# Patient Record
Sex: Female | Born: 1985 | Race: Black or African American | Hispanic: No | Marital: Single | Smoking: Never smoker
Health system: Southern US, Community
[De-identification: ages and names within clinical notes are randomized; demographics above are authoritative.]

## PROBLEM LIST (undated history)

## (undated) DIAGNOSIS — E611 Iron deficiency: Secondary | ICD-10-CM

## (undated) DIAGNOSIS — R7989 Other specified abnormal findings of blood chemistry: Secondary | ICD-10-CM

## (undated) DIAGNOSIS — R945 Abnormal results of liver function studies: Secondary | ICD-10-CM

## (undated) DIAGNOSIS — R768 Other specified abnormal immunological findings in serum: Secondary | ICD-10-CM

## (undated) DIAGNOSIS — D561 Beta thalassemia: Secondary | ICD-10-CM

## (undated) DIAGNOSIS — M3500 Sicca syndrome, unspecified: Secondary | ICD-10-CM

## (undated) HISTORY — DX: Sjogren syndrome, unspecified: M35.00

## (undated) HISTORY — DX: Other specified abnormal immunological findings in serum: R76.8

## (undated) HISTORY — PX: WISDOM TOOTH EXTRACTION: SHX21

## (undated) HISTORY — DX: Other specified abnormal findings of blood chemistry: R79.89

## (undated) HISTORY — DX: Abnormal results of liver function studies: R94.5

## (undated) HISTORY — DX: Beta thalassemia: D56.1

## (undated) HISTORY — DX: Iron deficiency: E61.1

---
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
## 2005-07-22 ENCOUNTER — Emergency Department (HOSPITAL_COMMUNITY): Admission: EM | Admit: 2005-07-22 | Discharge: 2005-07-22 | Payer: Self-pay | Admitting: Emergency Medicine

## 2005-12-14 ENCOUNTER — Emergency Department (HOSPITAL_COMMUNITY): Admission: EM | Admit: 2005-12-14 | Discharge: 2005-12-14 | Payer: Self-pay | Admitting: Emergency Medicine

## 2006-07-09 ENCOUNTER — Ambulatory Visit: Payer: Self-pay | Admitting: *Deleted

## 2006-07-09 ENCOUNTER — Inpatient Hospital Stay (HOSPITAL_COMMUNITY): Admission: AD | Admit: 2006-07-09 | Discharge: 2006-07-09 | Payer: Self-pay | Admitting: Obstetrics and Gynecology

## 2006-07-12 ENCOUNTER — Ambulatory Visit: Payer: Self-pay | Admitting: *Deleted

## 2006-07-12 ENCOUNTER — Inpatient Hospital Stay (HOSPITAL_COMMUNITY): Admission: AD | Admit: 2006-07-12 | Discharge: 2006-07-12 | Payer: Self-pay | Admitting: Gynecology

## 2006-10-29 ENCOUNTER — Inpatient Hospital Stay (HOSPITAL_COMMUNITY): Admission: AD | Admit: 2006-10-29 | Discharge: 2006-10-29 | Payer: Self-pay | Admitting: Family Medicine

## 2006-11-06 ENCOUNTER — Inpatient Hospital Stay (HOSPITAL_COMMUNITY): Admission: AD | Admit: 2006-11-06 | Discharge: 2006-11-08 | Payer: Self-pay | Admitting: Obstetrics and Gynecology

## 2007-11-22 ENCOUNTER — Emergency Department (HOSPITAL_COMMUNITY): Admission: EM | Admit: 2007-11-22 | Discharge: 2007-11-22 | Payer: Self-pay | Admitting: Emergency Medicine

## 2008-02-28 ENCOUNTER — Emergency Department (HOSPITAL_COMMUNITY): Admission: EM | Admit: 2008-02-28 | Discharge: 2008-02-28 | Payer: Self-pay | Admitting: Emergency Medicine

## 2008-05-06 ENCOUNTER — Emergency Department (HOSPITAL_COMMUNITY): Admission: EM | Admit: 2008-05-06 | Discharge: 2008-05-06 | Payer: Self-pay | Admitting: Emergency Medicine

## 2008-08-28 ENCOUNTER — Emergency Department (HOSPITAL_COMMUNITY): Admission: EM | Admit: 2008-08-28 | Discharge: 2008-08-28 | Payer: Self-pay | Admitting: Emergency Medicine

## 2008-12-13 ENCOUNTER — Emergency Department (HOSPITAL_BASED_OUTPATIENT_CLINIC_OR_DEPARTMENT_OTHER): Admission: EM | Admit: 2008-12-13 | Discharge: 2008-12-13 | Payer: Self-pay | Admitting: Emergency Medicine

## 2009-01-30 ENCOUNTER — Emergency Department (HOSPITAL_BASED_OUTPATIENT_CLINIC_OR_DEPARTMENT_OTHER): Admission: EM | Admit: 2009-01-30 | Discharge: 2009-01-30 | Payer: Self-pay | Admitting: Emergency Medicine

## 2009-05-31 ENCOUNTER — Emergency Department (HOSPITAL_COMMUNITY): Admission: EM | Admit: 2009-05-31 | Discharge: 2009-05-31 | Payer: Self-pay | Admitting: Emergency Medicine

## 2009-08-08 ENCOUNTER — Emergency Department (HOSPITAL_BASED_OUTPATIENT_CLINIC_OR_DEPARTMENT_OTHER): Admission: EM | Admit: 2009-08-08 | Discharge: 2009-08-08 | Payer: Self-pay | Admitting: Emergency Medicine

## 2009-10-30 ENCOUNTER — Inpatient Hospital Stay (HOSPITAL_COMMUNITY): Admission: AD | Admit: 2009-10-30 | Discharge: 2009-10-30 | Payer: Self-pay | Admitting: Obstetrics and Gynecology

## 2009-12-12 LAB — HM PAP SMEAR: HM Pap smear: NEGATIVE

## 2010-06-24 ENCOUNTER — Inpatient Hospital Stay (HOSPITAL_COMMUNITY): Admission: AD | Admit: 2010-06-24 | Discharge: 2010-06-26 | Payer: Self-pay | Admitting: Obstetrics and Gynecology

## 2010-09-13 ENCOUNTER — Emergency Department (HOSPITAL_COMMUNITY): Admission: EM | Admit: 2010-09-13 | Discharge: 2010-09-13 | Payer: Self-pay | Admitting: Emergency Medicine

## 2011-01-26 LAB — CBC
Hemoglobin: 11.6 g/dL — ABNORMAL LOW (ref 12.0–15.0)
MCH: 23.4 pg — ABNORMAL LOW (ref 26.0–34.0)
MCH: 23.5 pg — ABNORMAL LOW (ref 26.0–34.0)
MCHC: 32.1 g/dL (ref 30.0–36.0)
MCHC: 32.5 g/dL (ref 30.0–36.0)
MCV: 72 fL — ABNORMAL LOW (ref 78.0–100.0)
Platelets: 252 10*3/uL (ref 150–400)
Platelets: 256 10*3/uL (ref 150–400)
RBC: 4.74 MIL/uL (ref 3.87–5.11)
RDW: 15.7 % — ABNORMAL HIGH (ref 11.5–15.5)
RDW: 15.7 % — ABNORMAL HIGH (ref 11.5–15.5)

## 2011-01-26 LAB — RPR: RPR Ser Ql: NONREACTIVE

## 2011-02-12 LAB — GC/CHLAMYDIA PROBE AMP, GENITAL
Chlamydia, DNA Probe: NEGATIVE
GC Probe Amp, Genital: NEGATIVE

## 2011-02-12 LAB — CBC
Hemoglobin: 11.3 g/dL — ABNORMAL LOW (ref 12.0–15.0)
MCHC: 32.4 g/dL (ref 30.0–36.0)
Platelets: 291 10*3/uL (ref 150–400)
RDW: 15 % (ref 11.5–15.5)

## 2011-02-12 LAB — WET PREP, GENITAL: Clue Cells Wet Prep HPF POC: NONE SEEN

## 2011-02-12 LAB — ABO/RH: ABO/RH(D): O POS

## 2011-02-12 LAB — POCT PREGNANCY, URINE: Preg Test, Ur: POSITIVE

## 2011-02-16 LAB — CBC
HCT: 36.2 % (ref 36.0–46.0)
MCV: 68.7 fL — ABNORMAL LOW (ref 78.0–100.0)
Platelets: 309 10*3/uL (ref 150–400)
RDW: 14.4 % (ref 11.5–15.5)

## 2011-02-16 LAB — URINALYSIS, ROUTINE W REFLEX MICROSCOPIC
Glucose, UA: NEGATIVE mg/dL
Protein, ur: NEGATIVE mg/dL
Specific Gravity, Urine: 1.028 (ref 1.005–1.030)
Urobilinogen, UA: 1 mg/dL (ref 0.0–1.0)

## 2011-02-16 LAB — DIFFERENTIAL
Basophils Absolute: 0.1 10*3/uL (ref 0.0–0.1)
Basophils Relative: 2 % — ABNORMAL HIGH (ref 0–1)
Eosinophils Relative: 1 % (ref 0–5)
Lymphocytes Relative: 28 % (ref 12–46)
Monocytes Relative: 6 % (ref 3–12)
Neutro Abs: 3.8 10*3/uL (ref 1.7–7.7)

## 2011-02-16 LAB — COMPREHENSIVE METABOLIC PANEL
Albumin: 4.3 g/dL (ref 3.5–5.2)
BUN: 9 mg/dL (ref 6–23)
Creatinine, Ser: 0.7 mg/dL (ref 0.4–1.2)
GFR calc Af Amer: >60 mL/min (ref >60)
Total Protein: 8 g/dL (ref 6.0–8.3)

## 2011-02-16 LAB — URINE MICROSCOPIC-ADD ON

## 2011-02-16 LAB — WET PREP, GENITAL

## 2011-02-16 LAB — GC/CHLAMYDIA PROBE AMP, GENITAL
Chlamydia, DNA Probe: NEGATIVE
GC Probe Amp, Genital: NEGATIVE

## 2011-02-22 LAB — COMPREHENSIVE METABOLIC PANEL
ALT: 47 U/L — ABNORMAL HIGH (ref 0–35)
BUN: 8 mg/dL (ref 6–23)
Calcium: 8.9 mg/dL (ref 8.4–10.5)
Glucose, Bld: 80 mg/dL (ref 70–99)
Sodium: 140 mEq/L (ref 135–145)
Total Protein: 8 g/dL (ref 6.0–8.3)

## 2011-02-22 LAB — GC/CHLAMYDIA PROBE AMP, GENITAL
Chlamydia, DNA Probe: NEGATIVE
GC Probe Amp, Genital: NEGATIVE

## 2011-02-22 LAB — URINALYSIS, ROUTINE W REFLEX MICROSCOPIC
Bilirubin Urine: NEGATIVE
Ketones, ur: NEGATIVE mg/dL
Nitrite: NEGATIVE
pH: 6.5 (ref 5.0–8.0)

## 2011-02-22 LAB — CBC
Hemoglobin: 12.1 g/dL (ref 12.0–15.0)
MCHC: 32.2 g/dL (ref 30.0–36.0)
RDW: 14.3 % (ref 11.5–15.5)

## 2011-02-22 LAB — DIFFERENTIAL
Lymphocytes Relative: 18 % (ref 12–46)
Lymphs Abs: 1.2 10*3/uL (ref 0.7–4.0)
Monocytes Relative: 5 % (ref 3–12)
Neutro Abs: 5.1 10*3/uL (ref 1.7–7.7)
Neutrophils Relative %: 76 % (ref 43–77)

## 2011-02-22 LAB — LIPASE, BLOOD: Lipase: 25 U/L (ref 23–300)

## 2011-02-22 LAB — WET PREP, GENITAL

## 2011-02-22 LAB — PREGNANCY, URINE: Preg Test, Ur: NEGATIVE

## 2011-08-02 LAB — URINALYSIS, ROUTINE W REFLEX MICROSCOPIC
Glucose, UA: NEGATIVE
Nitrite: NEGATIVE
Protein, ur: NEGATIVE
pH: 6

## 2011-08-02 LAB — URINE MICROSCOPIC-ADD ON

## 2011-08-14 LAB — URINE MICROSCOPIC-ADD ON

## 2011-08-14 LAB — URINALYSIS, ROUTINE W REFLEX MICROSCOPIC
Bilirubin Urine: NEGATIVE
Glucose, UA: NEGATIVE
Hgb urine dipstick: NEGATIVE
Specific Gravity, Urine: 1.04 — ABNORMAL HIGH
Urobilinogen, UA: 0.2

## 2011-08-14 LAB — POCT I-STAT, CHEM 8
BUN: 10
Calcium, Ion: 1.18
Chloride: 106
Creatinine, Ser: 0.8
Glucose, Bld: 105 — ABNORMAL HIGH
Potassium: 3.6

## 2011-08-14 LAB — HEPATIC FUNCTION PANEL
ALT: 43 — ABNORMAL HIGH
AST: 26
Alkaline Phosphatase: 48
Bilirubin, Direct: <0.1

## 2011-08-14 LAB — URINE CULTURE

## 2011-08-14 LAB — PREGNANCY, URINE: Preg Test, Ur: NEGATIVE

## 2012-01-04 DIAGNOSIS — D561 Beta thalassemia: Secondary | ICD-10-CM | POA: Insufficient documentation

## 2012-01-14 ENCOUNTER — Telehealth: Payer: Self-pay | Admitting: Oncology

## 2012-01-14 NOTE — Telephone Encounter (Signed)
called pts scheduled appt pt for 03/21.  will fax a letter to Dr. Daphine Deutscher with appt d/t

## 2012-01-15 ENCOUNTER — Telehealth: Payer: Self-pay | Admitting: Oncology

## 2012-01-15 NOTE — Telephone Encounter (Signed)
Del. Ref.Cameron Sprang NP Dx.Poss. Thalassemia

## 2012-01-24 ENCOUNTER — Encounter: Payer: Self-pay | Admitting: Oncology

## 2012-01-24 DIAGNOSIS — M3500 Sicca syndrome, unspecified: Secondary | ICD-10-CM | POA: Insufficient documentation

## 2012-01-24 DIAGNOSIS — D561 Beta thalassemia: Secondary | ICD-10-CM | POA: Insufficient documentation

## 2012-01-24 DIAGNOSIS — E559 Vitamin D deficiency, unspecified: Secondary | ICD-10-CM | POA: Insufficient documentation

## 2012-01-24 DIAGNOSIS — R7989 Other specified abnormal findings of blood chemistry: Secondary | ICD-10-CM | POA: Insufficient documentation

## 2012-01-24 DIAGNOSIS — G43909 Migraine, unspecified, not intractable, without status migrainosus: Secondary | ICD-10-CM | POA: Insufficient documentation

## 2012-01-24 DIAGNOSIS — R768 Other specified abnormal immunological findings in serum: Secondary | ICD-10-CM | POA: Insufficient documentation

## 2012-01-31 ENCOUNTER — Ambulatory Visit (HOSPITAL_BASED_OUTPATIENT_CLINIC_OR_DEPARTMENT_OTHER): Payer: Medicaid Other | Admitting: Lab

## 2012-01-31 ENCOUNTER — Encounter: Payer: Self-pay | Admitting: Oncology

## 2012-01-31 ENCOUNTER — Ambulatory Visit: Payer: Self-pay

## 2012-01-31 ENCOUNTER — Ambulatory Visit (HOSPITAL_BASED_OUTPATIENT_CLINIC_OR_DEPARTMENT_OTHER): Payer: Medicaid Other | Admitting: Oncology

## 2012-01-31 DIAGNOSIS — E611 Iron deficiency: Secondary | ICD-10-CM

## 2012-01-31 DIAGNOSIS — R7989 Other specified abnormal findings of blood chemistry: Secondary | ICD-10-CM

## 2012-01-31 DIAGNOSIS — R768 Other specified abnormal immunological findings in serum: Secondary | ICD-10-CM

## 2012-01-31 DIAGNOSIS — Z862 Personal history of diseases of the blood and blood-forming organs and certain disorders involving the immune mechanism: Secondary | ICD-10-CM

## 2012-01-31 DIAGNOSIS — G43909 Migraine, unspecified, not intractable, without status migrainosus: Secondary | ICD-10-CM

## 2012-01-31 DIAGNOSIS — D561 Beta thalassemia: Secondary | ICD-10-CM

## 2012-01-31 LAB — CBC WITH DIFFERENTIAL/PLATELET
Basophils Absolute: 0 10*3/uL (ref 0.0–0.1)
EOS%: 1.1 % (ref 0.0–7.0)
Eosinophils Absolute: 0 10*3/uL (ref 0.0–0.5)
LYMPH%: 33 % (ref 14.0–49.7)
MCH: 21.6 pg — ABNORMAL LOW (ref 25.1–34.0)
MCV: 66.7 fL — ABNORMAL LOW (ref 79.5–101.0)
MONO%: 6.5 % (ref 0.0–14.0)
Platelets: 288 10*3/uL (ref 145–400)
RBC: 5.46 10*6/uL — ABNORMAL HIGH (ref 3.70–5.45)
RDW: 14.6 % — ABNORMAL HIGH (ref 11.2–14.5)
nRBC: 0 % (ref 0–0)

## 2012-01-31 LAB — MORPHOLOGY: PLT EST: ADEQUATE

## 2012-01-31 LAB — CHCC SMEAR

## 2012-01-31 NOTE — Progress Notes (Signed)
[facility] INITIAL HEMATOLOGY CONSULTATION  Referral MD:  Caprice Kluver, F.N.P.   Reason for Referral: beta thal.     HPI: [name] is a 26 year-old [place] American woman with family and personal history of beta thal.  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  She has never required pRBC transfusion.  She was in [facility] until 08/2011 when she developed fatigue, fever, nausea/vomiting.  She was seen by her PCP and  LFT was obtained which showed transaminitis.  She had extensive work up which was reportedly negative for hepatitis, normal abdominal US.  Her symptoms eventually resolved.  She just recently told her PCP about her diagnosis of beta thal.  Given this history and recent abnormal LFT, she was kindly referred for evaluation.  [name] presented to clinic by herself today.  She reported mild fatigue which has improved compared to 08/2011.  She is still able to take care of two young children and go to school.  She has intermittent dizziness.   Patient denies headache, visual changes, confusion, drenching night sweats, palpable lymph node swelling, mucositis, odynophagia, dysphagia, nausea vomiting, jaundice, chest pain, palpitation, shortness of breath, dyspnea on exertion, productive cough, gum bleeding, epistaxis, hematemesis, hemoptysis, abdominal pain, abdominal swelling, early satiety, melena, hematochezia, hematuria, skin rash, jaundice, spontaneous bleeding, joint swelling, joint pain, heat or cold intolerance, bowel bladder incontinence, back pain, focal motor weakness, paresthesia, depression, suicidal or homocidal ideation, feeling hopelessness.  Past Medical History  Diagnosis Date  . Migraine   . Vitamin d deficiency   . Beta thalassemia   . LFT elevation     unclear etiology; negative work up in 08/2011.   Marland Kitchen Positive ANA (antinuclear antibody)   Her positive ANA resolved when she was evaluated by a rheumatologist.      Past  Surgical History  Procedure Date  . Wisdom tooth extraction   :   CURRENT MEDS: Current Outpatient Prescriptions  Medication Sig Dispense Refill  . aspirin-acetaminophen-caffeine (EXCEDRIN MIGRAINE) 250-250-65 MG per tablet Take 1 tablet by mouth every 6 (six) hours as needed.      Marland Kitchen ibuprofen (ADVIL,MOTRIN) 200 MG tablet Take 200 mg by mouth every 6 (six) hours as needed.      Marland Kitchen buPROPion (WELLBUTRIN SR) 100 MG 12 hr tablet Take 100 mg by mouth 2 (two) times daily.          Allergies  Allergen Reactions  . Percocet (Oxycodone-Acetaminophen) Rash  :  Family History  Problem Relation Age of Onset  . Thalassemia Mother   . Thalassemia Brother   . Cancer Maternal Grandmother     cervical cancer  :  History   Social History  . Marital Status: Single    Spouse Name: N/A    Number of Children: 2  . Years of Education: N/A   Occupational History  .      in school for [employer]   Social History Main Topics  . Smoking status: Never Smoker   . Smokeless tobacco: Never Used  . Alcohol Use: No  . Drug Use: No  . Sexually Active: No   Other Topics Concern  . Not on file   Social History Narrative  . No narrative on file    REVIEW OF SYSTEM:  The rest of the 14-point review of sytem was negative.   Exam:   General:  well-nourished in no acute distress.  Eyes:  no scleral icterus.  ENT:  There  were no oropharyngeal lesions.  Neck was without thyromegaly.  Lymphatics:  Negative cervical, supraclavicular or axillary adenopathy.  Respiratory: lungs were clear bilaterally without wheezing or crackles.  Cardiovascular:  Regular rate and rhythm, S1/S2, without murmur, rub or gallop.  There was no pedal edema.  GI:  abdomen was soft, flat, nontender, nondistended, without organomegaly.  Muscoloskeletal:  no spinal tenderness of palpation of vertebral spine.  Skin exam was without echymosis, petichae.  Neuro exam was nonfocal.  Patient was able to get on and off exam table  without assistance.  Gait was normal.  Patient was alerted and oriented.  Attention was good.   Language was appropriate.  Mood was normal without depression.  Speech was not pressured.  Thought content was not tangential.    LABS:  Lab Results  Component Value Date   WBC 3.7* 01/31/2012   HGB 11.8 01/31/2012   HCT 36.4 01/31/2012   PLT 288 01/31/2012   GLUCOSE 83 01/31/2012   ALT 10 01/31/2012   AST 18 01/31/2012   NA 138 01/31/2012   K 4.1 01/31/2012   CL 104 01/31/2012   CREATININE 0.65 01/31/2012   BUN 10 01/31/2012   CO2 26 01/31/2012   Blood smear review:   I personally reviewed the patient's peripheral blood smear today.  There was microcytosis and increased central pallor.  There was no peripheral blast.  There was no schistocytosis, spherocytosis, target cell, rouleaux formation, tear drop cell.  There was no giant platelets or platelet clumps.     ASSESSMENT AND PLAN:   1.  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    Diagnosis: She does not remember the doctor who gave her this diagnosis, nor does she have record of past testing.  Therefore, I sent for hemoglobin electrophoresis today to ensure that she has this disease.    Treatment:  No specific treatment is indicated in patient with beta thal minor who has no pRBC transfusion requirement.  2.  Recent abnormal LFT:  Patients with beta thal intermedia (symptomatic betal thal  who does not requires pRBC transfusion) can still develop iron overload due to increased absorption.  Her LFT abnormality has resolved.  However, in patients with beta thal trait/carrier, iron overload  is often not the case.   Work up:  I sent for iron panel today to ensure that she does not have iron overload.   Recommend:  To PCP to follow yearly LFT and iron panel.  If she develops iron overload, then she needs to be referred back to Hematology for iron chelation therapy.  If LFT becomes  abnormal again, then she needs to be refer to Liver clinic.  Without LFT abnormality, there is no indication for surveillance liver US.  If she develops cirrhosis, then she will need to be monitored with yearly liver US to screen for hepatocellular carcinoma.    3.  History of iron deficiency but no current anemia:  from most likely menstrual blood loss.  She denies dysmenorrhea.  I have iron panel pending today.  4.  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  She was discharged back to PCP.  Our service is available in the future if the need arises.   Thank you for this opportunity for me to participate in this patient's care.

## 2012-01-31 NOTE — Progress Notes (Signed)
Patient came in today as a new patient,I met with her,and told her about our financial assistance program,but she said she was [place] because she has Medicaid.

## 2012-02-01 ENCOUNTER — Other Ambulatory Visit: Payer: Self-pay | Admitting: Oncology

## 2012-02-01 DIAGNOSIS — R768 Other specified abnormal immunological findings in serum: Secondary | ICD-10-CM

## 2012-02-01 DIAGNOSIS — D561 Beta thalassemia: Secondary | ICD-10-CM

## 2012-02-01 DIAGNOSIS — E611 Iron deficiency: Secondary | ICD-10-CM

## 2012-02-01 DIAGNOSIS — R7989 Other specified abnormal findings of blood chemistry: Secondary | ICD-10-CM

## 2012-02-02 LAB — IRON AND TIBC
%SAT: 17 % — ABNORMAL LOW (ref 20–55)
Iron: 51 ug/dL (ref 42–145)
TIBC: 292 ug/dL (ref 250–470)
UIBC: 241 ug/dL (ref 125–400)

## 2012-02-04 LAB — COMPREHENSIVE METABOLIC PANEL
ALT: 10 U/L (ref 0–35)
AST: 18 U/L (ref 0–37)
Albumin: 4.4 g/dL (ref 3.5–5.2)
CO2: 26 mEq/L (ref 19–32)
Calcium: 9.3 mg/dL (ref 8.4–10.5)
Chloride: 104 mEq/L (ref 96–112)
Creatinine, Ser: 0.65 mg/dL (ref 0.50–1.10)
Potassium: 4.1 mEq/L (ref 3.5–5.3)

## 2012-02-04 LAB — FERRITIN: Ferritin: 184 ng/mL (ref 10–291)

## 2014-01-03 ENCOUNTER — Emergency Department (HOSPITAL_COMMUNITY): Payer: Medicaid Other

## 2014-01-03 ENCOUNTER — Emergency Department (HOSPITAL_COMMUNITY)
Admission: EM | Admit: 2014-01-03 | Discharge: 2014-01-03 | Disposition: A | Discharge status: Home / Self Care | Payer: Medicaid Other | Attending: Emergency Medicine | Admitting: Emergency Medicine

## 2014-01-03 ENCOUNTER — Encounter (HOSPITAL_COMMUNITY): Payer: Self-pay | Admitting: Emergency Medicine

## 2014-01-03 DIAGNOSIS — G43909 Migraine, unspecified, not intractable, without status migrainosus: Secondary | ICD-10-CM | POA: Insufficient documentation

## 2014-01-03 DIAGNOSIS — Z8639 Personal history of other endocrine, nutritional and metabolic disease: Secondary | ICD-10-CM | POA: Insufficient documentation

## 2014-01-03 DIAGNOSIS — M47817 Spondylosis without myelopathy or radiculopathy, lumbosacral region: Secondary | ICD-10-CM | POA: Insufficient documentation

## 2014-01-03 DIAGNOSIS — M4306 Spondylolysis, lumbar region: Secondary | ICD-10-CM

## 2014-01-03 DIAGNOSIS — Z862 Personal history of diseases of the blood and blood-forming organs and certain disorders involving the immune mechanism: Secondary | ICD-10-CM | POA: Insufficient documentation

## 2014-01-03 DIAGNOSIS — Z79899 Other long term (current) drug therapy: Secondary | ICD-10-CM | POA: Insufficient documentation

## 2014-01-03 DIAGNOSIS — R209 Unspecified disturbances of skin sensation: Secondary | ICD-10-CM | POA: Insufficient documentation

## 2014-01-03 DIAGNOSIS — IMO0002 Reserved for concepts with insufficient information to code with codable children: Secondary | ICD-10-CM | POA: Insufficient documentation

## 2014-01-03 DIAGNOSIS — Z791 Long term (current) use of non-steroidal anti-inflammatories (NSAID): Secondary | ICD-10-CM | POA: Insufficient documentation

## 2014-01-03 DIAGNOSIS — M79609 Pain in unspecified limb: Secondary | ICD-10-CM | POA: Insufficient documentation

## 2014-01-03 MED ORDER — PREDNISONE 10 MG PO TABS: 20.0000 mg | ORAL_TABLET | Freq: Every day | ORAL | Status: DC

## 2014-01-03 MED ORDER — CYCLOBENZAPRINE HCL 10 MG PO TABS: 10.0000 mg | ORAL_TABLET | Freq: Two times a day (BID) | ORAL | Status: DC | PRN

## 2014-01-03 MED ORDER — KETOROLAC TROMETHAMINE 60 MG/2ML IM SOLN
60.0000 mg | Freq: Once | INTRAMUSCULAR | Status: AC
  Administered 2014-01-03: 60 mg via INTRAMUSCULAR
  Filled 2014-01-03: qty 2

## 2014-01-03 NOTE — ED Notes (Signed)
Pt c/o constant pain to right leg and intermittent low back pain ongoing for 1 week. Pt denies injury.

## 2014-01-03 NOTE — Discharge Instructions (Signed)
Spondylolysis with Rehab Spondylolysis is a condition of the spine that is characterized by stress fracture of one or more vertebrae. These stress fractures occur in an area of the vertebrae between the facet joints (pons inter-articularis). Facet joints are what enable the spine to move and pivot.  SYMPTOMS   Dull, achy pain in the lower back. Pain that worsens with extension of the spine.  Tightness of the muscles on the back of the thigh.  Lower back stiffness. CAUSES  Stress fractures occur when repetitive stress damages a bone faster than the bone can heal. Common mechanisms of injury for spondylolysis include:  Repetitive extension beyond normal limits of the spine (hyperextension).  Repetitive excessive rotation of the spine.  Direct trauma (uncommon). RISK INCREASES WITH:  Activities that have a risk of hyper-extending the back.  Activities that have a risk of excessively rotating the spine.  Poor strength and flexibility  Failure to warm-up properly before activity.  Family history of spondylolysis or slippage or movement of one vertebra on another (spondylolisthesis).  Improper sports technique. PREVENTION  Use proper technique.  Wear proper protective equipment and ensure correct fit.  Appropriately warm up and stretch before practice or competition.  Maintain appropriate conditioning:  Back and hamstring flexibility.  Back muscle strength and endurance.  Cardiovascular fitness. PROGNOSIS  If treated properly, then spondylolysis usually heals within 6 weeks of non-surgical (conservative) treatment. RELATED COMPLICATIONS   Recurrent symptoms that result in a chronic problem.  Inability to compete in athletics.  Prolonged healing time, if improperly treated or re-injured.  Failure of the fracture to heal (nonunion)  Healing of the fracture in a poor position (malunion).  Progression to spondylolisthesis. TREATMENT  Treatment initially involves  resting from any activities that aggravate the symptoms, and the use of ice and medications to help reduce pain and inflammation. The use of strengthening and stretching exercises may help reduce pain with activity. These exercises may be performed at home or with referral to a therapist. It is important to learn how to use proper body mechanics so undue stress is not placed on your spine. If the injury is severe, then your caregiver may recommend a back brace to allow for healing or even surgery. Surgery often involves fusing two adjacent vertebrae, so no movement is allowed between them. Surgery is rarely performed for this condition; however, if symptoms persist for greater than 12 months despite non-surgical (conservative) treatment, then surgery may be recommended. MEDICATION   If pain medication is necessary, then nonsteroidal anti-inflammatory medications, such as aspirin and ibuprofen, or other minor pain relievers, such as acetaminophen, are often recommended.  Do not take pain medication for 7 days before surgery.  Prescription pain relievers may be given if deemed necessary by your caregiver. Use only as directed and only as much as you need. HEAT AND COLD:  Cold treatment (icing) relieves pain and reduces inflammation. Cold treatment should be applied for 10 to 15 minutes every 2 to 3 hours for inflammation and pain and immediately after any activity that aggravates your symptoms. Use ice packs or massage the area with a piece of ice (ice massage).  Heat treatment may be used prior to performing the stretching and strengthening activities prescribed by your caregiver, physical therapist, or athletic trainer. Use a heat pack or soak your injury in warm water. SEEK MEDICAL CARE IF:  Treatment seems to offer no benefit, or the condition worsens.  Any medications produce adverse side effects. EXERCISES RANGE OF MOTION (ROM) AND  STRETCHING EXERCISES - Spondylolysis Most people with low  back pain will find that their symptoms worsen with either excessive bending forward (flexion) or arching at the low back (extension). The exercises which will help resolve your symptoms will focus on the opposite motion. Your physician, physical therapist or athletic trainer will help you determine which exercises will be most helpful to resolve your low back pain. Do not complete any exercises without first consulting with your clinician. Discontinue any exercises which worsen your symptoms until you speak to your clinician. You may have pain, numbness or tingling which travels down into your buttocks, leg or foot.  The goal of the therapy is to get these symptoms to recede to your back and eventually resolve. Occasionally, these leg symptoms will get better, but your low back pain may worsen; this is typically an indication of progress in your rehabilitation. Be certain to be very alert to any changes in your symptoms and the activities in which you participated in the 24 hours prior to the change. Sharing this information with your clinician will allow him/her to most efficiently treat your condition. These exercises may help you when beginning to rehabilitate your injury. Your symptoms may resolve with or without further involvement from your physician, physical therapist or athletic trainer. While completing these exercises, remember:   Restoring tissue flexibility helps normal motion to return to the joints. This allows healthier, less painful movement and activity.  An effective stretch should be held for at least 30 seconds.  A stretch should never be painful. You should only feel a gentle lengthening or release in the stretched tissue. FLEXION RANGE OF MOTION AND STRETCHING EXERCISES: STRETCH  Flexion, Single Knee to Chest  Lie on a firm bed or the floor with both legs extended in front of you.  Keeping one leg in contact with the floor, bring your opposite knee to your chest. Hold your leg  in place by either grabbing behind your thigh or at your knee.  Pull until you feel a gentle stretch in your low back. Hold __________ seconds.  Slowly release your grasp and repeat the exercise with the opposite side. Repeat __________ times. Complete this exercise __________ times per day.  STRETCH  Flexion, Double Knee to Chest  Lie on a firm bed or the floor with both legs extended in front of you.  Keeping one leg in contact with the floor, bring your opposite knee to your chest.  Tense your stomach muscles to support your back and then lift your other knee to your chest. Hold your legs in place by either grabbing behind your thighs or at your knees.  Pull both knees toward your chest until you feel a gentle stretch in your low back. Hold __________ seconds.  Tense your stomach muscles and slowly return one leg at a time to the floor. Repeat __________ times. Complete this exercise __________ times per day.  STRENGTHENING EXERCISES - Spondylolysis These exercises may help you when beginning to rehabilitate your injury. These exercises should be done near your "sweet spot." This is the neutral, low-back arch, somewhere between fully rounded and fully arched, that is your least painful position. When performed in this safe range of motion, these exercises can be used for people who have either a flexion or extension based injury. These exercises may resolve your symptoms with or without further involvement from your physician, physical therapist or athletic trainer. While completing these exercises, remember:   Muscles can gain both the endurance and  the strength needed for everyday activities through controlled exercises.  Complete these exercises as instructed by your physician, physical therapist or athletic trainer. Progress with the resistance and repetition exercises only as your caregiver advises.  You may experience muscle soreness or fatigue, but the pain or discomfort you are  trying to eliminate should never worsen during these exercises. If this pain does worsen, stop and make certain you are following the directions exactly. If the pain is still present after adjustments, discontinue the exercise until you can discuss the trouble with your clinician. STRENGTHENING Deep Abdominals, Pelvic Tilt   Lie on a firm bed or the floor. Keeping your legs in front of you, bend your knees so they are both pointed toward the ceiling and your feet are flat on the floor.  Tense your lower abdominal muscles to press your low back into the floor. This motion will rotate your pelvis so that your tail bone is scooping upwards rather than pointing at your feet or into the floor.  With a gentle tension and even breathing, hold this position for __________ seconds. Repeat __________ times. Complete this exercise __________ times per day.  STRENGTHENING  Abdominals, Crunches  Lie on a firm bed or the floor. Keeping your legs in front of you, bend your knees so they are both pointed toward the ceiling and your feet are flat on the floor. Cross your arms over your chest.  Slightly tip your chin down without bending your neck.  Tense your abdominals and slowly lift your trunk high enough to just clear your shoulder blades. Lifting higher can put excessive stress on the low back and does not further strengthen your abdominal muscles.  Control your return to the starting position. Repeat __________ times. Complete this exercise __________ times per day.  STRENGTHENING  Quadruped, Opposite UE/LE Lift  Assume a hands and knees position on a firm surface. Keep your hands under your shoulders and your knees under your hips. You may place padding under your knees for comfort.  Find your neutral spine and gently tense your abdominal muscles so that you can maintain this position. Your shoulders and hips should form a rectangle that is parallel with the floor and is not twisted.  Keeping your  trunk steady, lift your right hand no higher than your shoulder and then your left leg no higher than your hip. Make sure you are not holding your breath. Hold this position __________ seconds.  Continuing to keep your abdominal muscles tense and your back steady, slowly return to your starting position. Repeat with the opposite arm and leg. Repeat __________ times. Complete this exercise __________ times per day.  STRENGTHENING  Lower Abdominals, Double Knee Lift  Lie on a firm bed or the floor. Keeping your legs in front of you, bend your knees so they are both pointed toward the ceiling and your feet are flat on the floor.  Tense your abdominal muscles to brace your low back and slowly lift both of your knees until they come over your hips. Be certain not to hold your breath.  Hold __________ seconds. Using your abdominal muscles, return to the starting position in a slow and controlled manner. Repeat __________ times. Complete this exercise __________ times per day.  POSTURE AND BODY MECHANICS CONSIDERATIONS - Spondylolysis Keeping correct posture when sitting, standing or completing your activities will reduce the stress put on different body tissues, allowing injured tissues a chance to heal and limiting painful experiences. The following are general guidelines  for improved posture. Your physician or physical therapist will provide you with any instructions specific to your needs. While reading these guidelines, remember:  The exercises prescribed by your provider will help you have the flexibility and strength to maintain correct postures.  The correct posture provides the optimal environment for your joints to work. All of your joints have less wear and tear when properly supported by a spine with good posture. This means you will experience a healthier, less painful body.  Correct posture must be practiced with all of your activities, especially prolonged sitting and standing. Correct  posture is as important when doing repetitive low-stress activities (typing) as it is when doing a single heavy-load activity (lifting). PROPER SITTING POSTURE In order to minimize stress and discomfort on your spine, you must sit with correct posture. Sitting with good posture should be effortless for a healthy body. Returning to good posture is a gradual process. Many people can work toward this most comfortably by using various supports until they have the flexibility and strength to maintain this posture on their own. When sitting with proper posture, your ears will fall over your shoulders and your shoulders will fall over your hips. You should use the back of the chair to support your upper back. Your low back will be in a neutral position, just slightly arched. You may place a small pillow or folded towel at the base of your low back for support.  When working at a desk, create an environment that supports good, upright posture. Without extra support, muscles fatigue and lead to excessive strain on joints and other tissues. Keep these recommendations in mind: CHAIR:  A chair should be able to slide under your desk when your back makes contact with the back of the chair. This allows you to work closely.  The chair's height should allow your eyes to be level with the upper part of your monitor and your hands to be slightly lower than your elbows. BODY POSITION  Your feet should make contact with the floor. If this is not possible, use a foot rest.  Keep your ears over your shoulders. This will reduce stress on your neck and low back. INCORRECTSITTING POSTURES If you are feeling tired and unable to assume a healthy sitting posture, do not slouch or slump. This puts excessive strain on your back tissues, causing more damage and pain. Healthier options include:  Using more support, like a lumbar pillow.  Switching tasks to something that requires you to be upright or walking.  Talking a brief  walk.  Lying down to rest in a neutral-spine position. CORRECT LIFTING TECHNIQUES DO :   Assume a wide stance. This will provide you more stability and the opportunity to get as close as possible to the object which you are lifting.  Tense your abdominals to brace your spine; then bend at the knees and hips. Keeping your back locked in a neutral-spine position, lift using your leg muscles. Lift with your legs, keeping your back straight.  Test the weight of unknown objects before attempting to lift them.  Try to keep your elbows locked down at your sides in order get the best strength from your shoulders when carrying an object.  Always ask for help when lifting heavy or awkward objects. INCORRECT LIFTING TECHNIQUES DO NOT:   Lock your knees when lifting, even if it is a small object.  Bend and twist. Pivot at your feet or move your feet when needing to change directions.  Assume that you cannot safely pick up a paperclip without proper posture. Document Released: 10/29/2005 Document Revised: 02/23/2013 Document Reviewed: 02/10/2009 [facility] Patient Information 2014 [employer], [employer].

## 2014-01-03 NOTE — ED Notes (Signed)
Patient transported to X-ray 

## 2014-01-03 NOTE — ED Provider Notes (Signed)
Medical screening examination/treatment/procedure(s) were performed by non-physician practitioner and as supervising physician I was immediately available for consultation/collaboration.  EKG Interpretation   None       Devoria AlbeIva Donavyn Fecher, MD, Armando GangFACEP   Ward GivensIva L Tiffney Haughton, MD 01/03/14 2002

## 2014-01-03 NOTE — ED Provider Notes (Signed)
CSN: [id number]     Arrival date & time 01/03/14  1721 History  This chart was scribed for non-physician practitioner Marlon Peliffany Ngan Qualls working with Ward GivensIva L Knapp, MD by Carl Bestelina Holson, ED Scribe. This patient was seen in room TR09C/TR09C and the patient's care was started at 6:10 PM.     Chief Complaint  Patient presents with  . Leg Pain  . Back Pain     The history is provided by the patient. No language interpreter was used.   HPI Comments: Jill Hobbs is a 28 y.o. female who presents to the Emergency Department complaining of constant, throbbing, right leg and intermittent, sharp lower back pain that started 8 days ago while the patient was sitting at her desk at work. 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 The patient states that her symptoms started with throbbing leg pain located in her right thigh.  The patient states that she has experienced numbness in her legs bilaterally that has inhibited her from walking but it has not happened in the past few days and she is not currently experiencing those symptoms.  She denies loss of bladder or urine function, dysuria, vomiting, diarrhea, and rash as associated symptoms.  The patient states that she has rotated Tylenol and Ibuprofen with no relief to her symptoms.  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  She denies any recent traumas that would cause her symptoms.    Past Medical History  Diagnosis Date  . Migraine   . Vitamin D deficiency   . 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 LFT elevation     unclear etiology; negative work up in 08/2011.   Marland Kitchen. Positive ANA (antinuclear antibody)     resolved when she was evaluated by Rheumatology   Past Surgical History  Procedure Laterality Date  . Wisdom tooth extraction     Family History  Problem Relation Age of Onset  . Thalassemia Mother   . Thalassemia Brother   . Cancer Maternal Grandmother     cervical  cancer   History  Substance Use Topics  . Smoking status: Never Smoker   . Smokeless tobacco: Never Used  . Alcohol Use: No   OB History   Grav Para Term Preterm Abortions TAB SAB Ect Mult Living                 Review of Systems  Gastrointestinal: Negative for vomiting and diarrhea.  Genitourinary: Negative for dysuria.  Musculoskeletal: Positive for arthralgias (right leg) and back pain (lower).  Skin: Negative for rash.  Neurological: Positive for numbness (bilateral legs).  All other systems reviewed and are negative.      Allergies  Percocet  Home Medications   Current Outpatient Rx  Name  Route  Sig  Dispense  Refill  . acetaminophen (TYLENOL) 500 MG tablet   Oral   Take 1,000 mg by mouth daily as needed for mild pain.         Marland Kitchen. aspirin-acetaminophen-caffeine (EXCEDRIN MIGRAINE) 250-250-65 MG per tablet   Oral   Take 1 tablet by mouth every 6 (six) hours as needed for headache.          . ibuprofen (ADVIL,MOTRIN) 200 MG tablet   Oral   Take 400 mg by mouth daily as needed for mild pain.          . cyclobenzaprine (FLEXERIL) 10 MG tablet  Oral   Take 1 tablet (10 mg total) by mouth 2 (two) times daily as needed for muscle spasms.   20 tablet   0   . predniSONE (DELTASONE) 10 MG tablet   Oral   Take 2 tablets (20 mg total) by mouth daily.   21 tablet   0     Prednisone dose pack directions:   6 tabs on day ...    Triage Vitals: BP 131/76  Pulse 98  Temp(Src) 98.8 F (37.1 C) (Oral)  Resp 20  Ht 5\' 1"  (1.549 m)  Wt 170 lb (77.111 kg)  BMI 32.14 kg/m2  SpO2 99%  LMP 12/13/2013  Physical Exam  Nursing note and vitals reviewed. Constitutional: She is oriented to person, place, and time. She appears well-developed and well-nourished. No distress.  HENT:  Head: Normocephalic and atraumatic.  Eyes: EOM are normal. Pupils are equal, round, and reactive to light.  Neck: Normal range of motion. Neck supple.  Cardiovascular: Normal rate  and regular rhythm.   Pulmonary/Chest: Effort normal.  Abdominal: Soft.  Musculoskeletal: Normal range of motion.       Lumbar back: She exhibits pain.       Back:   Equal strength to bilateral lower extremities. Neurosensory function adequate to both legs. Skin color is normal. Skin is warm and moist. I see no step off deformity, no bony tenderness. Pt is able to ambulate without limp. Pain is relieved when sitting in certain positions. ROM is decreased due to pain. No crepitus, laceration, effusion, swelling.  Pulses are normal   Neurological: She is alert and oriented to person, place, and time.  Skin: Skin is warm and dry.  Psychiatric: She has a normal mood and affect. Her behavior is normal.    ED Course  Procedures (including critical care time)  DIAGNOSTIC STUDIES: Oxygen Saturation is 99% on room air, normal by my interpretation.    COORDINATION OF CARE: 6:12 PM- Discussed obtaining an x-ray of the patient's lower back.  Advised the patient that x-ray is running an hour and a half behind but the patient agreed to wait.  Discussed administering a shot of pain medication in the ED.  The patient agreed to the treatment plan.    Labs Review Labs Reviewed - No data to display Imaging Review Dg Lumbar Spine Complete  01/03/2014   CLINICAL DATA:  Low back pain for 1 week, no known injury  EXAM: LUMBAR SPINE - COMPLETE 4+ VIEW  COMPARISON:  None  FINDINGS: Vertebral body and disc space heights maintained.  No acute fracture or subluxation.  Left spondylolysis L5.  SI joints symmetric.  Osseous mineralization normal.  IUD in pelvis.  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   Electronically Signed   By: Ulyses Southward M.D.   On: 01/03/2014 19:25    EKG Interpretation   None       MDM   Final diagnoses:  Spondylolysis of lumbar region     Pt has stress fracture to spine without any neurological deficits, mainly pain. Will treat with antiinflammatories and  muscle relaxer. Long discussion with patient about prognosis and treatment plan .She needs to f/u with ortho so it can be monitored, do not feel she would benefit from a neurosurgeon at this time.  Rx: flex and prednisone dose pack.  28 y.o.Jill Hobbs's evaluation in the Emergency Department is complete. It has been determined that no acute conditions requiring further emergency intervention are present at this time. The patient/guardian  have been advised of the diagnosis and plan. We have discussed signs and symptoms that warrant return to the ED, such as changes or worsening in symptoms.  Vital signs are stable at discharge. Filed Vitals:   01/03/14 1729  BP: 131/76  Pulse: 98  Temp: 98.8 F (37.1 C)  Resp: 20    Patient/guardian has voiced understanding and agreed to follow-up with the PCP or specialist.  I personally performed the services described in this documentation, which was scribed in my presence. The recorded information has been reviewed and is accurate.   Dorthula Matas, PA-C 01/03/14 1947  Dorthula Matas, PA-C 01/03/14 1948

## 2014-05-25 ENCOUNTER — Ambulatory Visit (INDEPENDENT_AMBULATORY_CARE_PROVIDER_SITE_OTHER): Payer: Medicaid Other | Admitting: Nurse Practitioner

## 2014-05-25 ENCOUNTER — Encounter: Payer: Self-pay | Admitting: Nurse Practitioner

## 2014-05-25 VITALS — BP 100/75 | HR 84 | Ht 61.0 in | Wt 183.0 lb

## 2014-05-25 DIAGNOSIS — G43009 Migraine without aura, not intractable, without status migrainosus: Secondary | ICD-10-CM | POA: Insufficient documentation

## 2014-05-25 MED ORDER — TOPIRAMATE ER 50 MG PO CAP24: 50.0000 mg | ORAL_CAPSULE | Freq: Every day | ORAL | Status: DC

## 2014-05-25 MED ORDER — RIZATRIPTAN BENZOATE 10 MG PO TABS: 10.0000 mg | ORAL_TABLET | ORAL | Status: DC | PRN

## 2014-05-25 NOTE — Patient Instructions (Signed)

## 2014-05-25 NOTE — Progress Notes (Signed)
Diagnosis: Migraine without Aura  History: Jill Hobbs 28 y.o. [id number] [facility]. She has had migraines since she was about 28 years old. Her mother has them as well. She has noticed in the last 3-4 years they have worsened and now has about 15 headache days per month. She takes OTC meds only for headache. She has had situational depression in past but denies any now. She does have some anxiety. 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 She denies any insomnia.  Location:Left temple and occipital  Number of Headache days/month:15 out of 30 Severe: 4 Moderate: 10 Mild:2  Current Outpatient Prescriptions on File Prior to Visit  Medication Sig Dispense Refill  . acetaminophen (TYLENOL) 500 MG tablet Take 1,000 mg by mouth daily as needed for mild pain.      Marland Kitchen. aspirin-acetaminophen-caffeine (EXCEDRIN MIGRAINE) 250-250-65 MG per tablet Take 1 tablet by mouth every 6 (six) hours as needed for headache.       . ibuprofen (ADVIL,MOTRIN) 200 MG tablet Take 400 mg by mouth daily as needed for mild pain.        No current facility-administered medications on file prior to visit.    Acute/ prevention: OTC medications  Past Medical History  Diagnosis Date  . Migraine   . Vitamin D deficiency   . 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 LFT elevation     unclear etiology; negative work up in 08/2011.   Marland Kitchen. Positive ANA (antinuclear antibody)     resolved when she was evaluated by Rheumatology   Past Surgical History  Procedure Laterality Date  . Wisdom tooth extraction     Family History  Problem Relation Age of Onset  . Thalassemia Mother   . Thalassemia Brother   . Cancer Maternal Grandmother     cervical cancer   Social History:  reports that she has never smoked. She has never used smokeless tobacco. She reports that she does not drink alcohol or use illicit drugs. Allergies:   Allergies  Allergen Reactions  . Percocet [Oxycodone-Acetaminophen] Rash    Triggers: stress, smells, heat  Birth control: Mirenia IUD  ROS: Positive for migraines, situational depression, anxiety, anemia, vitamin D defiency. Negative for HTN, cardiac issues, blood clotting disorder, allergy, asthma  Exam: well developed, well nourished AA Female . Obese  General: NAD HEENT:negative Cardiac:RRR Lungs:Clear Neuro:Negative Skin:Warm and dry  Impression:migraine - common  Plan: Discussed the pathophysiology of migraine and medication management. We discussed reassurance MRI as she has never had one. She will start Trokendi 25 mg for one week then up to 50 mg daily. She will be given Maxalt for migraine. She will join gym and avoid outdoors till weather changes.    Time Spent: 45 minutes

## 2014-06-15 ENCOUNTER — Ambulatory Visit (HOSPITAL_COMMUNITY)
Admission: RE | Admit: 2014-06-15 | Discharge: 2014-06-15 | Disposition: A | Discharge status: Home / Self Care | Payer: Medicaid Other | Source: Ambulatory Visit | Attending: Nurse Practitioner | Admitting: Nurse Practitioner

## 2014-06-15 DIAGNOSIS — G43009 Migraine without aura, not intractable, without status migrainosus: Secondary | ICD-10-CM

## 2014-06-15 MED ORDER — GADOBENATE DIMEGLUMINE 529 MG/ML IV SOLN
20.0000 mL | Freq: Once | INTRAVENOUS | Status: AC | PRN
  Administered 2014-06-15: 17 mL via INTRAVENOUS

## 2014-06-16 ENCOUNTER — Telehealth: Payer: Self-pay | Admitting: *Deleted

## 2014-06-16 NOTE — Telephone Encounter (Signed)
Message copied by Barbara CowerNOGUES, Peony Barner L on Wed Jun 16, 2014  4:03 PM ------      Message from: BAREFOOT, [place]LIN[place] M      Created: Tue Jun 15, 2014  1:41 PM       Hi Inetta Fermoina, can you call [name] and let her know her MRI is negative. thank you, Bonita QuinLinda ------

## 2014-06-16 NOTE — Telephone Encounter (Signed)
Advised patient that her MRI results are normal.

## 2014-06-22 ENCOUNTER — Ambulatory Visit (INDEPENDENT_AMBULATORY_CARE_PROVIDER_SITE_OTHER): Payer: Medicaid Other | Admitting: Nurse Practitioner

## 2014-06-22 ENCOUNTER — Encounter: Payer: Self-pay | Admitting: Nurse Practitioner

## 2014-06-22 VITALS — BP 107/66 | HR 82 | Ht 62.0 in | Wt 179.0 lb

## 2014-06-22 DIAGNOSIS — G43009 Migraine without aura, not intractable, without status migrainosus: Secondary | ICD-10-CM

## 2014-06-22 MED ORDER — IBUPROFEN 800 MG PO TABS: 800.0000 mg | ORAL_TABLET | Freq: Three times a day (TID) | ORAL | Status: DC | PRN

## 2014-06-22 MED ORDER — ONDANSETRON HCL 4 MG PO TABS: 4.0000 mg | ORAL_TABLET | Freq: Four times a day (QID) | ORAL | Status: DC

## 2014-06-22 MED ORDER — TOPIRAMATE ER 100 MG PO CAP24: 100.0000 mg | ORAL_CAPSULE | Freq: Every day | ORAL | Status: DC

## 2014-06-22 NOTE — Patient Instructions (Signed)

## 2014-06-22 NOTE — Progress Notes (Signed)
History:  Jill Hobbs is a 28 y.o. G2P2002 who presents [facility] today for follow up with migraines. She was last seen July 14 and is doing much better. She is having about half the number of migraines she was having. She is mostly able to control migraines with maxalt. Twice she was not and had to repeat the dose. She is also experiencing nausea with migraines. She had her MRI done and it was negative. She is having some tingling with Trokendi and is willing to move the dose up. She has lost 4 lbs.  The following portions of the patient's history were reviewed and updated as appropriate: allergies, current medications, past family history, past medical history, past social history, past surgical history and problem list.  Review of Systems:    Objective:  Physical Exam BP 107/66  Pulse 82  Ht 5\' 2"  (1.575 m)  Wt 179 lb (81.194 kg)  BMI 32.73 kg/m2  LMP 06/14/2014 GENERAL: Well-developed, well-nourished female in no acute distress.  HEENT: Normocephalic, atraumatic.  NECK: Supple. Normal thyroid.  LUNGS: Normal rate. Clear to auscultation bilaterally.  HEART: Regular rate and rhythm with no adventitious sounds.   EXTREMITIES: No cyanosis, clubbing, or edema, 2+ distal pulses.   Labs and Imaging Mr Lodema PilotBrain W Wo Contrast  06/15/2014   CLINICAL DATA:  28 year old female with chronic headaches. Initial encounter. Left temporal and occipital in location, about 15 headaches per month. History of migraines since 10 years .  EXAM: MRI HEAD WITHOUT AND WITH CONTRAST  TECHNIQUE: Multiplanar, multiecho pulse sequences of the brain and surrounding structures were obtained without and with intravenous contrast.  CONTRAST:  17mL MULTIHANCE GADOBENATE DIMEGLUMINE 529 MG/ML IV SOLN  COMPARISON:  None.  FINDINGS: Cerebral volume is normal. No restricted diffusion to suggest acute infarction. No midline shift, mass effect, evidence of mass lesion, ventriculomegaly, extra-axial collection  or acute intracranial hemorrhage. Cervicomedullary junction and pituitary are within normal limits. Negative visualized cervical spine. Major intracranial vascular flow voids are preserved. Wallace CullensGray and white matter signal is within normal limits throughout the brain. No abnormal enhancement identified.  Visualized scalp soft tissues are within normal limits. Normal bone marrow signal. Grossly normal visualized internal auditory structures. Visualized paranasal sinuses and mastoids are clear.  IMPRESSION: Normal MRI appearance of the brain.   Electronically Signed   By: Augusto GambleLee  Hall M.D.   On: 06/15/2014 13:19    Assessment & Plan:  Assessment:  Migraine without aura  Plans:  Will increase Trokendi to 100 mg She can increase fluids and add Vitamin C as needed Add Motrin 800 mg prn Add Zofran 4 mg po prn  Follow up 3 months  Delbert PhenixLinda M Gabreal Worton, NP 06/22/2014 3:13 PM

## 2014-07-06 ENCOUNTER — Telehealth: Payer: Self-pay | Admitting: Nurse Practitioner

## 2014-07-06 NOTE — Telephone Encounter (Signed)
Medicaid will not approve Trokendi but will approve Topamax. Will switch to Topamax 100 mg po qhs # 30/ 6 refills. Called to [facility] today Delbert Phenix, NP

## 2014-07-09 ENCOUNTER — Telehealth: Payer: Self-pay | Admitting: Nurse Practitioner

## 2014-07-09 NOTE — Telephone Encounter (Signed)
Called office to let us know she had an allergic reaction to the Topamax.  She had a rash and diarrhea.  She does wish to be prescribed an alternative medication for her headaches.

## 2014-07-21 ENCOUNTER — Telehealth: Payer: Self-pay | Admitting: *Deleted

## 2014-07-21 DIAGNOSIS — G43911 Migraine, unspecified, intractable, with status migrainosus: Secondary | ICD-10-CM

## 2014-07-21 MED ORDER — VENLAFAXINE HCL ER 75 MG PO CP24: 75.0000 mg | ORAL_CAPSULE | Freq: Every day | ORAL | Status: DC

## 2014-07-21 NOTE — Telephone Encounter (Signed)
Per Jill Hobbs called in Effexor XR  daily.  Patient was instructed to take 1/2 a capsule daily for the the first week, then increase to one capsule daily.  She will call to let us know how she is doing.

## 2014-09-06 ENCOUNTER — Encounter (HOSPITAL_COMMUNITY): Payer: Self-pay | Admitting: Emergency Medicine

## 2014-09-06 ENCOUNTER — Emergency Department (HOSPITAL_COMMUNITY): Payer: Medicaid Other

## 2014-09-06 DIAGNOSIS — R1110 Vomiting, unspecified: Secondary | ICD-10-CM | POA: Diagnosis not present

## 2014-09-06 DIAGNOSIS — Z79899 Other long term (current) drug therapy: Secondary | ICD-10-CM | POA: Diagnosis not present

## 2014-09-06 DIAGNOSIS — Z862 Personal history of diseases of the blood and blood-forming organs and certain disorders involving the immune mechanism: Secondary | ICD-10-CM | POA: Insufficient documentation

## 2014-09-06 DIAGNOSIS — G43909 Migraine, unspecified, not intractable, without status migrainosus: Secondary | ICD-10-CM | POA: Diagnosis present

## 2014-09-06 DIAGNOSIS — G4489 Other headache syndrome: Secondary | ICD-10-CM | POA: Insufficient documentation

## 2014-09-06 DIAGNOSIS — R0602 Shortness of breath: Secondary | ICD-10-CM | POA: Diagnosis not present

## 2014-09-06 DIAGNOSIS — R079 Chest pain, unspecified: Secondary | ICD-10-CM | POA: Insufficient documentation

## 2014-09-06 DIAGNOSIS — R109 Unspecified abdominal pain: Secondary | ICD-10-CM | POA: Insufficient documentation

## 2014-09-06 DIAGNOSIS — Z8639 Personal history of other endocrine, nutritional and metabolic disease: Secondary | ICD-10-CM | POA: Insufficient documentation

## 2014-09-06 DIAGNOSIS — Z3202 Encounter for pregnancy test, result negative: Secondary | ICD-10-CM | POA: Diagnosis not present

## 2014-09-06 LAB — BASIC METABOLIC PANEL
Anion gap: 12 (ref 5–15)
BUN: 13 mg/dL (ref 6–23)
CO2: 24 mEq/L (ref 19–32)
CREATININE: 0.68 mg/dL (ref 0.50–1.10)
Calcium: 9.4 mg/dL (ref 8.4–10.5)
Chloride: 103 mEq/L (ref 96–112)
GFR calc non Af Amer: >90 mL/min (ref >90)
GLUCOSE: 83 mg/dL (ref 70–99)
POTASSIUM: 3.9 meq/L (ref 3.7–5.3)
Sodium: 139 mEq/L (ref 137–147)

## 2014-09-06 LAB — CBC
HEMATOCRIT: 36.2 % (ref 36.0–46.0)
HEMOGLOBIN: 11.9 g/dL — AB (ref 12.0–15.0)
MCH: 21.8 pg — ABNORMAL LOW (ref 26.0–34.0)
MCHC: 32.9 g/dL (ref 30.0–36.0)
MCV: 66.3 fL — AB (ref 78.0–100.0)
Platelets: 332 10*3/uL (ref 150–400)
RBC: 5.46 MIL/uL — ABNORMAL HIGH (ref 3.87–5.11)
RDW: 14.6 % (ref 11.5–15.5)
WBC: 5.4 10*3/uL (ref 4.0–10.5)

## 2014-09-06 LAB — I-STAT TROPONIN, ED: Troponin i, poc: 0 ng/mL (ref 0.00–0.08)

## 2014-09-06 NOTE — ED Notes (Signed)
Pt with hx of migraines to ED complaining since Fri of  migraine, dizziness and nausea that are not helped by her prn migraine meds.  States yesterday began experiencing central chest pressure that she describes as pressure.

## 2014-09-07 ENCOUNTER — Emergency Department (HOSPITAL_COMMUNITY)
Admission: EM | Admit: 2014-09-07 | Discharge: 2014-09-07 | Disposition: A | Discharge status: Home / Self Care | Payer: Medicaid Other | Attending: Emergency Medicine | Admitting: Emergency Medicine

## 2014-09-07 DIAGNOSIS — R079 Chest pain, unspecified: Secondary | ICD-10-CM

## 2014-09-07 DIAGNOSIS — R109 Unspecified abdominal pain: Secondary | ICD-10-CM

## 2014-09-07 DIAGNOSIS — G4489 Other headache syndrome: Secondary | ICD-10-CM

## 2014-09-07 LAB — URINALYSIS, ROUTINE W REFLEX MICROSCOPIC
Bilirubin Urine: NEGATIVE
GLUCOSE, UA: NEGATIVE mg/dL
HGB URINE DIPSTICK: NEGATIVE
Ketones, ur: NEGATIVE mg/dL
Nitrite: NEGATIVE
PH: 6 (ref 5.0–8.0)
Protein, ur: NEGATIVE mg/dL
Specific Gravity, Urine: 1.012 (ref 1.005–1.030)
Urobilinogen, UA: 0.2 mg/dL (ref 0.0–1.0)

## 2014-09-07 LAB — URINE MICROSCOPIC-ADD ON

## 2014-09-07 LAB — D-DIMER, QUANTITATIVE (NOT AT ARMC): D DIMER QUANT: 0.35 ug{FEU}/mL (ref 0.00–0.48)

## 2014-09-07 LAB — POC URINE PREG, ED: Preg Test, Ur: NEGATIVE

## 2014-09-07 MED ORDER — METOCLOPRAMIDE HCL 5 MG/ML IJ SOLN
10.0000 mg | Freq: Once | INTRAMUSCULAR | Status: AC
Start: 2014-09-07 — End: 2014-09-07
  Administered 2014-09-07: 10 mg via INTRAVENOUS
  Filled 2014-09-07: qty 2

## 2014-09-07 MED ORDER — DIPHENHYDRAMINE HCL 50 MG/ML IJ SOLN
25.0000 mg | Freq: Once | INTRAMUSCULAR | Status: AC
  Administered 2014-09-07: 25 mg via INTRAVENOUS
  Filled 2014-09-07: qty 1

## 2014-09-07 NOTE — ED Provider Notes (Signed)
CSN: [id number]     Arrival date & time 09/06/14  1837 History   First MD Initiated Contact with Patient 09/07/14 0040     Chief Complaint  Patient presents with  . Chest Pain  . Migraine      Patient is a 28 y.o. female presenting with chest pain. The history is provided by the patient.  Chest Pain Pain location:  L chest Pain quality: sharp and tightness   Pain radiates to:  Does not radiate Pain severity:  Moderate Onset quality:  Sudden Duration:  1 day Timing:  Constant Progression:  Unchanged Chronicity:  New Relieved by:  Nothing Worsened by:  Nothing tried Associated symptoms: abdominal pain, headache, shortness of breath and vomiting   Associated symptoms: no fever, no lower extremity edema, no syncope and no weakness    Patient presents for multiple complaints: She reports left sided CP that has been constant for a day.  She also reports mild SOB Pain is not worsened with deep breathing She has never had this pain before No h/o CAD/PE/DVT   She also reports mild abdominal pain for past day - denies dysuria and denies vag bleeding/discharge  She also reports HA similar to prior migraines, no focal weakness reported. Past Medical History  Diagnosis Date  . Migraine   . Vitamin D deficiency   . 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 LFT elevation     unclear etiology; negative work up in 08/2011.   Marland Kitchen. Positive ANA (antinuclear antibody)     resolved when she was evaluated by Rheumatology   Past Surgical History  Procedure Laterality Date  . Wisdom tooth extraction     Family History  Problem Relation Age of Onset  . Thalassemia Mother   . Thalassemia Brother   . Cancer Maternal Grandmother     cervical cancer   History  Substance Use Topics  . Smoking status: Never Smoker   . Smokeless tobacco: Never Used  . Alcohol Use: No   OB History   Grav Para Term Preterm Abortions TAB SAB Ect Mult Living   2 2 2        2      Review of Systems  Constitutional: Negative for fever.  Respiratory: Positive for shortness of breath.   Cardiovascular: Positive for chest pain. Negative for syncope.  Gastrointestinal: Positive for vomiting and abdominal pain.  Genitourinary: Negative for vaginal bleeding and vaginal discharge.  Neurological: Positive for headaches. Negative for syncope and weakness.  All other systems reviewed and are negative.     Allergies  Topamax and Percocet  Home Medications   Prior to Admission medications   Medication Sig Start Date End Date Taking? Authorizing Provider  acetaminophen (TYLENOL) 500 MG tablet Take 1,000 mg by mouth daily as needed for mild pain.   Yes Historical Provider, MD  aspirin-acetaminophen-caffeine (EXCEDRIN MIGRAINE) 850-368-5271250-250-65 MG per tablet Take 1 tablet by mouth every 6 (six) hours as needed for headache.    Yes Historical Provider, MD  ibuprofen (ADVIL,MOTRIN) 800 MG tablet Take 800 mg by mouth every 8 (eight) hours as needed for headache.   Yes Historical Provider, MD  ondansetron (ZOFRAN) 4 MG tablet Take 4 mg by mouth every 8 (eight) hours as needed for nausea or vomiting.   Yes Historical Provider, MD  rizatriptan (MAXALT) 10 MG tablet Take 10 mg by mouth as needed for migraine. May repeat in 2 hours if needed  Yes Historical Provider, MD  Topiramate ER (TROKENDI XR) 100 MG CP24 Take 100 mg by mouth at bedtime.   Yes Historical Provider, MD  venlafaxine XR (EFFEXOR-XR) 75 MG 24 hr capsule Take 75 mg by mouth daily with breakfast.   Yes Historical Provider, MD   BP 114/71  Pulse 80  Temp(Src) 97.8 F (36.6 C) (Oral)  Resp 15  SpO2 91%  LMP 08/12/2014 Physical Exam CONSTITUTIONAL: Well developed/well nourished HEAD: Normocephalic/atraumatic EYES: EOMI/PERRL, no nystagmus, no ptosis ENMT: Mucous membranes moist NECK: supple no meningeal signs, no bruits SPINE:entire spine nontender CV: S1/S2 noted, no murmurs/rubs/gallops noted LUNGS:  Lungs are clear to auscultation bilaterally, no apparent distress ABDOMEN: soft, nontender, no rebound or guarding GU:no cva tenderness NEURO:Awake/alert, facies symmetric, no arm or leg drift is noted Equal 5/5 strength with shoulder abduction, elbow flex/extension, wrist flex/extension in upper extremities and equal hand grips bilaterally Equal 5/5 strength with hip flexion,knee flex/extension, foot dorsi/plantar flexion Cranial nerves 3/4/5/6/05/20/09/11/12 tested and intact Gait normal without ataxia No past pointing Sensation to light touch intact in all extremities EXTREMITIES: pulses normal, full ROM SKIN: warm, color normal PSYCH: no abnormalities of mood noted   ED Course  Procedures   1:27 AM For HA - similar to prior migraines, well appearing, no neuro deficits, defer further workup For CP - will add on D-dimer as had tachycardia in room and brief episode of hypoxia 2:08 AM D-dimer negative Hypoxia resolved Will d/c home  Labs Review Labs Reviewed  CBC - Abnormal; Notable for the following:    RBC 5.46 (*)    Hemoglobin 11.9 (*)    MCV 66.3 (*)    MCH 21.8 (*)    All other components within normal limits  URINALYSIS, ROUTINE W REFLEX MICROSCOPIC - Abnormal; Notable for the following:    Leukocytes, UA SMALL (*)    All other components within normal limits  BASIC METABOLIC PANEL  D-DIMER, QUANTITATIVE  URINE MICROSCOPIC-ADD ON  Rosezena SensorI-STAT TROPOININ, ED  POC URINE PREG, ED    Imaging Review Dg Chest 2 View  09/06/2014   CLINICAL DATA:  Patient with migraine headache, for 3 days. Dizziness and nausea. Central chest pressure.  EXAM: CHEST  2 VIEW  COMPARISON:  08/28/2008.  FINDINGS: Normal heart, mediastinum and hila. Clear lungs. No pleural effusion or pneumothorax. Bony thorax is unremarkable.  IMPRESSION: Normal chest radiographs.   Electronically Signed   By: Amie Portlandavid  Ormond M.D.   On: 09/06/2014 20:17     Date: 09/07/2014 0059  Rate: 76  Rhythm: normal sinus  rhythm  QRS Axis: normal  Intervals: normal  ST/T Wave abnormalities: nonspecific T wave changes  Conduction Disutrbances:none  Narrative Interpretation:   Old EKG Reviewed: unchanged    MDM   Final diagnoses:  Other headache syndrome  Chest pain, unspecified chest pain type  Abdominal pain, unspecified abdominal location    Nursing notes including past medical history and social history reviewed and considered in documentation Labs/vital reviewed and considered     Joya Gaskinsonald W Sharmel Ballantine, MD 09/07/14 [phone]

## 2014-09-07 NOTE — Discharge Instructions (Signed)
You are having a headache. No specific cause was found today for your headache. It may have been a migraine or other cause of headache. Stress, anxiety, fatigue, and depression are common triggers for headaches. Your headache today does not appear to be life-threatening or require hospitalization, but often the exact cause of headaches is not determined in the emergency department. Therefore, follow-up with your doctor is very important to find out what may have caused your headache, and whether or not you need any further diagnostic testing or treatment. Sometimes headaches can appear benign (not harmful), but then more serious symptoms can develop which should prompt an immediate re-evaluation by your doctor or the emergency department.  SEEK MEDICAL ATTENTION IF:  You develop possible problems with medications prescribed.  The medications don't resolve your headache, if it recurs , or if you have multiple episodes of vomiting or can't take fluids. You have a change from the usual headache.  RETURN IMMEDIATELY IF you develop a sudden, severe headache or confusion, become poorly responsive or faint, develop a fever above 100.29F or problem breathing, have a change in speech, vision, swallowing, or understanding, or develop new weakness, numbness, tingling, incoordination, or have a seizure.  Abdominal (belly) pain can be caused by many things. any cases can be observed and treated at home after initial evaluation in the emergency department. Even though you are being discharged home, abdominal pain can be unpredictable. Therefore, you need a repeated exam if your pain does not resolve, returns, or worsens. Most patients with abdominal pain don't have to be admitted to the hospital or have surgery, but serious problems like appendicitis and gallbladder attacks can start out as nonspecific pain. Many abdominal conditions cannot be diagnosed in one visit, so follow-up evaluations are very important. SEEK  IMMEDIATE MEDICAL ATTENTION IF: The pain does not go away or becomes severe, particularly over the next 8-12 hours.  A temperature above 100.29F develops.  Repeated vomiting occurs (multiple episodes).  The pain becomes localized to portions of the abdomen. The right side could possibly be appendicitis. In an adult, the left lower portion of the abdomen could be colitis or diverticulitis.  Blood is being passed in stools or vomit (bright red or black tarry stools).  Return also if you develop chest pain, difficulty breathing, dizziness or fainting, or become confused, poorly responsive, or inconsolable.  Your caregiver has diagnosed you as having chest pain that is not specific for one problem, but does not require admission.  Chest pain comes from many different causes.  SEEK IMMEDIATE MEDICAL ATTENTION IF: You have severe chest pain, especially if the pain is crushing or pressure-like and spreads to the arms, back, neck, or jaw, or if you have sweating, nausea (feeling sick to your stomach), or shortness of breath. THIS IS AN EMERGENCY. Don't wait to see if the pain will go away. Get medical help at once. Call 911 or 0 (operator). DO NOT drive yourself to the hospital.  Your chest pain gets worse and does not go away with rest.  You have an attack of chest pain lasting longer than usual, despite rest and treatment with the medications your caregiver has prescribed.  You wake from sleep with chest pain or shortness of breath.  You feel dizzy or faint.  You have chest pain not typical of your usual pain for which you originally saw your caregiver.

## 2014-09-07 NOTE — ED Notes (Signed)
Dr. Bebe ShaggyWickline at the bedside to see the patient.

## 2014-09-13 ENCOUNTER — Encounter (HOSPITAL_COMMUNITY): Payer: Self-pay | Admitting: Emergency Medicine

## 2014-09-21 ENCOUNTER — Encounter: Payer: Self-pay | Admitting: Nurse Practitioner

## 2014-09-21 ENCOUNTER — Ambulatory Visit (INDEPENDENT_AMBULATORY_CARE_PROVIDER_SITE_OTHER): Payer: Medicaid Other | Admitting: Nurse Practitioner

## 2014-09-21 VITALS — BP 100/68 | HR 78 | Wt 183.0 lb

## 2014-09-21 DIAGNOSIS — E669 Obesity, unspecified: Secondary | ICD-10-CM

## 2014-09-21 DIAGNOSIS — G43001 Migraine without aura, not intractable, with status migrainosus: Secondary | ICD-10-CM | POA: Diagnosis not present

## 2014-09-21 MED ORDER — KETOROLAC TROMETHAMINE 30 MG/ML IJ SOLN: 30.0000 mg | Freq: Once | INTRAMUSCULAR | Status: DC

## 2014-09-21 MED ORDER — TOPIRAMATE ER 50 MG PO CAP24: 50.0000 mg | ORAL_CAPSULE | Freq: Every day | ORAL | Status: DC

## 2014-09-21 MED ORDER — PROMETHAZINE HCL 25 MG PO TABS: 25.0000 mg | ORAL_TABLET | Freq: Four times a day (QID) | ORAL | Status: DC | PRN

## 2014-09-21 MED ORDER — KETOROLAC TROMETHAMINE 30 MG/ML IJ SOLN
30.0000 mg | Freq: Once | INTRAMUSCULAR | Status: AC
  Administered 2014-09-21: 30 mg via INTRAMUSCULAR

## 2014-09-21 NOTE — Progress Notes (Signed)
History:  Jill Hobbs is a 28 y.o. G2P2002 who [facility] for follow up with migraines. She is not doing well with Effexor as it is making her sleepy. She did switch to taking it at night and it is still making her sleepy. She admits to having a lot of stress related to buying a new house. She has had a migraine off and on for the last 3 weeks. She actually ended up in ED 2 weeks ago and got a migraine cocktail which lasted about 1 day. She did very well on Trokendi but Medicaid would not pay for higher doses.   The following portions of the patient's history were reviewed and updated as appropriate: allergies, current medications, past family history, past medical history, past social history, past surgical history and problem list.  Review of Systems:    Objective:  Physical Exam BP 100/68 mmHg  Pulse 78  Wt 183 lb (83.008 kg)  LMP 09/14/2014 GENERAL: Well-developed, well-nourished female in no acute distress.  HEENT: Normocephalic, atraumatic.  NECK: Supple. Normal thyroid.  LUNGS: Normal rate. Clear to auscultation bilaterally.  HEART: Regular rate and rhythm with no adventitious sounds.  EXTREMITIES: No cyanosis, clubbing, or edema, 2+ distal pulses.  Trigger Point Injections  Procedure: 2cc lidocaine, 2 cc marcaine, 1 cc dexamethazone. Injected with 2cc each site in left Occipital, and 3cc in  left [place]. Pt tolerated procedure well. Advised to ice tonight  Labs and Imaging Dg Chest 2 View  09/06/2014   CLINICAL DATA:  Patient with migraine headache, for 3 days. Dizziness and nausea. Central chest pressure.  EXAM: CHEST  2 VIEW  COMPARISON:  08/28/2008.  FINDINGS: Normal heart, mediastinum and hila. Clear lungs. No pleural effusion or pneumothorax. Bony thorax is unremarkable.  IMPRESSION: Normal chest radiographs.   Electronically Signed   By: Amie Portlandavid  Ormond M.D.   On: 09/06/2014 20:17    Assessment & Plan:  Assessment:  Migraine    Plans: Trigger point injections today Stay on Effexor / may drop dose to 1/2 tab Retrial Trokendi 50 mg po qday Add phenergan prn Follow up 1 month  Jill PhenixLinda M Josejulian Tarango, NP 09/21/2014 1:10 PM

## 2014-09-21 NOTE — Patient Instructions (Signed)

## 2014-09-28 ENCOUNTER — Encounter: Payer: Medicaid Other | Admitting: Nurse Practitioner

## 2014-10-19 ENCOUNTER — Ambulatory Visit (INDEPENDENT_AMBULATORY_CARE_PROVIDER_SITE_OTHER): Payer: Medicaid Other | Admitting: Nurse Practitioner

## 2014-10-19 ENCOUNTER — Encounter: Payer: Self-pay | Admitting: Nurse Practitioner

## 2014-10-19 VITALS — BP 113/85 | HR 86 | Wt 179.0 lb

## 2014-10-19 DIAGNOSIS — D649 Anemia, unspecified: Secondary | ICD-10-CM

## 2014-10-19 DIAGNOSIS — G43009 Migraine without aura, not intractable, without status migrainosus: Secondary | ICD-10-CM

## 2014-10-19 LAB — COMPREHENSIVE METABOLIC PANEL
ALK PHOS: 39 U/L (ref 39–117)
ALT: 12 U/L (ref 0–35)
AST: 13 U/L (ref 0–37)
Albumin: 4.2 g/dL (ref 3.5–5.2)
BUN: 13 mg/dL (ref 6–23)
CO2: 22 mEq/L (ref 19–32)
CREATININE: 0.77 mg/dL (ref 0.50–1.10)
Calcium: 9.3 mg/dL (ref 8.4–10.5)
Chloride: 109 mEq/L (ref 96–112)
Glucose, Bld: 86 mg/dL (ref 70–99)
Potassium: 3.9 mEq/L (ref 3.5–5.3)
Sodium: 137 mEq/L (ref 135–145)
Total Bilirubin: 0.3 mg/dL (ref 0.2–1.2)
Total Protein: 7.2 g/dL (ref 6.0–8.3)

## 2014-10-19 MED ORDER — TRAMADOL HCL 50 MG PO TABS: 50.0000 mg | ORAL_TABLET | Freq: Four times a day (QID) | ORAL | Status: DC | PRN

## 2014-10-19 NOTE — Progress Notes (Signed)
History:  Jill Hobbs is a 28 y.o. G2P2002 who [facility] today for follow up on migraine without aura. She is doing better and is now experiencing only 2 migraines per week. She is taking Trokendi 50 mg and Effexor 1/2 capsule. She is still a little sleepy, but it is tolerable. She feels the Maxalt works about half the time and needs something for rescue. She has anemia and has stopped taking her iron tablets. She also had elevated liver functions in past that were never repeated.   The following portions of the patient's history were reviewed and updated as appropriate: allergies, current medications, past family history, past medical history, past social history, past surgical history and problem list.  Review of Systems:    Objective:  Physical Exam BP 113/85 mmHg  Pulse 86  Wt 179 lb (81.194 kg)  LMP 10/10/2014 GENERAL: Well-developed, well-nourished female in no acute distress.  HEENT: Normocephalic, atraumatic.  NECK: Supple. Normal thyroid.  LUNGS: Normal rate. Clear to auscultation bilaterally.  HEART: Regular rate and rhythm with no adventitious sounds.  EXTREMITIES: No cyanosis, clubbing, or edema, 2+ distal pulses.   Labs and Imaging No results found.  Assessment & Plan:  Assessment:  Migraine without Aura Anemia Elevated Liver Functions  Plans:  Add Ultram 50 mg for rescue Check CMET and ferritin levels today Encouraged to restart Fe tablets Follow up in 2 months  Jill PhenixLinda M Hobbs Chestnut, NP 10/19/2014 9:36 AM

## 2014-10-19 NOTE — Patient Instructions (Signed)

## 2014-10-20 LAB — FERRITIN: Ferritin: 252 ng/mL (ref 10–291)

## 2014-10-21 ENCOUNTER — Other Ambulatory Visit: Payer: Self-pay | Admitting: *Deleted

## 2014-10-21 DIAGNOSIS — G43911 Migraine, unspecified, intractable, with status migrainosus: Secondary | ICD-10-CM

## 2014-10-21 MED ORDER — TOPIRAMATE ER 50 MG PO CAP24: 1.0000 | ORAL_CAPSULE | ORAL | Status: DC

## 2014-10-21 NOTE — Telephone Encounter (Signed)
Patient called and needed a prescription sent in for Trokendi.  She said she has been on this medicine before and didn't know what the problem was because she had no problem getting it back it in the summmer.  I have resent this medication in and told patient to call back if there is still a problem.

## 2014-12-07 ENCOUNTER — Encounter: Payer: Self-pay | Admitting: General Practice

## 2014-12-21 ENCOUNTER — Encounter: Payer: Medicaid Other | Admitting: Nurse Practitioner

## 2015-01-18 ENCOUNTER — Encounter (HOSPITAL_COMMUNITY): Payer: Self-pay | Admitting: Emergency Medicine

## 2015-01-18 ENCOUNTER — Emergency Department (HOSPITAL_COMMUNITY)
Admission: EM | Admit: 2015-01-18 | Discharge: 2015-01-18 | Disposition: A | Discharge status: Home / Self Care | Payer: Medicaid Other | Source: Home / Self Care | Attending: Family Medicine | Admitting: Family Medicine

## 2015-01-18 DIAGNOSIS — M4300 Spondylolysis, site unspecified: Secondary | ICD-10-CM

## 2015-01-18 DIAGNOSIS — M545 Low back pain, unspecified: Secondary | ICD-10-CM

## 2015-01-18 MED ORDER — DICLOFENAC SODIUM 75 MG PO TBEC
75.0000 mg | DELAYED_RELEASE_TABLET | Freq: Two times a day (BID) | ORAL | Status: DC | PRN
Start: 2015-01-18 — End: 2016-03-31

## 2015-01-18 MED ORDER — CYCLOBENZAPRINE HCL 5 MG PO TABS: 5.0000 mg | ORAL_TABLET | Freq: Every evening | ORAL | Status: DC | PRN

## 2015-01-18 NOTE — Discharge Instructions (Signed)
Thank you for coming in today. Take diclofenac twice daily for pain as needed. Use Flexeril as needed. Come back or go to the emergency room if you notice new weakness new numbness problems walking or bowel or bladder problems.   Back Exercises These exercises may help you when beginning to rehabilitate your injury. Your symptoms may resolve with or without further involvement from your physician, physical therapist or athletic trainer. While completing these exercises, remember:   Restoring tissue flexibility helps normal motion to return to the joints. This allows healthier, less painful movement and activity.  An effective stretch should be held for at least 30 seconds.  A stretch should never be painful. You should only feel a gentle lengthening or release in the stretched tissue. STRETCH - Extension, Prone on Elbows   Lie on your stomach on the floor, a bed will be too soft. Place your palms about shoulder width apart and at the height of your head.  Place your elbows under your shoulders. If this is too painful, stack pillows under your chest.  Allow your body to relax so that your hips drop lower and make contact more completely with the floor.  Hold this position for __________ seconds.  Slowly return to lying flat on the floor. Repeat __________ times. Complete this exercise __________ times per day.  RANGE OF MOTION - Extension, Prone Press Ups   Lie on your stomach on the floor, a bed will be too soft. Place your palms about shoulder width apart and at the height of your head.  Keeping your back as relaxed as possible, slowly straighten your elbows while keeping your hips on the floor. You may adjust the placement of your hands to maximize your comfort. As you gain motion, your hands will come more underneath your shoulders.  Hold this position __________ seconds.  Slowly return to lying flat on the floor. Repeat __________ times. Complete this exercise __________ times  per day.  RANGE OF MOTION- Quadruped, Neutral Spine   Assume a hands and knees position on a firm surface. Keep your hands under your shoulders and your knees under your hips. You may place padding under your knees for comfort.  Drop your head and point your tail bone toward the ground below you. This will round out your low back like an angry cat. Hold this position for __________ seconds.  Slowly lift your head and release your tail bone so that your back sags into a large arch, like an old horse.  Hold this position for __________ seconds.  Repeat this until you feel limber in your low back.  Now, find your "sweet spot." This will be the most comfortable position somewhere between the two previous positions. This is your neutral spine. Once you have found this position, tense your stomach muscles to support your low back.  Hold this position for __________ seconds. Repeat __________ times. Complete this exercise __________ times per day.  STRETCH - Flexion, Single Knee to Chest   Lie on a firm bed or floor with both legs extended in front of you.  Keeping one leg in contact with the floor, bring your opposite knee to your chest. Hold your leg in place by either grabbing behind your thigh or at your knee.  Pull until you feel a gentle stretch in your low back. Hold __________ seconds.  Slowly release your grasp and repeat the exercise with the opposite side. Repeat __________ times. Complete this exercise __________ times per day.  STRETCH -  Hamstrings, Standing  Stand or sit and extend your right / left leg, placing your foot on a chair or foot stool  Keeping a slight arch in your low back and your hips straight forward.  Lead with your chest and lean forward at the waist until you feel a gentle stretch in the back of your right / left knee or thigh. (When done correctly, this exercise requires leaning only a small distance.)  Hold this position for __________ seconds. Repeat  __________ times. Complete this stretch __________ times per day. STRENGTHENING - Deep Abdominals, Pelvic Tilt   Lie on a firm bed or floor. Keeping your legs in front of you, bend your knees so they are both pointed toward the ceiling and your feet are flat on the floor.  Tense your lower abdominal muscles to press your low back into the floor. This motion will rotate your pelvis so that your tail bone is scooping upwards rather than pointing at your feet or into the floor.  With a gentle tension and even breathing, hold this position for __________ seconds. Repeat __________ times. Complete this exercise __________ times per day.  STRENGTHENING - Abdominals, Crunches   Lie on a firm bed or floor. Keeping your legs in front of you, bend your knees so they are both pointed toward the ceiling and your feet are flat on the floor. Cross your arms over your chest.  Slightly tip your chin down without bending your neck.  Tense your abdominals and slowly lift your trunk high enough to just clear your shoulder blades. Lifting higher can put excessive stress on the low back and does not further strengthen your abdominal muscles.  Control your return to the starting position. Repeat __________ times. Complete this exercise __________ times per day.  STRENGTHENING - Quadruped, Opposite UE/LE Lift   Assume a hands and knees position on a firm surface. Keep your hands under your shoulders and your knees under your hips. You may place padding under your knees for comfort.  Find your neutral spine and gently tense your abdominal muscles so that you can maintain this position. Your shoulders and hips should form a rectangle that is parallel with the floor and is not twisted.  Keeping your trunk steady, lift your right hand no higher than your shoulder and then your left leg no higher than your hip. Make sure you are not holding your breath. Hold this position __________ seconds.  Continuing to keep your  abdominal muscles tense and your back steady, slowly return to your starting position. Repeat with the opposite arm and leg. Repeat __________ times. Complete this exercise __________ times per day. Document Released: 11/16/2005 Document Revised: 01/21/2012 Document Reviewed: 02/10/2009 [facility] Patient Information 2015 [employer] [place]. This information is not intended to replace advice given to you by your health care provider. Make sure you discuss any questions you have with your health care provider.

## 2015-01-18 NOTE — ED Notes (Signed)
Lower back pain for one week, denies injury recently.  Patient has had a back injury in the past.

## 2015-01-18 NOTE — ED Provider Notes (Signed)
Jill Hobbs is a 29 y.o. female who presents to Urgent Care today for lumbago. Patient has a one-week history of low back pain. The pain is left-sided and nonradiating. No weakness or numbness bowel bladder dysfunction or difficulty walking. The pain is worse while laying. She has tried over-the-counter medications which help. No fevers or chills vomiting or diarrhea. 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   Past Medical History  Diagnosis Date  . Migraine   . Vitamin D deficiency   . 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 LFT elevation     unclear etiology; negative work up in 08/2011.   Marland Kitchen. Positive ANA (antinuclear antibody)     resolved when she was evaluated by Rheumatology   Past Surgical History  Procedure Laterality Date  . Wisdom tooth extraction     History  Substance Use Topics  . Smoking status: Never Smoker   . Smokeless tobacco: Never Used  . Alcohol Use: No   ROS as above Medications: No current facility-administered medications for this encounter.   Current Outpatient Prescriptions  Medication Sig Dispense Refill  . acetaminophen (TYLENOL) 500 MG tablet Take 1,000 mg by mouth daily as needed for mild pain.    . cyclobenzaprine (FLEXERIL) 5 MG tablet Take 1 tablet (5 mg total) by mouth at bedtime as needed for muscle spasms. 30 tablet 1  . diclofenac (VOLTAREN) 75 MG EC tablet Take 1 tablet (75 mg total) by mouth 2 (two) times daily as needed. 30 tablet 1  . ibuprofen (ADVIL,MOTRIN) 800 MG tablet Take 800 mg by mouth every 8 (eight) hours as needed for headache.    . ondansetron (ZOFRAN) 4 MG tablet Take 4 mg by mouth every 8 (eight) hours as needed for nausea or vomiting.    . promethazine (PHENERGAN) 25 MG tablet Take 1 tablet (25 mg total) by mouth every 6 (six) hours as needed for nausea or vomiting. 30 tablet 1  . rizatriptan (MAXALT) 10 MG tablet Take 10 mg by  mouth as needed for migraine. May repeat in 2 hours if needed    . Topiramate ER 50 MG CP24 Take 50 mg by mouth at bedtime. 30 capsule 11  . Topiramate ER 50 MG CP24 Take 1 capsule by mouth as directed. 240 capsule 1  . traMADol (ULTRAM) 50 MG tablet Take 1 tablet (50 mg total) by mouth every 6 (six) hours as needed. 30 tablet 0  . venlafaxine XR (EFFEXOR-XR) 75 MG 24 hr capsule Take 75 mg by mouth daily with breakfast.     Allergies  Allergen Reactions  . Percocet [Oxycodone-Acetaminophen] Rash     Exam:  BP 123/79 mmHg  Pulse 99  Temp(Src) 98.4 F (36.9 C) (Oral)  Resp 16  SpO2 98% Gen: Well NAD HEENT: EOMI,  MMM Lungs: Normal work of breathing. CTABL Heart: RRR no MRG Exts: Brisk capillary refill, warm and well perfused.  Neck: Nontender to midline. Mildly tender palpation left SI joint. Negative straight leg raise test bilaterally. Lumbar range of motion is intact. No significant pain with extension Reflexes are equal and normal bilateral lower extremities. Strength is intact throughout. Normal sensation. Normal gait.  No results found for this or any previous visit (from the past 24 hour(s)). No results found.  Assessment and Plan: 29 y.o. female with lumbago. Likely due to myofascial dysfunction. Doubtful that spondylolysis has anything to do with it. Plan to  treat with NSAIDs and Flexeril. If not better follow up with sports medicine for further evaluation and management. Will avoid x-rays at this time to avoid irradiating the ovaries.  Discussed warning signs or symptoms. Please see discharge instructions. Patient expresses understanding.     Rodolph Bong, MD 01/18/15 [phone]

## 2015-03-25 ENCOUNTER — Encounter (HOSPITAL_COMMUNITY): Payer: Self-pay

## 2015-03-25 ENCOUNTER — Emergency Department (HOSPITAL_COMMUNITY)
Admission: EM | Admit: 2015-03-25 | Discharge: 2015-03-25 | Disposition: A | Discharge status: Home / Self Care | Payer: Medicaid Other | Source: Home / Self Care | Attending: Family Medicine | Admitting: Family Medicine

## 2015-03-25 DIAGNOSIS — R0789 Other chest pain: Secondary | ICD-10-CM | POA: Diagnosis not present

## 2015-03-25 MED ORDER — DICLOFENAC POTASSIUM 50 MG PO TABS: 50.0000 mg | ORAL_TABLET | Freq: Three times a day (TID) | ORAL | Status: DC

## 2015-03-25 NOTE — ED Provider Notes (Signed)
CSN: [id number]     Arrival date & time 03/25/15  1452 History   First MD Initiated Contact with Patient 03/25/15 1509     No chief complaint on file.  (Consider location/radiation/quality/duration/timing/severity/associated sxs/prior Treatment) Patient is a 29 y.o. female presenting with chest pain. The history is provided by the patient.  Chest Pain Pain location:  L lateral chest Pain quality: sharp   Pain radiates to:  Does not radiate Pain radiates to the back: no   Pain severity:  Mild Onset quality:  Gradual Duration:  20 hours Progression:  Unchanged Chronicity:  New Context: movement and raising an arm   Relieved by:  None tried Worsened by:  Nothing tried Ineffective treatments:  None tried Associated symptoms: shortness of breath   Associated symptoms: no fever, no lower extremity edema, no nausea and no palpitations     Past Medical History  Diagnosis Date  . Migraine   . Vitamin D deficiency   . 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 LFT elevation     unclear etiology; negative work up in 08/2011.   Marland Kitchen. Positive ANA (antinuclear antibody)     resolved when she was evaluated by Rheumatology   Past Surgical History  Procedure Laterality Date  . Wisdom tooth extraction     Family History  Problem Relation Age of Onset  . Thalassemia Mother   . Thalassemia Brother   . Cancer Maternal Grandmother     cervical cancer   History  Substance Use Topics  . Smoking status: Never Smoker   . Smokeless tobacco: Never Used  . Alcohol Use: No   OB History    Gravida Para Term Preterm AB TAB SAB Ectopic Multiple Living   2 2 2       2      Review of Systems  Constitutional: Negative.  Negative for fever.  Respiratory: Positive for shortness of breath. Negative for wheezing.   Cardiovascular: Positive for chest pain. Negative for palpitations and leg swelling.  Gastrointestinal: Negative.  Negative for nausea.     Allergies  Percocet  Home Medications   Prior to Admission medications   Medication Sig Start Date End Date Taking? Authorizing Provider  acetaminophen (TYLENOL) 500 MG tablet Take 1,000 mg by mouth daily as needed for mild pain.    Historical Provider, MD  cyclobenzaprine (FLEXERIL) 5 MG tablet Take 1 tablet (5 mg total) by mouth at bedtime as needed for muscle spasms. 01/18/15   Rodolph BongEvan S Corey, MD  diclofenac (CATAFLAM) 50 MG tablet Take 1 tablet (50 mg total) by mouth 3 (three) times daily. Prn chest pain 03/25/15   Linna HoffJames D Sullivan Jacuinde, MD  diclofenac (VOLTAREN) 75 MG EC tablet Take 1 tablet (75 mg total) by mouth 2 (two) times daily as needed. 01/18/15   Rodolph BongEvan S Corey, MD  ibuprofen (ADVIL,MOTRIN) 800 MG tablet Take 800 mg by mouth every 8 (eight) hours as needed for headache.    Historical Provider, MD  ondansetron (ZOFRAN) 4 MG tablet Take 4 mg by mouth every 8 (eight) hours as needed for nausea or vomiting.    Historical Provider, MD  promethazine (PHENERGAN) 25 MG tablet Take 1 tablet (25 mg total) by mouth every 6 (six) hours as needed for nausea or vomiting. 09/21/14   Delbert PhenixLinda M Barefoot, NP  rizatriptan (MAXALT) 10 MG tablet Take 10 mg by mouth as needed for migraine. May repeat in 2 hours if needed  Historical Provider, MD  Topiramate ER 50 MG CP24 Take 50 mg by mouth at bedtime. 09/21/14   Delbert PhenixLinda M Barefoot, NP  Topiramate ER 50 MG CP24 Take 1 capsule by mouth as directed. 10/21/14   Delbert PhenixLinda M Barefoot, NP  traMADol (ULTRAM) 50 MG tablet Take 1 tablet (50 mg total) by mouth every 6 (six) hours as needed. 10/19/14   Delbert PhenixLinda M Barefoot, NP  venlafaxine XR (EFFEXOR-XR) 75 MG 24 hr capsule Take 75 mg by mouth daily with breakfast.    Historical Provider, MD   BP 113/68 mmHg  Pulse 101  Temp(Src) 98.5 F (36.9 C) (Oral)  Resp 16  SpO2 100% Physical Exam  Constitutional: She is oriented to person, place, and time. She appears well-developed and well-nourished. No distress.  HENT:  Head:  Normocephalic.  Right Ear: External ear normal.  Left Ear: External ear normal.  Mouth/Throat: Oropharynx is clear and moist.  Eyes: Pupils are equal, round, and reactive to light.  Neck: Normal range of motion. Neck supple.  Cardiovascular: Regular rhythm and normal heart sounds.   Pulmonary/Chest: Effort normal and breath sounds normal. She exhibits tenderness.  Lymphadenopathy:    She has no cervical adenopathy.  Neurological: She is alert and oriented to person, place, and time.  Skin: Skin is warm and dry.  Nursing note and vitals reviewed.   ED Course  Procedures (including critical care time) Labs Review Labs Reviewed - No data to display  Imaging Review No results found.   MDM   1. Left-sided chest wall pain        Linna HoffJames D Tomeshia Pizzi, MD 03/25/15 1530

## 2015-03-25 NOTE — ED Notes (Signed)
C/o pain in left anterior chest wall since yesterday. Pain worse w deep breath, movement, direct palpation of chest wall at ccm, NAD

## 2015-03-26 ENCOUNTER — Emergency Department (HOSPITAL_COMMUNITY): Payer: Medicaid Other

## 2015-03-26 ENCOUNTER — Encounter (HOSPITAL_COMMUNITY): Payer: Self-pay | Admitting: Family Medicine

## 2015-03-26 ENCOUNTER — Emergency Department (HOSPITAL_COMMUNITY)
Admission: EM | Admit: 2015-03-26 | Discharge: 2015-03-26 | Disposition: A | Discharge status: Home / Self Care | Payer: Medicaid Other | Attending: Emergency Medicine | Admitting: Emergency Medicine

## 2015-03-26 DIAGNOSIS — Z8639 Personal history of other endocrine, nutritional and metabolic disease: Secondary | ICD-10-CM | POA: Diagnosis not present

## 2015-03-26 DIAGNOSIS — Z79899 Other long term (current) drug therapy: Secondary | ICD-10-CM | POA: Insufficient documentation

## 2015-03-26 DIAGNOSIS — Z3202 Encounter for pregnancy test, result negative: Secondary | ICD-10-CM | POA: Diagnosis not present

## 2015-03-26 DIAGNOSIS — G43909 Migraine, unspecified, not intractable, without status migrainosus: Secondary | ICD-10-CM | POA: Diagnosis not present

## 2015-03-26 DIAGNOSIS — R079 Chest pain, unspecified: Secondary | ICD-10-CM

## 2015-03-26 DIAGNOSIS — R000 Tachycardia, unspecified: Secondary | ICD-10-CM | POA: Diagnosis not present

## 2015-03-26 DIAGNOSIS — R7989 Other specified abnormal findings of blood chemistry: Secondary | ICD-10-CM | POA: Diagnosis not present

## 2015-03-26 DIAGNOSIS — Z862 Personal history of diseases of the blood and blood-forming organs and certain disorders involving the immune mechanism: Secondary | ICD-10-CM | POA: Diagnosis not present

## 2015-03-26 DIAGNOSIS — Z791 Long term (current) use of non-steroidal anti-inflammatories (NSAID): Secondary | ICD-10-CM | POA: Diagnosis not present

## 2015-03-26 DIAGNOSIS — R110 Nausea: Secondary | ICD-10-CM | POA: Insufficient documentation

## 2015-03-26 DIAGNOSIS — R0602 Shortness of breath: Secondary | ICD-10-CM | POA: Diagnosis not present

## 2015-03-26 LAB — COMPREHENSIVE METABOLIC PANEL
ALT: 10 U/L — AB (ref 14–54)
AST: 12 U/L — ABNORMAL LOW (ref 15–41)
Albumin: 3.9 g/dL (ref 3.5–5.0)
Alkaline Phosphatase: 36 U/L — ABNORMAL LOW (ref 38–126)
Anion gap: 8 (ref 5–15)
BUN: 7 mg/dL (ref 6–20)
CHLORIDE: 104 mmol/L (ref 101–111)
CO2: 25 mmol/L (ref 22–32)
Calcium: 8.8 mg/dL — ABNORMAL LOW (ref 8.9–10.3)
Creatinine, Ser: 0.78 mg/dL (ref 0.44–1.00)
GFR calc Af Amer: >60 mL/min (ref >60)
GLUCOSE: 79 mg/dL (ref 65–99)
Potassium: 3.7 mmol/L (ref 3.5–5.1)
Sodium: 137 mmol/L (ref 135–145)
TOTAL PROTEIN: 6.7 g/dL (ref 6.5–8.1)
Total Bilirubin: 0.4 mg/dL (ref 0.3–1.2)

## 2015-03-26 LAB — CBC WITH DIFFERENTIAL/PLATELET
Basophils Absolute: 0 10*3/uL (ref 0.0–0.1)
Basophils Relative: 0 % (ref 0–1)
Eosinophils Absolute: 0 10*3/uL (ref 0.0–0.7)
Eosinophils Relative: 0 % (ref 0–5)
HCT: 34.2 % — ABNORMAL LOW (ref 36.0–46.0)
Hemoglobin: 10.9 g/dL — ABNORMAL LOW (ref 12.0–15.0)
LYMPHS ABS: 1 10*3/uL (ref 0.7–4.0)
Lymphocytes Relative: 16 % (ref 12–46)
MCH: 21.5 pg — ABNORMAL LOW (ref 26.0–34.0)
MCHC: 31.9 g/dL (ref 30.0–36.0)
MCV: 67.6 fL — AB (ref 78.0–100.0)
Monocytes Absolute: 0.3 10*3/uL (ref 0.1–1.0)
Monocytes Relative: 5 % (ref 3–12)
NEUTROS ABS: 4.6 10*3/uL (ref 1.7–7.7)
Neutrophils Relative %: 78 % — ABNORMAL HIGH (ref 43–77)
PLATELETS: 286 10*3/uL (ref 150–400)
RBC: 5.06 MIL/uL (ref 3.87–5.11)
RDW: 14.7 % (ref 11.5–15.5)
WBC: 5.9 10*3/uL (ref 4.0–10.5)

## 2015-03-26 LAB — D-DIMER, QUANTITATIVE (NOT AT ARMC): D DIMER QUANT: 0.82 ug{FEU}/mL — AB (ref 0.00–0.48)

## 2015-03-26 LAB — POC URINE PREG, ED: Preg Test, Ur: NEGATIVE

## 2015-03-26 LAB — LIPASE, BLOOD: Lipase: 16 U/L — ABNORMAL LOW (ref 22–51)

## 2015-03-26 MED ORDER — IBUPROFEN 400 MG PO TABS
600.0000 mg | ORAL_TABLET | Freq: Once | ORAL | Status: AC
  Administered 2015-03-26: 600 mg via ORAL
  Filled 2015-03-26 (×2): qty 1

## 2015-03-26 MED ORDER — ONDANSETRON HCL 4 MG/2ML IJ SOLN
4.0000 mg | Freq: Once | INTRAMUSCULAR | Status: AC
  Administered 2015-03-26: 4 mg via INTRAVENOUS
  Filled 2015-03-26: qty 2

## 2015-03-26 MED ORDER — MORPHINE SULFATE 4 MG/ML IJ SOLN
4.0000 mg | Freq: Once | INTRAMUSCULAR | Status: AC
  Administered 2015-03-26: 4 mg via INTRAVENOUS
  Filled 2015-03-26: qty 1

## 2015-03-26 MED ORDER — HYDROCODONE-ACETAMINOPHEN 5-325 MG PO TABS
2.0000 | ORAL_TABLET | Freq: Once | ORAL | Status: AC
  Administered 2015-03-26: 2 via ORAL
  Filled 2015-03-26: qty 2

## 2015-03-26 MED ORDER — HYDROCODONE-ACETAMINOPHEN 5-325 MG PO TABS: 1.0000 | ORAL_TABLET | Freq: Four times a day (QID) | ORAL | Status: DC | PRN

## 2015-03-26 MED ORDER — IOHEXOL 350 MG/ML SOLN
90.0000 mL | Freq: Once | INTRAVENOUS | Status: AC | PRN
  Administered 2015-03-26: 90 mL via INTRAVENOUS

## 2015-03-26 MED ORDER — IBUPROFEN 600 MG PO TABS: 600.0000 mg | ORAL_TABLET | Freq: Four times a day (QID) | ORAL | Status: DC | PRN

## 2015-03-26 NOTE — ED Notes (Signed)
Pt returned from xray

## 2015-03-26 NOTE — ED Notes (Signed)
Pt in xray

## 2015-03-26 NOTE — ED Notes (Signed)
Patient transported to CT 

## 2015-03-26 NOTE — Discharge Instructions (Signed)

## 2015-03-26 NOTE — ED Notes (Signed)
Patient transported to X-ray 

## 2015-03-26 NOTE — ED Provider Notes (Signed)
CSN: [id number]     Arrival date & time 03/26/15  1247 History   First MD Initiated Contact with Patient 03/26/15 1251     Chief Complaint  Patient presents with  . Chest Pain     (Consider location/radiation/quality/duration/timing/severity/associated sxs/prior Treatment) Patient is a 29 y.o. female presenting with chest pain.  Chest Pain Pain location:  L chest Pain quality: sharp   Radiates to: left arm, LUQ abdomen. Pain severity:  Severe Onset quality:  Gradual Duration:  3 days Timing:  Constant Progression:  Worsening Chronicity:  New Context comment:  Seen in urgent care yesterday, pain not better with muscle relaxer she was prescribed.  Relieved by:  Nothing Worsened by:  Deep breathing and movement Associated symptoms: nausea and shortness of breath   Associated symptoms: no diaphoresis and not vomiting     Past Medical History  Diagnosis Date  . Migraine   . Vitamin D deficiency   . 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 LFT elevation     unclear etiology; negative work up in 08/2011.   Marland Kitchen. Positive ANA (antinuclear antibody)     resolved when she was evaluated by Rheumatology   Past Surgical History  Procedure Laterality Date  . Wisdom tooth extraction     Family History  Problem Relation Age of Onset  . Thalassemia Mother   . Thalassemia Brother   . Cancer Maternal Grandmother     cervical cancer   History  Substance Use Topics  . Smoking status: Never Smoker   . Smokeless tobacco: Never Used  . Alcohol Use: No   OB History    Gravida Para Term Preterm AB TAB SAB Ectopic Multiple Living   2 2 2       2      Review of Systems  Constitutional: Negative for diaphoresis.  Respiratory: Positive for shortness of breath.   Cardiovascular: Positive for chest pain.  Gastrointestinal: Positive for nausea. Negative for vomiting.  All other systems reviewed and are negative.     Allergies  Percocet  Home  Medications   Prior to Admission medications   Medication Sig Start Date End Date Taking? Authorizing Provider  acetaminophen (TYLENOL) 500 MG tablet Take 1,000 mg by mouth daily as needed for mild pain.    Historical Provider, MD  cyclobenzaprine (FLEXERIL) 5 MG tablet Take 1 tablet (5 mg total) by mouth at bedtime as needed for muscle spasms. 01/18/15   Rodolph BongEvan S Corey, MD  diclofenac (CATAFLAM) 50 MG tablet Take 1 tablet (50 mg total) by mouth 3 (three) times daily. Prn chest pain 03/25/15   Linna HoffJames D Kindl, MD  diclofenac (VOLTAREN) 75 MG EC tablet Take 1 tablet (75 mg total) by mouth 2 (two) times daily as needed. 01/18/15   Rodolph BongEvan S Corey, MD  ibuprofen (ADVIL,MOTRIN) 800 MG tablet Take 800 mg by mouth every 8 (eight) hours as needed for headache.    Historical Provider, MD  ondansetron (ZOFRAN) 4 MG tablet Take 4 mg by mouth every 8 (eight) hours as needed for nausea or vomiting.    Historical Provider, MD  promethazine (PHENERGAN) 25 MG tablet Take 1 tablet (25 mg total) by mouth every 6 (six) hours as needed for nausea or vomiting. 09/21/14   Delbert PhenixLinda M Barefoot, NP  rizatriptan (MAXALT) 10 MG tablet Take 10 mg by mouth as needed for migraine. May repeat in 2 hours if needed    Historical Provider, MD  Topiramate ER 50 MG CP24 Take 50 mg by mouth at bedtime. 09/21/14   Delbert Phenix, NP  Topiramate ER 50 MG CP24 Take 1 capsule by mouth as directed. 10/21/14   Delbert Phenix, NP  traMADol (ULTRAM) 50 MG tablet Take 1 tablet (50 mg total) by mouth every 6 (six) hours as needed. 10/19/14   Delbert Phenix, NP  venlafaxine XR (EFFEXOR-XR) 75 MG 24 hr capsule Take 75 mg by mouth daily with breakfast.    Historical Provider, MD   BP 117/65 mmHg  Pulse 105  Temp(Src) 98.3 F (36.8 C) (Oral)  Resp 20  SpO2 99%  LMP 03/21/2015 Physical Exam  Constitutional: She is oriented to person, place, and time. She appears well-developed and well-nourished. No distress.  HENT:  Head: Normocephalic and  atraumatic.  Mouth/Throat: Oropharynx is clear and moist.  Eyes: Conjunctivae are normal. Pupils are equal, round, and reactive to light. No scleral icterus.  Neck: Neck supple.  Cardiovascular: Regular rhythm, normal heart sounds and intact distal pulses.  Tachycardia present.   No murmur heard. Pulmonary/Chest: Effort normal and breath sounds normal. No stridor. No respiratory distress. She has no wheezes. She has no rales.    Abdominal: Soft. Bowel sounds are normal. She exhibits no distension. There is no tenderness. There is no rigidity, no rebound and no guarding.  Musculoskeletal: Normal range of motion.  Neurological: She is alert and oriented to person, place, and time.  Skin: Skin is warm and dry. No rash noted.  Psychiatric: She has a normal mood and affect. Her behavior is normal.  Nursing note and vitals reviewed.   ED Course  Procedures (including critical care time) Labs Review Labs Reviewed  CBC WITH DIFFERENTIAL/PLATELET - Abnormal; Notable for the following:    Hemoglobin 10.9 (*)    HCT 34.2 (*)    MCV 67.6 (*)    MCH 21.5 (*)    Neutrophils Relative % 78 (*)    All other components within normal limits  D-DIMER, QUANTITATIVE - Abnormal; Notable for the following:    D-Dimer, Quant 0.82 (*)    All other components within normal limits  COMPREHENSIVE METABOLIC PANEL - Abnormal; Notable for the following:    Calcium 8.8 (*)    AST 12 (*)    ALT 10 (*)    Alkaline Phosphatase 36 (*)    All other components within normal limits  LIPASE, BLOOD - Abnormal; Notable for the following:    Lipase 16 (*)    All other components within normal limits  POC URINE PREG, ED    Imaging Review Dg Chest 2 View  03/26/2015   CLINICAL DATA:  Left upper chest pain for 2 days.  EXAM: CHEST  2 VIEW  COMPARISON:  September 06, 2014  FINDINGS: The heart size and mediastinal contours are within normal limits. There is no focal infiltrate, pulmonary edema, or pleural effusion. The  visualized skeletal structures are unremarkable.  IMPRESSION: No active cardiopulmonary disease.   Electronically Signed   By: Sherian Rein M.D.   On: 03/26/2015 14:27     EKG Interpretation   Date/Time:  Saturday Mar 26 2015 12:57:20 EDT Ventricular Rate:  107 PR Interval:  122 QRS Duration: 80 QT Interval:  322 QTC Calculation: 430 R Axis:   2 Text Interpretation:  Sinus tachycardia Borderline T abnormalities,  anterior leads Baseline wander in lead(s) V2 No significant change was  found Confirmed by [facility]  MD, TREY ([id number]) on 03/26/2015 1:03:37 PM  MDM   Final diagnoses:  Chest pain    29 yo female with left chest pain.  Appears to be msk in nature, but conservative treatment has not helped.  Pain also described as pleuritic with SOB.  Low risk for PE, but [facility].  D Dimer elevated, CT ordered.  If negative, I suspect MSK chest wall pain, likely costochondritis.  Symptoms are not consistent with ACS.  CT pending at time care transferred to Dr. Rhunette CroftNanavati.      Blake DivineJohn Tekila Caillouet, MD 03/26/15 [phone]

## 2015-03-26 NOTE — ED Notes (Signed)
Patient returned from CT angio.

## 2015-03-26 NOTE — ED Notes (Signed)
Pt c/o CP that increases with movement and breathing. Pt sts pain radiates from L shoulder, to L chest, to LUQ LLQ, RLQ.

## 2015-03-26 NOTE — ED Notes (Signed)
Pt here for left upper arm pain radiating to LUQ with swelling. [facility] and sts the pain is worse with nausea. sts some SOB. Denies cough. sts hurts worse with deep breath.

## 2015-05-31 ENCOUNTER — Emergency Department (HOSPITAL_BASED_OUTPATIENT_CLINIC_OR_DEPARTMENT_OTHER)
Admission: EM | Admit: 2015-05-31 | Discharge: 2015-05-31 | Disposition: A | Discharge status: Home / Self Care | Payer: Medicaid Other | Attending: Emergency Medicine | Admitting: Emergency Medicine

## 2015-05-31 ENCOUNTER — Encounter (HOSPITAL_BASED_OUTPATIENT_CLINIC_OR_DEPARTMENT_OTHER): Payer: Self-pay | Admitting: *Deleted

## 2015-05-31 DIAGNOSIS — G43909 Migraine, unspecified, not intractable, without status migrainosus: Secondary | ICD-10-CM | POA: Insufficient documentation

## 2015-05-31 DIAGNOSIS — Z862 Personal history of diseases of the blood and blood-forming organs and certain disorders involving the immune mechanism: Secondary | ICD-10-CM | POA: Insufficient documentation

## 2015-05-31 DIAGNOSIS — Z791 Long term (current) use of non-steroidal anti-inflammatories (NSAID): Secondary | ICD-10-CM | POA: Insufficient documentation

## 2015-05-31 DIAGNOSIS — L731 Pseudofolliculitis barbae: Secondary | ICD-10-CM | POA: Insufficient documentation

## 2015-05-31 DIAGNOSIS — Z8639 Personal history of other endocrine, nutritional and metabolic disease: Secondary | ICD-10-CM | POA: Insufficient documentation

## 2015-05-31 NOTE — Discharge Instructions (Signed)
No longer use that brand of nair if it bothers you.  Ingrown Hair An ingrown hair is a hair that curls and re-enters the skin instead of growing straight out of the skin. It happens most often with curly hair. It is usually more severe in the neck area, but it can occur in any shaved area, including the beard area, groin, scalp, and legs. An ingrown hair may cause small pockets of infection. CAUSES  Shaving closely, tweezing, or waxing, especially curly hair. Using hair removal creams can sometimes lead to ingrown hairs, especially in the groin. SYMPTOMS   Small bumps on the skin. The bumps may be filled with pus.  Pain.  Itching. DIAGNOSIS  Your caregiver can usually tell what is wrong by doing a physical exam. TREATMENT  If there is a severe infection, your caregiver may prescribe antibiotic medicines. Laser hair removal may also be done to help prevent regrowth of the hair. HOME CARE INSTRUCTIONS   Do not shave irritated skin. You may start shaving again once the irritation has gone away.  If you are prone to ingrown hairs, consider not shaving as much as possible.  If antibiotics are prescribed, take them as directed. Finish them even if you start to feel better.  You may use a facial sponge in a gentle circular motion to help dislodge ingrown hairs on the face.  You may use a hair removal cream weekly, especially on the legs and underarms. Stop using the cream if it irritates your skin. Use caution when using hair removal creams in the groin area. SHAVING INSTRUCTIONS AFTER TREATMENT  Shower before shaving. Keep areas to be shaved packed in warm, moist wraps for several minutes before shaving. The warm, moist environment helps soften the hairs and makes ingrown hairs less likely to occur.  Use thick shaving gels.  Use a bump fighter razor that cuts hair slightly above the skin level or use an electric shaver with a longer shave setting.  Shave in the direction of hair growth.  Avoid making multiple razor strokes.  Use moisturizing lotions after shaving. Document Released: 02/04/2001 Document Revised: 04/29/2012 Document Reviewed: 01/29/2012 [facility] Patient Information 2015 [employer] [place], [place]. This information is not intended to replace advice given to you by your health care provider. Make sure you discuss any questions you have with your health care provider.

## 2015-05-31 NOTE — ED Provider Notes (Signed)
CSN: [id number]     Arrival date & time 05/31/15  1123 History   First MD Initiated Contact with Patient 05/31/15 1203     Chief Complaint  Patient presents with  . Rash     (Consider location/radiation/quality/duration/timing/severity/associated sxs/prior Treatment) HPI Comments: 29 year old female presenting with concerns that her rash to her pubic area 4 days. States she used a new brand of nair, and the next day, developed one small bump. She went onto google and was concerned that she may have developed a severe rash. No aggravating or alleviating factors. The rash is not itchy or painful. No fevers. No contacts with similar rash.  Patient is a 29 y.o. female presenting with rash. The history is provided by the patient.  Rash Location:  Ano-genital Ano-genital rash location: mons pubis. Quality: not burning and not itchy   Severity:  Mild Onset quality:  Gradual Duration:  4 days Timing:  Constant Progression:  Unchanged Chronicity:  New Context comment:  Nair product Relieved by:  None tried Worsened by:  Nothing tried Ineffective treatments:  None tried Associated symptoms: no fever     Past Medical History  Diagnosis Date  . Migraine   . Vitamin D deficiency   . 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 LFT elevation     unclear etiology; negative work up in 08/2011.   Marland Kitchen. Positive ANA (antinuclear antibody)     resolved when she was evaluated by Rheumatology   Past Surgical History  Procedure Laterality Date  . Wisdom tooth extraction     Family History  Problem Relation Age of Onset  . Thalassemia Mother   . Thalassemia Brother   . Cancer Maternal Grandmother     cervical cancer   History  Substance Use Topics  . Smoking status: Never Smoker   . Smokeless tobacco: Never Used  . Alcohol Use: No   OB History    Gravida Para Term Preterm AB TAB SAB Ectopic Multiple Living   2 2 2       2      Review of Systems   Constitutional: Negative for fever.  Skin: Positive for rash.      Allergies  Percocet and Topamax  Home Medications   Prior to Admission medications   Medication Sig Start Date End Date Taking? Authorizing Provider  cyclobenzaprine (FLEXERIL) 5 MG tablet Take 1 tablet (5 mg total) by mouth at bedtime as needed for muscle spasms. Patient taking differently: Take 5 mg by mouth at bedtime as needed for muscle spasms.  01/18/15   Rodolph BongEvan S Corey, MD  diclofenac (CATAFLAM) 50 MG tablet Take 1 tablet (50 mg total) by mouth 3 (three) times daily. Prn chest pain Patient taking differently: Take 50 mg by mouth 3 (three) times daily as needed (chest pain).  03/25/15   Linna HoffJames D Kindl, MD  diclofenac (VOLTAREN) 75 MG EC tablet Take 1 tablet (75 mg total) by mouth 2 (two) times daily as needed. Patient not taking: Reported on 03/26/2015 01/18/15   Rodolph BongEvan S Corey, MD  HYDROcodone-acetaminophen (NORCO/VICODIN) 5-325 MG per tablet Take 1 tablet by mouth every 6 (six) hours as needed. 03/26/15   Derwood KaplanAnkit Nanavati, MD  ibuprofen (ADVIL,MOTRIN) 600 MG tablet Take 1 tablet (600 mg total) by mouth every 6 (six) hours as needed. 03/26/15   Derwood KaplanAnkit Nanavati, MD  promethazine (PHENERGAN) 25 MG tablet Take 1 tablet (25 mg total) by mouth every 6 (six) hours as  needed for nausea or vomiting. Patient not taking: Reported on 03/26/2015 09/21/14   Delbert Phenix, NP  Topiramate ER 50 MG CP24 Take 50 mg by mouth at bedtime. Patient not taking: Reported on 03/26/2015 09/21/14   Delbert Phenix, NP  Topiramate ER 50 MG CP24 Take 1 capsule by mouth as directed. Patient not taking: Reported on 03/26/2015 10/21/14   Delbert Phenix, NP  traMADol (ULTRAM) 50 MG tablet Take 1 tablet (50 mg total) by mouth every 6 (six) hours as needed. Patient not taking: Reported on 03/26/2015 10/19/14   Delbert Phenix, NP   BP 127/77 mmHg  Pulse 107  Temp(Src) 98.5 F (36.9 C) (Oral)  Resp 20  Ht 5' 1.5" (1.562 m)  Wt 179 lb (81.194 kg)  BMI  33.28 kg/m2  SpO2 100%  LMP 05/13/2015 Physical Exam  Constitutional: She is oriented to person, place, and time. She appears well-developed and well-nourished. No distress.  HENT:  Head: Normocephalic and atraumatic.  Mouth/Throat: Oropharynx is clear and moist.  Eyes: Conjunctivae and EOM are normal.  Neck: Normal range of motion. Neck supple.  Cardiovascular: Normal rate, regular rhythm and normal heart sounds.   Pulmonary/Chest: Effort normal and breath sounds normal. No respiratory distress.  Musculoskeletal: Normal range of motion. She exhibits no edema.  Neurological: She is alert and oriented to person, place, and time. No sensory deficit.  Skin: Skin is warm and dry.  One small 1 mm ingrown hair follicle on mons pubis. No erythema, drainage, fluctuance or evidence of abscess or cellulitis.  Psychiatric: She has a normal mood and affect. Her behavior is normal.  Nursing note and vitals reviewed.   ED Course  Procedures (including critical care time) Labs Review Labs Reviewed - No data to display  Imaging Review No results found.   EKG Interpretation None      MDM   Final diagnoses:  Ingrown hair   NAD. One small ingrown hair follicle noted. No itching or pain. Advised against the nair if it concerns her. No secondary infection. No abscess or signs of folliculitis. Stable for d/c. Return precautions given. Patient states understanding of treatment care plan and is agreeable.  Kathrynn Speed, PA-C 05/31/15 1226  Jerelyn Scott, MD 05/31/15 1228

## 2015-05-31 NOTE — ED Notes (Signed)
States she used Darene LamerNair hair removal product yesterday and now has a rash on her pubic region.

## 2015-12-09 IMAGING — CT CT ANGIO CHEST
2 of 11 series · 18 of 46 positions shown · IV contrast (OMNI)
Comparison: Chest x-ray March 26, 2015

CLINICAL DATA: Chest pain since [REDACTED], pain on inspiration.

EXAM:
CT ANGIOGRAPHY CHEST WITH CONTRAST
TECHNIQUE: Multidetector CT imaging of the chest was performed using the
standard protocol during bolus administration of intravenous
contrast. Multiplanar CT image reconstructions and MIPs were
obtained to evaluate the vascular anatomy.
CONTRAST:  90mL OMNIPAQUE IOHEXOL 350 MG/ML SOLN

[Series 7: thins · axial · 0.63mm/px · z∈[+1052,+1254]mm · 15 of 230 slices shown]
[im 14/230  lung]
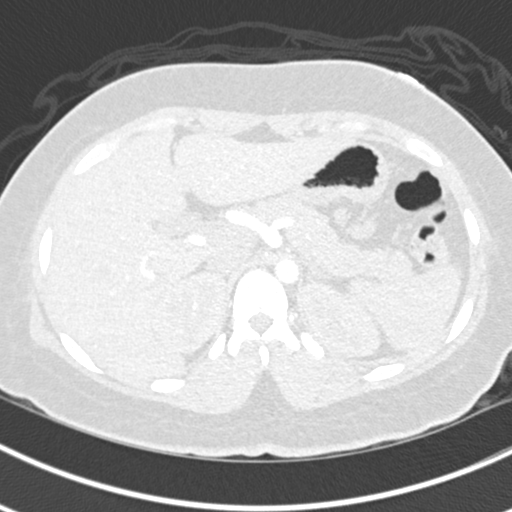
[im 27/230  soft-tissue]
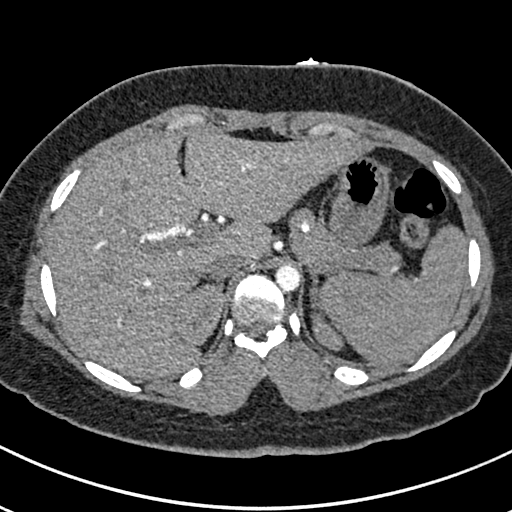
[im 41/230  lung]
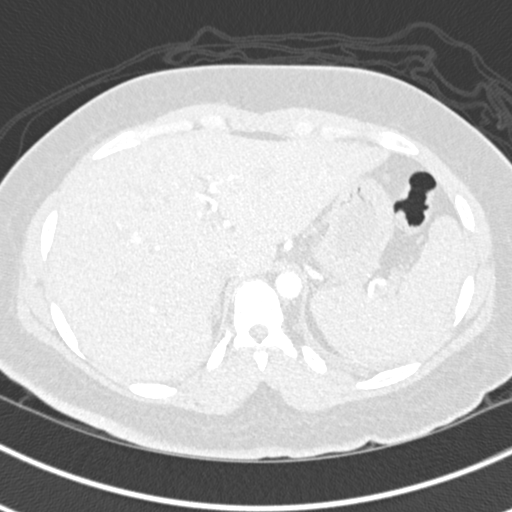
[im 54/230  soft-tissue]
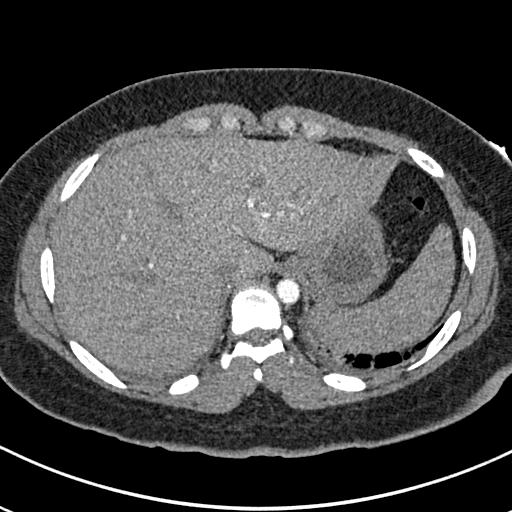
[im 68/230  lung]
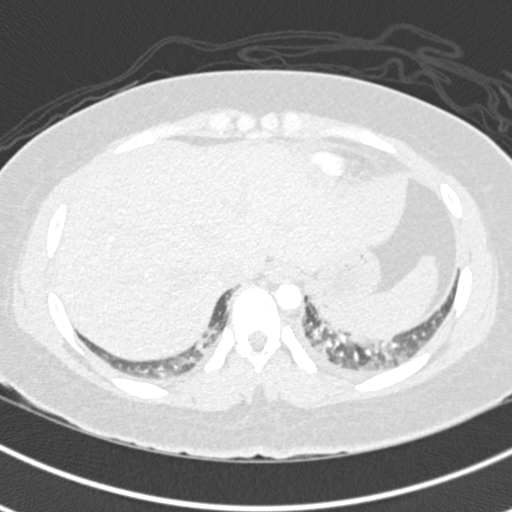
[im 81/230  soft-tissue]
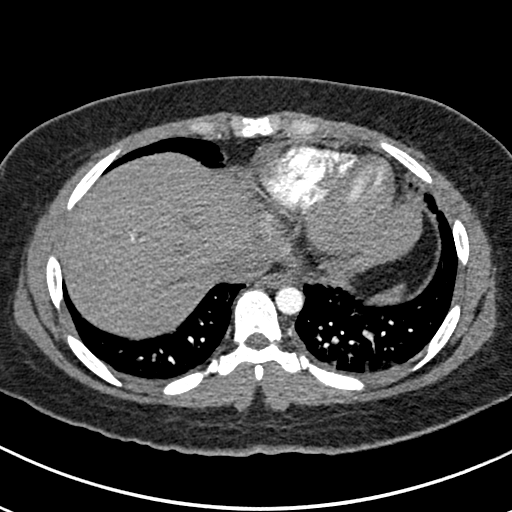
[im 95/230  lung]
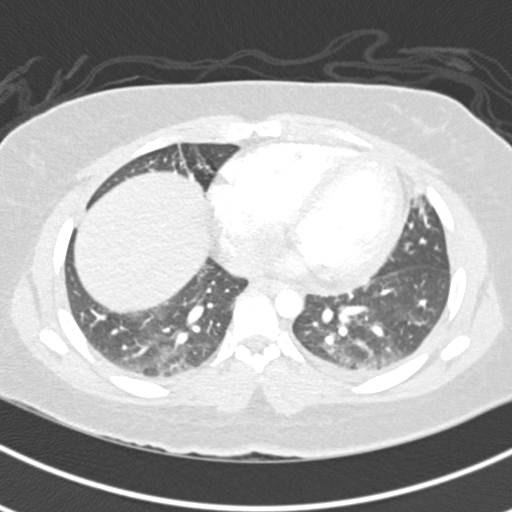
[im 122/230  soft-tissue]
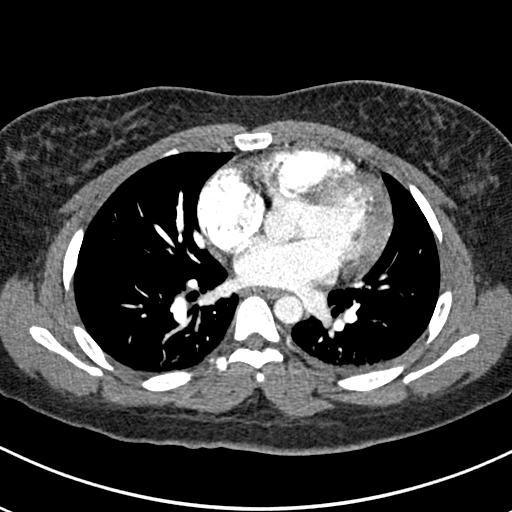
[im 135/230  lung]
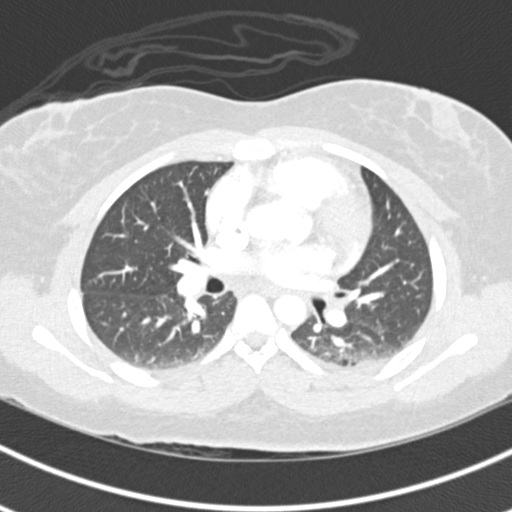
[im 149/230  soft-tissue]
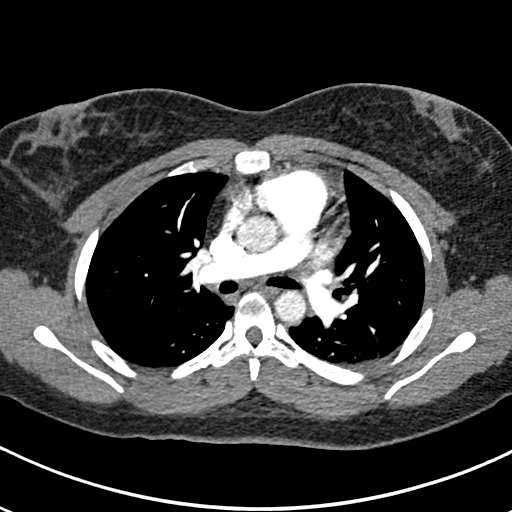
[im 162/230  lung]
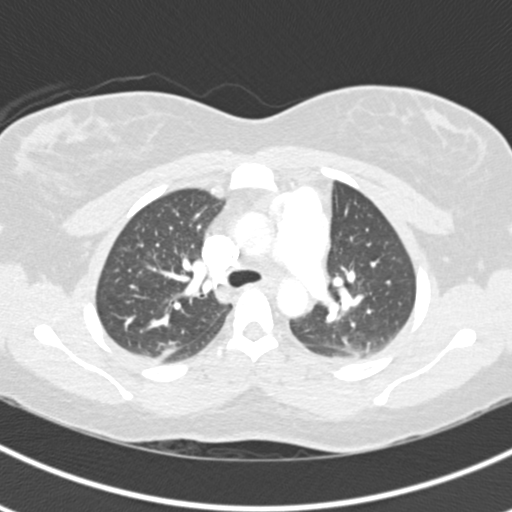
[im 176/230  soft-tissue]
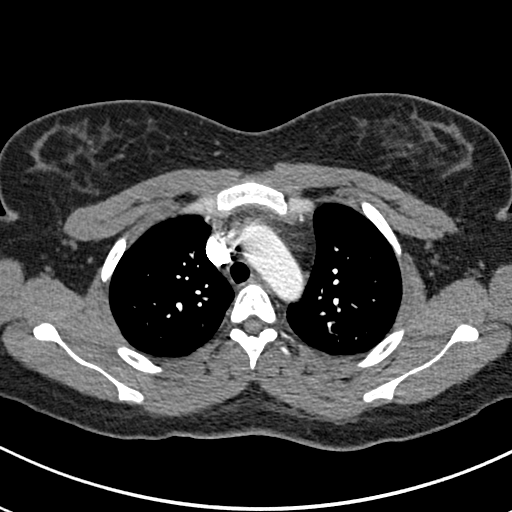
[im 189/230  lung]
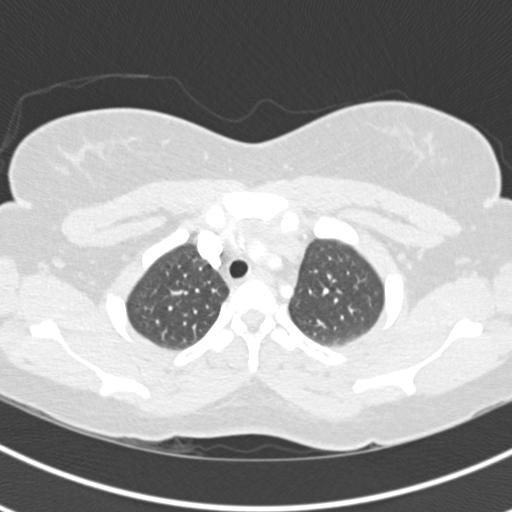
[im 203/230  soft-tissue]
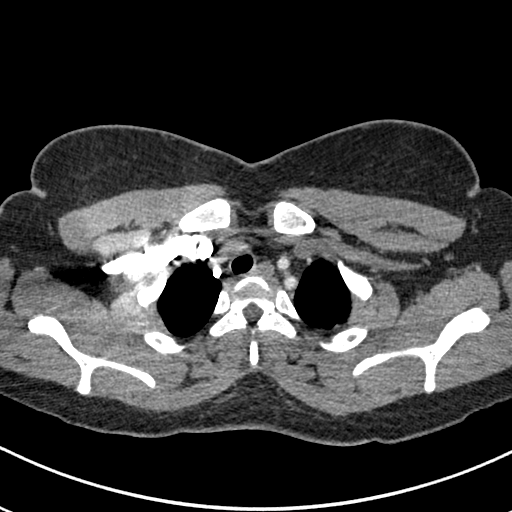
[im 216/230  lung]
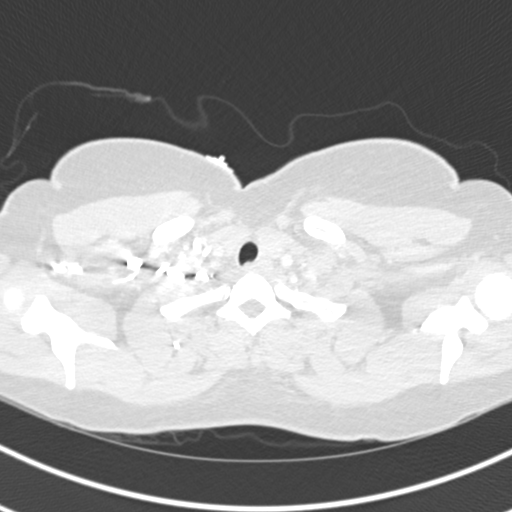

[Series 9: coronal mpr · coronal · 0.45mm/px · 3 of 119 slices shown]
[im 30/119  soft-tissue]
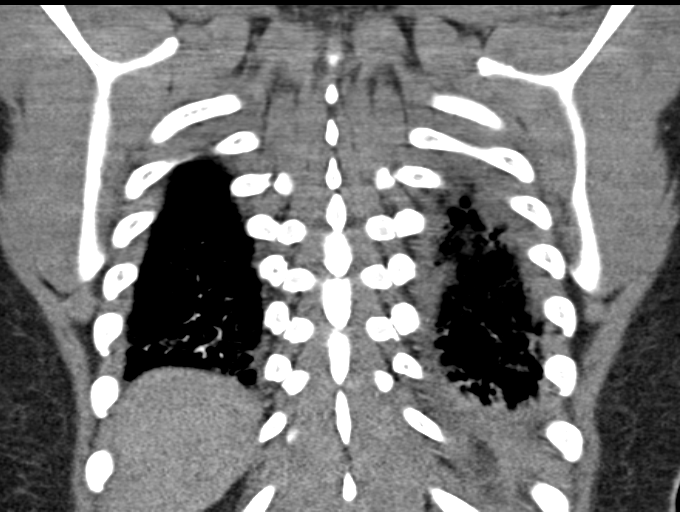
[im 60/119  soft-tissue]
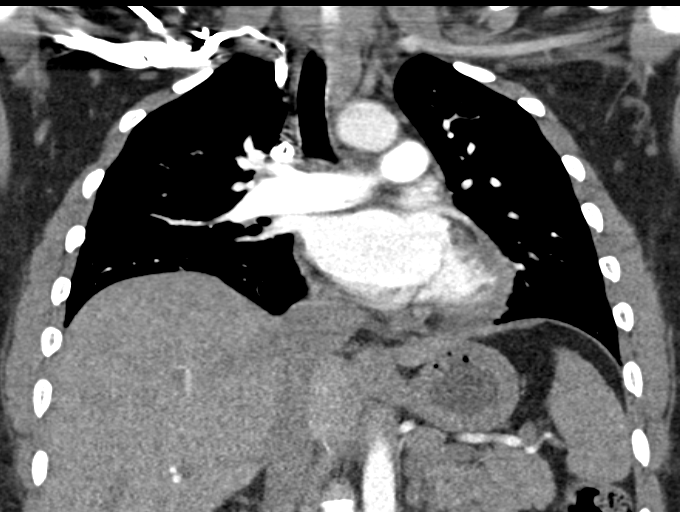
[im 89/119  soft-tissue]
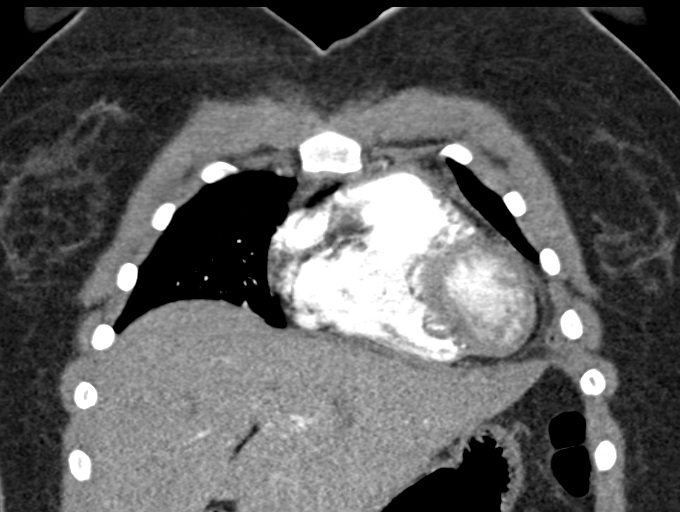

[18 of 46 positions shown; findings below may reference images not displayed]

FINDINGS: There is no pulmonary embolus. The aorta and great vessels are
normal. The heart size is normal. There is no pericardial effusion.
There is no mediastinal or hilar lymphadenopathy. There are minimal
bilateral pleural effusions with mild atelectasis of bilateral
posterior lung bases. There is no focal pneumonia. The visualized
upper abdominal structures are normal.

Review of the MIP images confirms the above findings.
IMPRESSION: No pulmonary embolus.

Minimal bilateral pleural effusions with mild atelectasis of the
posterior lung bases.

## 2016-03-31 ENCOUNTER — Emergency Department (HOSPITAL_COMMUNITY): Payer: BLUE CROSS/BLUE SHIELD

## 2016-03-31 ENCOUNTER — Encounter (HOSPITAL_COMMUNITY): Payer: Self-pay

## 2016-03-31 ENCOUNTER — Emergency Department (HOSPITAL_COMMUNITY)
Admission: EM | Admit: 2016-03-31 | Discharge: 2016-03-31 | Disposition: A | Discharge status: Home / Self Care | Payer: BLUE CROSS/BLUE SHIELD | Attending: Emergency Medicine | Admitting: Emergency Medicine

## 2016-03-31 DIAGNOSIS — Y9389 Activity, other specified: Secondary | ICD-10-CM | POA: Diagnosis not present

## 2016-03-31 DIAGNOSIS — W108XXA Fall (on) (from) other stairs and steps, initial encounter: Secondary | ICD-10-CM | POA: Insufficient documentation

## 2016-03-31 DIAGNOSIS — S20222A Contusion of left back wall of thorax, initial encounter: Secondary | ICD-10-CM

## 2016-03-31 DIAGNOSIS — Y998 Other external cause status: Secondary | ICD-10-CM | POA: Insufficient documentation

## 2016-03-31 DIAGNOSIS — Z8639 Personal history of other endocrine, nutritional and metabolic disease: Secondary | ICD-10-CM | POA: Insufficient documentation

## 2016-03-31 DIAGNOSIS — G43909 Migraine, unspecified, not intractable, without status migrainosus: Secondary | ICD-10-CM | POA: Insufficient documentation

## 2016-03-31 DIAGNOSIS — Z862 Personal history of diseases of the blood and blood-forming organs and certain disorders involving the immune mechanism: Secondary | ICD-10-CM | POA: Insufficient documentation

## 2016-03-31 DIAGNOSIS — Y9289 Other specified places as the place of occurrence of the external cause: Secondary | ICD-10-CM | POA: Insufficient documentation

## 2016-03-31 DIAGNOSIS — S29002A Unspecified injury of muscle and tendon of back wall of thorax, initial encounter: Secondary | ICD-10-CM | POA: Diagnosis present

## 2016-03-31 MED ORDER — IBUPROFEN 600 MG PO TABS: 600.0000 mg | ORAL_TABLET | Freq: Four times a day (QID) | ORAL | Status: DC | PRN

## 2016-03-31 MED ORDER — IBUPROFEN 400 MG PO TABS
800.0000 mg | ORAL_TABLET | Freq: Once | ORAL | Status: AC
  Administered 2016-03-31: 800 mg via ORAL
  Filled 2016-03-31: qty 2

## 2016-03-31 MED ORDER — METHOCARBAMOL 500 MG PO TABS: 500.0000 mg | ORAL_TABLET | Freq: Two times a day (BID) | ORAL | Status: DC

## 2016-03-31 NOTE — ED Notes (Signed)
Declined W/C at D/C and was escorted to lobby by RN. 

## 2016-03-31 NOTE — ED Provider Notes (Signed)
History  By signing my name below, I, Jill Hobbs, attest that this documentation has been prepared under the direction and in the presence of Jill Hobbs Emaya Preston, PA-C. Electronically Signed: Earmon PhoenixJennifer Hobbs, ED Scribe. 03/31/2016. 5:48 PM.  Chief Complaint  Patient presents with  . Back Pain   The history is provided by the patient and medical records. No language interpreter was used.    HPI Comments:  Jill Hobbs is a 30 y.o. female who presents to the Emergency Department complaining of a fall that occurred about 8 days ago. She states she fell down 8 stairs and has been experiencing left-sided mid to lower back pain. She states the pain is beginning to worsen. Pt reports an associated bruise on the back that has since gone away. She denies any modifying factors. She has been taking Ibuprofen and Tylenol and applying heat compresses with minimal relief of the pain. She denies head trauma, LOC, dysuria, hematuria, fever, chills, light-headedness, dizziness, numbness, tingling or weakness of any extremity, bowel or bladder incontinence or saddle anesthesia, cp or abd pain. 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 She reports allergies to Percocet and Topamax.   Past Medical History  Diagnosis Date  . Migraine   . Vitamin D deficiency   . Beta thalassemia (HCC)     presumed beta thal minor; never needed pRBC transfusion  . LFT elevation     unclear etiology; negative work up in 08/2011.   Marland Kitchen. Positive ANA (antinuclear antibody)     resolved when she was evaluated by Rheumatology   Past Surgical History  Procedure Laterality Date  . Wisdom tooth extraction     Family History  Problem Relation Age of Onset  . Thalassemia Mother   . Thalassemia Brother   . Cancer Maternal Grandmother     cervical cancer   Social History  Substance Use Topics  . Smoking status: Never Smoker   . Smokeless tobacco: Never Used  . Alcohol Use: No   OB History    Gravida Para Term  Preterm AB TAB SAB Ectopic Multiple Living   2 2 2       2      Review of Systems  Constitutional: Negative for fever and chills.  Genitourinary:       No bowel or bladder incontinence  Musculoskeletal: Positive for back pain.  Skin: Negative for color change and wound.  Neurological: Negative for dizziness, syncope, weakness, light-headedness and numbness.    Allergies  Percocet and Topamax  Home Medications   Prior to Admission medications   Medication Sig Start Date End Date Taking? Authorizing Provider  cyclobenzaprine (FLEXERIL) 5 MG tablet Take 1 tablet (5 mg total) by mouth at bedtime as needed for muscle spasms. Patient taking differently: Take 5 mg by mouth at bedtime as needed for muscle spasms.  01/18/15   Rodolph BongEvan S Corey, MD  diclofenac (CATAFLAM) 50 MG tablet Take 1 tablet (50 mg total) by mouth 3 (three) times daily. Prn chest pain Patient taking differently: Take 50 mg by mouth 3 (three) times daily as needed (chest pain).  03/25/15   Linna HoffJames D Kindl, MD  HYDROcodone-acetaminophen (NORCO/VICODIN) 5-325 MG per tablet Take 1 tablet by mouth every 6 (six) hours as needed. 03/26/15   Derwood KaplanAnkit Nanavati, MD  ibuprofen (ADVIL,MOTRIN) 600 MG tablet Take 1 tablet (600 mg total) by mouth every 6 (six) hours as needed. 03/31/16   Jill Hobbs Shaquala Broeker, PA-C  methocarbamol (ROBAXIN) 500 MG tablet Take 1 tablet (500 mg  total) by mouth 2 (two) times daily. 03/31/16   Jill Helper, PA-C   Triage Vitals: BP 107/71 mmHg  Pulse 105  Temp(Src) 98.2 F (36.8 C) (Oral)  Resp 18  Ht  (1.549 m)  Wt 150 lb (68.04 kg)  BMI 28.36 kg/m2  SpO2 99%  LMP 03/07/2016 Physical Exam  Constitutional: She is oriented to person, place, and time. She appears well-developed and well-nourished.  HENT:  Head: Normocephalic and atraumatic.  Eyes: EOM are normal.  Neck: Normal range of motion.  Cardiovascular: Normal rate.   Pulmonary/Chest: Effort normal.  Musculoskeletal: Normal range of motion. She exhibits  tenderness.  Tenderness to palpation to left paraspinal thoracic region near T 10-T 12 without any obvious bruising or swelling. No significant midline spine tenderness, crepitus or step off.  Neurological: She is alert and oriented to person, place, and time.  Skin: Skin is warm and dry.  Psychiatric: She has a normal mood and affect. Her behavior is normal.  Nursing note and vitals reviewed.   ED Course  Procedures (including critical care time) DIAGNOSTIC STUDIES: Oxygen Saturation is 99% on RA, normal by my interpretation.   COORDINATION OF CARE: 4:18 PM- mechanicall fall 8 days ago.  Tenderness to L parathoracic spinal muscles.  Pt requesting xrays.  Will X-Ray thoracic spine. Pt verbalizes understanding and agrees to plan.  Medications - No data to display  Labs Review Labs Reviewed - No data to display  Imaging Review Dg Thoracic Spine 2 View  03/31/2016  CLINICAL DATA:  Fall x1 week, severe upper back pain EXAM: THORACIC SPINE 2 VIEWS COMPARISON:  CTA chest dated 03/26/2015 FINDINGS: Normal thoracic kyphosis. No evidence of fracture or dislocation. Vertebral body heights and intervertebral disc spaces are maintained. Visualized lungs are clear. IMPRESSION: No fracture or dislocation is seen. Electronically Signed   By: Charline Bills M.D.   On: 03/31/2016 17:21   I have personally reviewed and evaluated these images and lab results as part of my medical decision-making.   EKG Interpretation None      MDM   Final diagnoses:  Back contusion, left, initial encounter    BP 107/71 mmHg  Pulse 105  Temp(Src) 98.2 F (36.8 C) (Oral)  Resp 18  Ht  (1.549 m)  Wt 150 lb (68.04 kg)  BMI 28.36 kg/m2  SpO2 99%  LMP 03/07/2016  Patient with back pain.  No neurological deficits and normal neuro exam.  Patient is ambulatory.  No loss of bowel or bladder control.  No concern for cauda equina.  No fever, night sweats, weight loss, h/o cancer, IVDA, no recent procedure  to back. No urinary symptoms suggestive of UTI.  Supportive care and return precaution discussed. Appears safe for discharge at this time. Follow up as indicated in discharge paperwork.    I personally performed the services described in this documentation, which was scribed in my presence. The recorded information has been reviewed and is accurate.       Jill Helper, PA-C 03/31/16 1750  Arby Barrette, MD 04/05/16 [phone]

## 2016-03-31 NOTE — ED Notes (Signed)
Patient fell while walking down steps last week striking \\back . Complains of ongoing left sided thoracic back pain. ambulatory

## 2016-03-31 NOTE — Discharge Instructions (Signed)

## 2016-07-27 ENCOUNTER — Institutional Professional Consult (permissible substitution): Payer: BLUE CROSS/BLUE SHIELD | Admitting: Physician Assistant

## 2016-07-27 DIAGNOSIS — R51 Headache: Secondary | ICD-10-CM

## 2016-07-31 ENCOUNTER — Encounter (HOSPITAL_COMMUNITY): Payer: Self-pay | Admitting: *Deleted

## 2016-07-31 ENCOUNTER — Ambulatory Visit (HOSPITAL_COMMUNITY)
Admission: EM | Admit: 2016-07-31 | Discharge: 2016-07-31 | Disposition: A | Discharge status: Home / Self Care | Payer: BLUE CROSS/BLUE SHIELD | Attending: Family Medicine | Admitting: Family Medicine

## 2016-07-31 DIAGNOSIS — H5789 Other specified disorders of eye and adnexa: Secondary | ICD-10-CM

## 2016-07-31 DIAGNOSIS — H578 Other specified disorders of eye and adnexa: Secondary | ICD-10-CM

## 2016-07-31 DIAGNOSIS — H109 Unspecified conjunctivitis: Secondary | ICD-10-CM

## 2016-07-31 NOTE — ED Notes (Signed)
20/70  r   20/40  l

## 2016-07-31 NOTE — Discharge Instructions (Signed)
Go directly to eye doctor °

## 2016-07-31 NOTE — ED Provider Notes (Addendum)
[facility]    CSN: [id number] Arrival date & time: 07/31/16  1306  First Provider Contact:  First MD Initiated Contact with Patient 07/31/16 1433        History   Chief Complaint Chief Complaint  Patient presents with  . Eye Problem    HPI Jill Hobbs is a 30 y.o. female.   The history is provided by the patient.  Eye Problem  Location:  Right eye Quality:  Burning Severity:  Mild Onset quality:  Gradual Duration:  3 days Progression:  Unchanged Chronicity:  New Context: not contact lens problem and not direct trauma   Relieved by:  None tried Worsened by:  Nothing Associated symptoms: redness   Associated symptoms: no discharge     Past Medical History:  Diagnosis Date  . 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 LFT elevation    unclear etiology; negative work up in 08/2011.   . Migraine   . Positive ANA (antinuclear antibody)    resolved when she was evaluated by Rheumatology  . Vitamin D deficiency     Patient Active Problem List   Diagnosis Date Noted  . Obesity 09/21/2014  . Migraine without aura 05/25/2014  . Migraine   . Vitamin D deficiency   . Beta thalassemia (HCC)   . LFT elevation   . Positive ANA (antinuclear antibody)     Past Surgical History:  Procedure Laterality Date  . WISDOM TOOTH EXTRACTION      OB History    Gravida Para Term Preterm AB Living   2 2 2     2    SAB TAB Ectopic Multiple Live Births                   Home Medications    Prior to Admission medications   Medication Sig Start Date End Date Taking? Authorizing Provider  cyclobenzaprine (FLEXERIL) 5 MG tablet Take 1 tablet (5 mg total) by mouth at bedtime as needed for muscle spasms. Patient taking differently: Take 5 mg by mouth at bedtime as needed for muscle spasms.  01/18/15   Rodolph Bong, MD  diclofenac (CATAFLAM) 50 MG tablet Take 1 tablet (50 mg total) by mouth 3 (three) times daily. Prn  chest pain Patient taking differently: Take 50 mg by mouth 3 (three) times daily as needed (chest pain).  03/25/15   Linna Hoff, MD  HYDROcodone-acetaminophen (NORCO/VICODIN) 5-325 MG per tablet Take 1 tablet by mouth every 6 (six) hours as needed. 03/26/15   Derwood Kaplan, MD  ibuprofen (ADVIL,MOTRIN) 600 MG tablet Take 1 tablet (600 mg total) by mouth every 6 (six) hours as needed. 03/31/16   Fayrene Helper, PA-C  methocarbamol (ROBAXIN) 500 MG tablet Take 1 tablet (500 mg total) by mouth 2 (two) times daily. 03/31/16   Fayrene Helper, PA-C    Family History Family History  Problem Relation Age of Onset  . Thalassemia Mother   . Thalassemia Brother   . Cancer Maternal Grandmother     cervical cancer    Social History Social History  Substance Use Topics  . Smoking status: Never Smoker  . Smokeless tobacco: Never Used  . Alcohol use No     Allergies   Percocet [oxycodone-acetaminophen] and Topamax [topiramate]   Review of Systems Review of Systems  Constitutional: Negative.   HENT: Negative.   Eyes: Positive for redness and visual disturbance. Negative for discharge.  Respiratory: Negative.  All other systems reviewed and are negative.    Physical Exam Triage Vital Signs ED Triage Vitals  Enc Vitals Group     BP 07/31/16 1432 125/76     Pulse Rate 07/31/16 1424 80     Resp 07/31/16 1424 18     Temp 07/31/16 1424 98.6 F (37 C)     Temp Source 07/31/16 1432 Oral     SpO2 07/31/16 1424 100 %     Weight --      Height --      Head Circumference --      Peak Flow --      Pain Score --      Pain Loc --      Pain Edu? --      Excl. in GC? --    No data found.   Updated Vital Signs BP 125/76 (BP Location: Right Arm)   Pulse 72   Temp 98.6 F (37 C) (Oral)   Resp 18   LMP 07/30/2016   SpO2 100%   Visual Acuity Right Eye Distance:   Left Eye Distance:   Bilateral Distance:    Right Eye Near:   Left Eye Near:    Bilateral Near:     Physical Exam    Constitutional: She appears well-developed and well-nourished.  Eyes: EOM and lids are normal. Pupils are equal, round, and reactive to light. Lids are everted and swept, no foreign bodies found. Right eye exhibits no discharge and no exudate. Right conjunctiva is injected. Right conjunctiva has no hemorrhage. Left conjunctiva is not injected. Left conjunctiva has no hemorrhage.  Slit lamp exam:      The right eye shows no corneal abrasion, no corneal flare, no foreign body and no fluorescein uptake.       The left eye shows no corneal abrasion and no fluorescein uptake.  Nursing note and vitals reviewed.    UC Treatments / Results  Labs (all labs ordered are listed, but only abnormal results are displayed) Labs Reviewed - No data to display  EKG  EKG Interpretation None       Radiology No results found.  Procedures Procedures (including critical care time)  Medications Ordered in UC Medications - No data to display   Initial Impression / Assessment and Plan / UC Course  I have reviewed the triage vital signs and the nursing notes.  Pertinent labs & imaging results that were available during my care of the patient were reviewed by me and considered in my medical decision making (see chart for details).  Clinical Course   Discussed with dr c, shah, will see directly.   Final Clinical Impressions(s) / UC Diagnoses   Final diagnoses:  None    New Prescriptions New Prescriptions   No medications on file     Linna HoffJames D Hamzeh Tall, MD 07/31/16 1451    Linna HoffJames D Eugina Row, MD 07/31/16 [phone]

## 2016-07-31 NOTE — ED Triage Notes (Signed)
Pt    Reports       r  Eye        irritatted  And  Red       With   Symptoms  Started  After  Being outside       denys  Any  specfic

## 2016-12-13 ENCOUNTER — Encounter: Payer: Self-pay | Admitting: General Practice

## 2017-04-23 ENCOUNTER — Emergency Department (HOSPITAL_COMMUNITY)
Admission: EM | Admit: 2017-04-23 | Discharge: 2017-04-23 | Disposition: A | Discharge status: Home / Self Care | Payer: BLUE CROSS/BLUE SHIELD | Attending: Emergency Medicine | Admitting: Emergency Medicine

## 2017-04-23 ENCOUNTER — Encounter (HOSPITAL_COMMUNITY): Payer: Self-pay

## 2017-04-23 DIAGNOSIS — R252 Cramp and spasm: Secondary | ICD-10-CM

## 2017-04-23 DIAGNOSIS — M79604 Pain in right leg: Secondary | ICD-10-CM | POA: Diagnosis present

## 2017-04-23 LAB — CBC WITH DIFFERENTIAL/PLATELET
Basophils Absolute: 0 10*3/uL (ref 0.0–0.1)
Basophils Relative: 0 %
EOS PCT: 3 %
Eosinophils Absolute: 0.2 10*3/uL (ref 0.0–0.7)
HCT: 35.6 % — ABNORMAL LOW (ref 36.0–46.0)
Hemoglobin: 11.2 g/dL — ABNORMAL LOW (ref 12.0–15.0)
LYMPHS ABS: 1.7 10*3/uL (ref 0.7–4.0)
Lymphocytes Relative: 27 %
MCH: 21.7 pg — ABNORMAL LOW (ref 26.0–34.0)
MCHC: 31.5 g/dL (ref 30.0–36.0)
MCV: 69.1 fL — ABNORMAL LOW (ref 78.0–100.0)
MONO ABS: 0.4 10*3/uL (ref 0.1–1.0)
Monocytes Relative: 6 %
Neutro Abs: 4 10*3/uL (ref 1.7–7.7)
Neutrophils Relative %: 64 %
Platelets: 342 10*3/uL (ref 150–400)
RBC: 5.15 MIL/uL — ABNORMAL HIGH (ref 3.87–5.11)
RDW: 14.7 % (ref 11.5–15.5)
WBC: 6.3 10*3/uL (ref 4.0–10.5)

## 2017-04-23 LAB — BASIC METABOLIC PANEL
ANION GAP: 6 (ref 5–15)
BUN: 7 mg/dL (ref 6–20)
CHLORIDE: 105 mmol/L (ref 101–111)
CO2: 26 mmol/L (ref 22–32)
Calcium: 9 mg/dL (ref 8.9–10.3)
Creatinine, Ser: 0.65 mg/dL (ref 0.44–1.00)
GFR calc non Af Amer: >60 mL/min (ref >60)
Glucose, Bld: 88 mg/dL (ref 65–99)
Potassium: 3.3 mmol/L — ABNORMAL LOW (ref 3.5–5.1)
Sodium: 137 mmol/L (ref 135–145)

## 2017-04-23 MED ORDER — METHOCARBAMOL 500 MG PO TABS: 500.0000 mg | ORAL_TABLET | Freq: Two times a day (BID) | ORAL | 0 refills | Status: DC | PRN

## 2017-04-23 NOTE — ED Provider Notes (Signed)
[facility]   CSN: [id number] Arrival date & time: 04/23/17  [id number]     History   Chief Complaint No chief complaint on file.   HPI Jill Hobbs is a 31 y.o. female.  This is a 31 year old female who reports that she gets muscle cramps in her right leg.  They usually start in her foot and radiating up into her calf, but the last couple episodes.  It radiated into her thigh. She is able to get the cramps released by stretching exercises after the cramps.  She is left with muscle soreness. She reported this to the doctor at her job today who sent her for immediate evaluation to rule out blood clot in her leg  Patient denies any trauma to the leg, any recent illnesses.  No travel, no swelling      Past Medical History:  Diagnosis Date  . 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 LFT elevation    unclear etiology; negative work up in 08/2011.   . Migraine   . Positive ANA (antinuclear antibody)    resolved when she was evaluated by Rheumatology  . Vitamin D deficiency     Patient Active Problem List   Diagnosis Date Noted  . Obesity 09/21/2014  . Migraine without aura 05/25/2014  . Migraine   . Vitamin D deficiency   . Beta thalassemia (HCC)   . LFT elevation   . Positive ANA (antinuclear antibody)     Past Surgical History:  Procedure Laterality Date  . WISDOM TOOTH EXTRACTION      OB History    Gravida Para Term Preterm AB Living   2 2 2     2    SAB TAB Ectopic Multiple Live Births                   Home Medications    Prior to Admission medications   Medication Sig Start Date End Date Taking? Authorizing Provider  cyclobenzaprine (FLEXERIL) 5 MG tablet Take 1 tablet (5 mg total) by mouth at bedtime as needed for muscle spasms. Patient taking differently: Take 5 mg by mouth at bedtime as needed for muscle spasms.  01/18/15   Rodolph Bongorey, Evan S, MD  diclofenac (CATAFLAM) 50 MG tablet Take  1 tablet (50 mg total) by mouth 3 (three) times daily. Prn chest pain Patient taking differently: Take 50 mg by mouth 3 (three) times daily as needed (chest pain).  03/25/15   Linna HoffKindl, James D, MD  HYDROcodone-acetaminophen (NORCO/VICODIN) 5-325 MG per tablet Take 1 tablet by mouth every 6 (six) hours as needed. 03/26/15   Derwood KaplanNanavati, Ankit, MD  ibuprofen (ADVIL,MOTRIN) 600 MG tablet Take 1 tablet (600 mg total) by mouth every 6 (six) hours as needed. 03/31/16   Fayrene Helperran, Bowie, PA-C  methocarbamol (ROBAXIN) 500 MG tablet Take 1 tablet (500 mg total) by mouth 3 times/day as needed-between meals & bedtime for muscle spasms. 04/23/17   Earley FavorSchulz, Hillery Zachman, NP    Family History Family History  Problem Relation Age of Onset  . Thalassemia Mother   . Thalassemia Brother   . Cancer Maternal Grandmother        cervical cancer    Social History Social History  Substance Use Topics  . Smoking status: Never Smoker  . Smokeless tobacco: Never Used  . Alcohol use No     Allergies   Percocet [oxycodone-acetaminophen] and Topamax [topiramate]   Review of Systems Review  of Systems  Constitutional: Negative for fever.  Respiratory: Negative for shortness of breath.   Cardiovascular: Negative for leg swelling.  Musculoskeletal: Negative for joint swelling.  Skin: Negative for wound.  Neurological: Negative for weakness and numbness.  All other systems reviewed and are negative.    Physical Exam Updated Vital Signs BP (!) 135/107 (BP Location: Right Arm)   Pulse 82   Temp 98.2 F (36.8 C) (Oral)   Resp 16   Ht 5\' 2"  (1.575 m)   Wt 79.4 kg (175 lb)   LMP 04/23/2017 (Exact Date)   SpO2 100%   BMI 32.01 kg/m   Physical Exam  Constitutional: She appears well-developed and well-nourished.  Eyes: Pupils are equal, round, and reactive to light.  Neck: Normal range of motion.  Cardiovascular: Normal rate.   Pulmonary/Chest: Effort normal.  Abdominal: Soft.  Musculoskeletal: Normal range of motion.  She exhibits no edema, tenderness or deformity.  Neurological: She is alert.  Skin: Skin is warm and dry. No rash noted.  Psychiatric: She has a normal mood and affect.  Nursing note and vitals reviewed.    ED Treatments / Results  Labs (all labs ordered are listed, but only abnormal results are displayed) Labs Reviewed  CBC WITH DIFFERENTIAL/PLATELET - Abnormal; Notable for the following:       Result Value   RBC 5.15 (*)    Hemoglobin 11.2 (*)    HCT 35.6 (*)    MCV 69.1 (*)    MCH 21.7 (*)    All other components within normal limits  BASIC METABOLIC PANEL - Abnormal; Notable for the following:    Potassium 3.3 (*)    All other components within normal limits    EKG  EKG Interpretation None       Radiology No results found.  Procedures Procedures (including critical care time)  Medications Ordered in ED Medications - No data to display   Initial Impression / Assessment and Plan / ED Course  I have reviewed the triage vital signs and the nursing notes.  Pertinent labs & imaging results that were available during my care of the patient were reviewed by me and considered in my medical decision making (see chart for details).    Patient's labs have been checked.  No sign of kidney disease or heart disease.  Vital signs are within normal parameters.  Examination of the leg shows normal anatomy cannot reproduce cramping at this time. She has been instructed to to drink tonic water 2-3 times a day to do specific exercises before bed and in the morning. She'll be given a referral to neurology for further evaluation    Final Clinical Impressions(s) / ED Diagnoses   Final diagnoses:  Leg cramping    New Prescriptions Current Discharge Medication List       Earley Favor, NP 04/23/17 2049    Tegeler, Canary Brim, MD 04/24/17 [phone]

## 2017-04-23 NOTE — ED Triage Notes (Signed)
Pt presents with 2 month h/o cramping to R leg.  Pt reports cramping begins in her foot, moves into thigh.  Pt reports x 3-4 days, cramping lasts longer (2.5 hours); reports intermittent swelling to leg.  Pt also reports 4 day h/o nasal drainage, sneezing; denies cough.

## 2017-04-23 NOTE — Discharge Instructions (Addendum)
To reduce your risk of getting leg cramps in the future, you should do exercises to stretch the affected muscles three times a day.  For example, if your calf muscles are affected by cramps, the following exercise should be beneficial:  stand about a metre away from a wall lean forward with your arms outstretched to touch the wall while keeping the soles of your feet flat on the floor hold this position for five seconds before releasing repeat the exercise for five minutes For the best results, you should repeat this exercise three times a day, including one session just before you go to bed.  If you find these exercises useful you can carry on doing them for as long as you are able to.  Also recommend that you drink 4-5 bottles of tonic water a day

## 2017-08-19 ENCOUNTER — Encounter (HOSPITAL_COMMUNITY): Payer: Self-pay | Admitting: Emergency Medicine

## 2017-08-19 DIAGNOSIS — G43009 Migraine without aura, not intractable, without status migrainosus: Secondary | ICD-10-CM | POA: Insufficient documentation

## 2017-08-19 DIAGNOSIS — Z79899 Other long term (current) drug therapy: Secondary | ICD-10-CM | POA: Insufficient documentation

## 2017-08-19 NOTE — ED Triage Notes (Signed)
Pt to [facility] requesting update, informed her of longest wait time 

## 2017-08-19 NOTE — ED Triage Notes (Signed)
Pt arrives via [facility] from [facility] with migraine off and on for the 2 weeks. Pt reports nausea, lightheaded, dizziness with the headaches. Pt reports left eye blurry with the headache. Pt awake, alert, oriented x4, VSS.

## 2017-08-20 ENCOUNTER — Emergency Department (HOSPITAL_COMMUNITY)
Admission: EM | Admit: 2017-08-20 | Discharge: 2017-08-20 | Disposition: A | Discharge status: Home / Self Care | Payer: BLUE CROSS/BLUE SHIELD | Attending: Emergency Medicine | Admitting: Emergency Medicine

## 2017-08-20 DIAGNOSIS — G43009 Migraine without aura, not intractable, without status migrainosus: Secondary | ICD-10-CM

## 2017-08-20 LAB — I-STAT BETA HCG BLOOD, ED (MC, WL, AP ONLY)

## 2017-08-20 MED ORDER — DIPHENHYDRAMINE HCL 50 MG/ML IJ SOLN
25.0000 mg | Freq: Once | INTRAMUSCULAR | Status: AC
  Administered 2017-08-20: 25 mg via INTRAVENOUS
  Filled 2017-08-20: qty 1

## 2017-08-20 MED ORDER — BUTALBITAL-APAP-CAFFEINE 50-325-40 MG PO TABS
1.0000 | ORAL_TABLET | Freq: Four times a day (QID) | ORAL | 0 refills | Status: DC | PRN
Start: 2017-08-20 — End: 2018-05-28

## 2017-08-20 MED ORDER — KETOROLAC TROMETHAMINE 30 MG/ML IJ SOLN
30.0000 mg | Freq: Once | INTRAMUSCULAR | Status: AC
  Administered 2017-08-20: 30 mg via INTRAVENOUS
  Filled 2017-08-20: qty 1

## 2017-08-20 MED ORDER — METOCLOPRAMIDE HCL 5 MG/ML IJ SOLN
10.0000 mg | Freq: Once | INTRAMUSCULAR | Status: AC
  Administered 2017-08-20: 10 mg via INTRAVENOUS
  Filled 2017-08-20: qty 2

## 2017-08-20 MED ORDER — SODIUM CHLORIDE 0.9 % IV BOLUS (SEPSIS)
1000.0000 mL | Freq: Once | INTRAVENOUS | Status: AC
  Administered 2017-08-20: 1000 mL via INTRAVENOUS

## 2017-08-20 MED ORDER — ONDANSETRON 4 MG PO TBDP: 4.0000 mg | ORAL_TABLET | Freq: Three times a day (TID) | ORAL | 0 refills | Status: DC | PRN

## 2017-08-20 MED ORDER — DEXAMETHASONE SODIUM PHOSPHATE 10 MG/ML IJ SOLN
10.0000 mg | Freq: Once | INTRAMUSCULAR | Status: AC
  Administered 2017-08-20: 10 mg via INTRAVENOUS
  Filled 2017-08-20: qty 1

## 2017-08-20 NOTE — ED Notes (Signed)
Pt verbalized understanding of d/c instructions and has no further questions. Pt is stable, A&Ox4, VSS.  

## 2017-08-20 NOTE — Discharge Instructions (Signed)
To find a primary care or specialty doctor please call [phone] or [phone] to access "[facility] Find a Doctor Service." ° °You may also go on the [facility] website at [email or web address] ° °There are also multiple [facility], [facility] and [facility] practices throughout the [place] that are frequently accepting new patients. You may find a clinic that is close to your home and contact them. ° °[facility] -  °[address] °[place] [address] °[phone] ° ° °[facility] -  °[address] °[place] [address] [phone] ° ° °[facility] - °[address]  °[place] [address] °[phone] ° ° °

## 2017-08-20 NOTE — ED Notes (Signed)
ED Provider at bedside. 

## 2017-08-20 NOTE — ED Provider Notes (Signed)
TIME SEEN: 1:19 AM  CHIEF COMPLAINT: Migraine  HPI: Patient is a 31 year old female with history of migraine headaches who presents to the emergency department with her typical migraine headache. Has had intermittent headaches for the past 2 weeks. Pain is left-sided, sharp and similar to her prior migraines. Has had associated dizziness, nausea and vomiting and blurry vision in her left eye. No loss of vision. No unilateral numbness, tingling or focal weakness. No fever. No head injury. Not on antiplatelets or anticoagulants. She does not wear glasses or contacts.  ROS: See HPI Constitutional: no fever  Eyes: no drainage  ENT: no runny nose   Cardiovascular:  no chest pain  Resp: no SOB  GI: no vomiting GU: no dysuria Integumentary: no rash  Allergy: no hives  Musculoskeletal: no leg swelling  Neurological: no slurred speech ROS otherwise negative  PAST MEDICAL HISTORY/PAST SURGICAL HISTORY:  Past Medical History:  Diagnosis Date  . 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 LFT elevation    unclear etiology; negative work up in 08/2011.   . Migraine   . Positive ANA (antinuclear antibody)    resolved when she was evaluated by Rheumatology  . Vitamin D deficiency     MEDICATIONS:  Prior to Admission medications   Medication Sig Start Date End Date Taking? Authorizing Provider  acetaminophen (TYLENOL) 325 MG tablet Take 650 mg by mouth every 6 (six) hours as needed for headache.   Yes [provider]  ibuprofen (ADVIL,MOTRIN) 200 MG tablet Take 200 mg by mouth every 6 (six) hours as needed for headache.   Yes [provider]  cyclobenzaprine (FLEXERIL) 5 MG tablet Take 1 tablet (5 mg total) by mouth at bedtime as needed for muscle spasms. Patient not taking: Reported on 08/20/2017 01/18/15   Rodolph Bong, MD  diclofenac (CATAFLAM) 50 MG tablet Take 1 tablet (50 mg total) by mouth 3 (three) times daily. Prn chest  pain Patient not taking: Reported on 08/20/2017 03/25/15   Linna Hoff, MD  HYDROcodone-acetaminophen (NORCO/VICODIN) 5-325 MG per tablet Take 1 tablet by mouth every 6 (six) hours as needed. Patient not taking: Reported on 08/20/2017 03/26/15   Derwood Kaplan, MD  ibuprofen (ADVIL,MOTRIN) 600 MG tablet Take 1 tablet (600 mg total) by mouth every 6 (six) hours as needed. Patient not taking: Reported on 08/20/2017 03/31/16   Fayrene Helper, PA-C  methocarbamol (ROBAXIN) 500 MG tablet Take 1 tablet (500 mg total) by mouth 3 times/day as needed-between meals & bedtime for muscle spasms. Patient not taking: Reported on 08/20/2017 04/23/17   Earley Favor, NP    ALLERGIES:  Allergies  Allergen Reactions  . Percocet [Oxycodone-Acetaminophen] Rash  . Topamax [Topiramate] Rash and Other (See Comments)    Blood in stool    SOCIAL HISTORY:  Social History  Substance Use Topics  . Smoking status: Never Smoker  . Smokeless tobacco: Never Used  . Alcohol use No    FAMILY HISTORY: Family History  Problem Relation Age of Onset  . Thalassemia Mother   . Thalassemia Brother   . Cancer Maternal Grandmother        cervical cancer    EXAM: BP 116/60 (BP Location: Left Arm)   Pulse 72   Temp 98.2 F (36.8 C) (Oral)   Resp 16   Ht  (1.549 m)   Wt 81.6 kg (180 lb)   LMP 08/18/2017   SpO2 100%   BMI 34.01  kg/m  CONSTITUTIONAL: Alert and oriented and responds appropriately to questions. Well-appearing; well-nourished HEAD: Normocephalic EYES: Conjunctivae clear, pupils appear equal, EOMI ENT: normal nose; moist mucous membranes NECK: Supple, no meningismus, no nuchal rigidity, no LAD  CARD: RRR; S1 and S2 appreciated; no murmurs, no clicks, no rubs, no gallops RESP: Normal chest excursion without splinting or tachypnea; breath sounds clear and equal bilaterally; no wheezes, no rhonchi, no rales, no hypoxia or respiratory distress, speaking full sentences ABD/GI: Normal bowel sounds;  non-distended; soft, non-tender, no rebound, no guarding, no peritoneal signs, no hepatosplenomegaly BACK:  The back appears normal and is non-tender to palpation, there is no CVA tenderness EXT: Normal ROM in all joints; non-tender to palpation; no edema; normal capillary refill; no cyanosis, no calf tenderness or swelling    SKIN: Normal color for age and race; warm; no rash NEURO: Moves all extremities equally, Strength 5/5 in all 4 extremities, sensation to light touch intact diffusely, cranial nerves II through XII intact, normal speech PSYCH: The patient's mood and manner are appropriate. Grooming and personal hygiene are appropriate.  MEDICAL DECISION MAKING: Patient here with typical migraine headache. No focal neurologic deficit.  No head injury. Not on blood thinners. No fever or meningismus. I do not feel she needs emergent head imaging. We'll treat with migraine cocktail and reassess.  ED PROGRESS: 3:00 AM  Pt's pregnancy test is negative. Reports her headache is on is completely gone after migraine cocktail of Toradol, Reglan, Benadryl, IV fluids and Decadron. Will get outpatient neurology follow-up given she reports frequent migraine headaches. I feel she is safe to be discharged. She states she has someone to drive her home.   At this time, I do not feel there is any life-threatening condition present. I have reviewed and discussed all results (EKG, imaging, lab, urine as appropriate) and exam findings with patient/family. I have reviewed nursing notes and appropriate previous records.  I feel the patient is safe to be discharged home without further emergent workup and can continue workup as an outpatient as needed. Discussed usual and customary return precautions. Patient/family verbalize understanding and are comfortable with this plan.  Outpatient follow-up has been provided if needed. All questions have been answered.      Ward, Layla Maw, DO 08/20/17 0300

## 2017-09-27 ENCOUNTER — Encounter (HOSPITAL_COMMUNITY): Payer: Self-pay

## 2017-09-27 ENCOUNTER — Emergency Department (HOSPITAL_COMMUNITY): Payer: BLUE CROSS/BLUE SHIELD

## 2017-09-27 ENCOUNTER — Emergency Department (HOSPITAL_COMMUNITY)
Admission: EM | Admit: 2017-09-27 | Discharge: 2017-09-27 | Disposition: A | Discharge status: Home / Self Care | Payer: BLUE CROSS/BLUE SHIELD | Attending: Emergency Medicine | Admitting: Emergency Medicine

## 2017-09-27 DIAGNOSIS — B9689 Other specified bacterial agents as the cause of diseases classified elsewhere: Secondary | ICD-10-CM

## 2017-09-27 DIAGNOSIS — R1030 Lower abdominal pain, unspecified: Secondary | ICD-10-CM | POA: Diagnosis present

## 2017-09-27 DIAGNOSIS — N760 Acute vaginitis: Secondary | ICD-10-CM | POA: Insufficient documentation

## 2017-09-27 DIAGNOSIS — Z79899 Other long term (current) drug therapy: Secondary | ICD-10-CM | POA: Insufficient documentation

## 2017-09-27 LAB — CBC WITH DIFFERENTIAL/PLATELET
BASOS PCT: 0 %
Basophils Absolute: 0 10*3/uL (ref 0.0–0.1)
Eosinophils Absolute: 0.1 10*3/uL (ref 0.0–0.7)
Eosinophils Relative: 1 %
HEMATOCRIT: 36.9 % (ref 36.0–46.0)
HEMOGLOBIN: 11.4 g/dL — AB (ref 12.0–15.0)
LYMPHS ABS: 1.8 10*3/uL (ref 0.7–4.0)
LYMPHS PCT: 26 %
MCH: 21.6 pg — AB (ref 26.0–34.0)
MCHC: 30.9 g/dL (ref 30.0–36.0)
MCV: 69.9 fL — ABNORMAL LOW (ref 78.0–100.0)
MONOS PCT: 4 %
Monocytes Absolute: 0.3 10*3/uL (ref 0.1–1.0)
NEUTROS ABS: 4.7 10*3/uL (ref 1.7–7.7)
NEUTROS PCT: 69 %
Platelets: 346 10*3/uL (ref 150–400)
RBC: 5.28 MIL/uL — ABNORMAL HIGH (ref 3.87–5.11)
RDW: 14.7 % (ref 11.5–15.5)
WBC: 6.8 10*3/uL (ref 4.0–10.5)

## 2017-09-27 LAB — COMPREHENSIVE METABOLIC PANEL
ALBUMIN: 4.6 g/dL (ref 3.5–5.0)
ALK PHOS: 49 U/L (ref 38–126)
ALT: 18 U/L (ref 14–54)
ANION GAP: 7 (ref 5–15)
AST: 18 U/L (ref 15–41)
BILIRUBIN TOTAL: 0.4 mg/dL (ref 0.3–1.2)
BUN: 12 mg/dL (ref 6–20)
CALCIUM: 9.2 mg/dL (ref 8.9–10.3)
CO2: 25 mmol/L (ref 22–32)
Chloride: 106 mmol/L (ref 101–111)
Creatinine, Ser: 0.82 mg/dL (ref 0.44–1.00)
GFR calc Af Amer: >60 mL/min (ref >60)
GFR calc non Af Amer: >60 mL/min (ref >60)
GLUCOSE: 107 mg/dL — AB (ref 65–99)
Potassium: 3.4 mmol/L — ABNORMAL LOW (ref 3.5–5.1)
Sodium: 138 mmol/L (ref 135–145)
TOTAL PROTEIN: 7.9 g/dL (ref 6.5–8.1)

## 2017-09-27 LAB — WET PREP, GENITAL
Sperm: NONE SEEN
Trich, Wet Prep: NONE SEEN
Yeast Wet Prep HPF POC: NONE SEEN

## 2017-09-27 LAB — URINALYSIS, ROUTINE W REFLEX MICROSCOPIC
Bilirubin Urine: NEGATIVE
Glucose, UA: NEGATIVE mg/dL
Hgb urine dipstick: NEGATIVE
Ketones, ur: NEGATIVE mg/dL
Nitrite: NEGATIVE
Protein, ur: NEGATIVE mg/dL
Specific Gravity, Urine: 1.026 (ref 1.005–1.030)
pH: 6 (ref 5.0–8.0)

## 2017-09-27 LAB — PREGNANCY, URINE: Preg Test, Ur: NEGATIVE

## 2017-09-27 MED ORDER — MORPHINE SULFATE (PF) 4 MG/ML IV SOLN
4.0000 mg | Freq: Once | INTRAVENOUS | Status: AC
  Administered 2017-09-27: 4 mg via INTRAVENOUS
  Filled 2017-09-27: qty 1

## 2017-09-27 MED ORDER — IOPAMIDOL (ISOVUE-300) INJECTION 61%
100.0000 mL | Freq: Once | INTRAVENOUS | Status: AC | PRN
  Administered 2017-09-27: 100 mL via INTRAVENOUS

## 2017-09-27 MED ORDER — IBUPROFEN 800 MG PO TABS
800.0000 mg | ORAL_TABLET | Freq: Once | ORAL | Status: AC
  Administered 2017-09-27: 800 mg via ORAL
  Filled 2017-09-27: qty 1

## 2017-09-27 MED ORDER — METRONIDAZOLE 500 MG PO TABS: 500.0000 mg | ORAL_TABLET | Freq: Two times a day (BID) | ORAL | 0 refills | Status: DC

## 2017-09-27 MED ORDER — HYDROCODONE-ACETAMINOPHEN 5-325 MG PO TABS: 1.0000 | ORAL_TABLET | ORAL | 0 refills | Status: DC | PRN

## 2017-09-27 MED ORDER — ONDANSETRON HCL 4 MG/2ML IJ SOLN
4.0000 mg | Freq: Once | INTRAMUSCULAR | Status: AC
  Administered 2017-09-27: 4 mg via INTRAVENOUS
  Filled 2017-09-27: qty 2

## 2017-09-27 NOTE — ED Provider Notes (Signed)
[facility] EMERGENCY DEPARTMENT Provider Note   CSN: [id number] Arrival date & time: 09/27/17  1539     History   Chief Complaint Chief Complaint  Patient presents with  . Abdominal Pain    HPI Jill Hobbs is a 31 y.o. female with past medical history as outlined below presenting with a one week history of low back pain which is worsened with movement and similar to her prior low back pain episodes and worsened with movement.  Then yesterday, she developed lower bilateral cramping pelvic pain as well. She just completed her menses and is concerned for a possible retained tampon as she last placed one 5 days ago and is not sure it has come out.  She denies fevers, chills, n/v diarrhea, constipation, vaginal discharge.  She has had no treatments prior to arrival.  The history is provided by the patient.    Past Medical History:  Diagnosis Date  . 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 LFT elevation    unclear etiology; negative work up in 08/2011.   . Migraine   . Positive ANA (antinuclear antibody)    resolved when she was evaluated by Rheumatology  . Vitamin D deficiency     Patient Active Problem List   Diagnosis Date Noted  . Obesity 09/21/2014  . Migraine without aura 05/25/2014  . Migraine   . Vitamin D deficiency   . Beta thalassemia (HCC)   . LFT elevation   . Positive ANA (antinuclear antibody)     Past Surgical History:  Procedure Laterality Date  . WISDOM TOOTH EXTRACTION      OB History    Gravida Para Term Preterm AB Living   2 2 2     2    SAB TAB Ectopic Multiple Live Births                   Home Medications    Prior to Admission medications   Medication Sig Start Date End Date Taking? Authorizing Provider  amitriptyline (ELAVIL) 25 MG tablet Take 25 mg at bedtime by mouth.   Yes [provider]  ibuprofen (ADVIL,MOTRIN) 200 MG tablet Take 200 mg by mouth every 6 (six) hours  as needed for headache.   Yes [provider]  butalbital-acetaminophen-caffeine (FIORICET, ESGIC) 50-325-40 MG tablet Take 1-2 tablets by mouth every 6 (six) hours as needed for headache. Patient not taking: Reported on 09/27/2017 08/20/17 08/20/18  Ward, Layla Maw, DO  cyclobenzaprine (FLEXERIL) 5 MG tablet Take 1 tablet (5 mg total) by mouth at bedtime as needed for muscle spasms. Patient not taking: Reported on 08/20/2017 01/18/15   Rodolph Bong, MD  diclofenac (CATAFLAM) 50 MG tablet Take 1 tablet (50 mg total) by mouth 3 (three) times daily. Prn chest pain Patient not taking: Reported on 08/20/2017 03/25/15   Linna Hoff, MD  HYDROcodone-acetaminophen (NORCO/VICODIN) 5-325 MG tablet Take 1 tablet every 4 (four) hours as needed by mouth. 09/27/17   Kylan Liberati, Raynelle Fanning, PA-C  ibuprofen (ADVIL,MOTRIN) 600 MG tablet Take 1 tablet (600 mg total) by mouth every 6 (six) hours as needed. Patient not taking: Reported on 08/20/2017 03/31/16   Fayrene Helper, PA-C  methocarbamol (ROBAXIN) 500 MG tablet Take 1 tablet (500 mg total) by mouth 3 times/day as needed-between meals & bedtime for muscle spasms. Patient not taking: Reported on 08/20/2017 04/23/17   Earley Favor, NP  metroNIDAZOLE (FLAGYL) 500 MG tablet Take 1 tablet (  500 mg total) 2 (two) times daily by mouth. 09/27/17   Rakeb Kibble, Raynelle FanningJulie, PA-C  ondansetron (ZOFRAN ODT) 4 MG disintegrating tablet Take 1 tablet (4 mg total) by mouth every 8 (eight) hours as needed for nausea or vomiting. Patient not taking: Reported on 09/27/2017 08/20/17   Ward, Layla MawKristen N, DO    Family History Family History  Problem Relation Age of Onset  . Thalassemia Mother   . Thalassemia Brother   . Cancer Maternal Grandmother        cervical cancer    Social History Social History   Tobacco Use  . Smoking status: Never Smoker  . Smokeless tobacco: Never Used  Substance Use Topics  . Alcohol use: No  . Drug use: No     Allergies   Percocet [oxycodone-acetaminophen] and  Topamax [topiramate]   Review of Systems Review of Systems  Constitutional: Negative for fever.  HENT: Negative for congestion and sore throat.   Eyes: Negative.   Respiratory: Negative for chest tightness and shortness of breath.   Cardiovascular: Negative for chest pain.  Gastrointestinal: Positive for abdominal pain. Negative for constipation, diarrhea, nausea and vomiting.  Genitourinary: Negative.   Musculoskeletal: Negative for arthralgias, joint swelling and neck pain.  Skin: Negative.  Negative for rash and wound.  Neurological: Negative for dizziness, weakness, light-headedness, numbness and headaches.  Psychiatric/Behavioral: Negative.      Physical Exam Updated Vital Signs BP 109/70 (BP Location: Right Arm)   Pulse 83   Temp 98.7 F (37.1 C) (Oral)   Resp 20   Ht 5\' 1"  (1.549 m)   Wt 81.6 kg (180 lb)   LMP 09/18/2017 (Exact Date)   SpO2 96%   BMI 34.01 kg/m   Physical Exam  Constitutional: She appears well-developed and well-nourished.  HENT:  Head: Normocephalic and atraumatic.  Eyes: Conjunctivae are normal.  Neck: Normal range of motion.  Cardiovascular: Normal rate, regular rhythm, normal heart sounds and intact distal pulses.  Pulmonary/Chest: Effort normal and breath sounds normal. She has no wheezes.  Abdominal: Soft. Bowel sounds are normal. There is tenderness in the right lower quadrant and suprapubic area. There is no rigidity, no rebound, no guarding and no CVA tenderness.  Genitourinary: Uterus normal. Cervix exhibits no motion tenderness, no discharge and no friability. Right adnexum displays tenderness. No vaginal discharge found.  Genitourinary Comments: Mild tenderness right adnexa and midline.  Musculoskeletal: Normal range of motion.  Neurological: She is alert.  Skin: Skin is warm and dry.  Psychiatric: She has a normal mood and affect.  Nursing note and vitals reviewed.    ED Treatments / Results  Labs (all labs ordered are listed,  but only abnormal results are displayed) Labs Reviewed  WET PREP, GENITAL - Abnormal; Notable for the following components:      Result Value   Clue Cells Wet Prep HPF POC PRESENT (*)    WBC, Wet Prep HPF POC MANY (*)    All other components within normal limits  URINALYSIS, ROUTINE W REFLEX MICROSCOPIC - Abnormal; Notable for the following components:   APPearance HAZY (*)    Leukocytes, UA SMALL (*)    Bacteria, UA RARE (*)    Squamous Epithelial / LPF 6-30 (*)    All other components within normal limits  CBC WITH DIFFERENTIAL/PLATELET - Abnormal; Notable for the following components:   RBC 5.28 (*)    Hemoglobin 11.4 (*)    MCV 69.9 (*)    MCH 21.6 (*)    All  other components within normal limits  COMPREHENSIVE METABOLIC PANEL - Abnormal; Notable for the following components:   Potassium 3.4 (*)    Glucose, Bld 107 (*)    All other components within normal limits  URINE CULTURE  PREGNANCY, URINE  GC/CHLAMYDIA PROBE AMP ([place]) NOT AT [facility]    EKG  EKG Interpretation None       Radiology Ct Abdomen Pelvis W Contrast  Result Date: 09/27/2017 CLINICAL DATA:  Lower abdominal pain today. Last bowel movement yesterday. Chronic low back pain. EXAM: CT ABDOMEN AND PELVIS WITH CONTRAST TECHNIQUE: Multidetector CT imaging of the abdomen and pelvis was performed using the standard protocol following bolus administration of intravenous contrast. CONTRAST:  ISOVUE-300 IOPAMIDOL (ISOVUE-300) INJECTION 61% COMPARISON:  Lumbar spine radiograph January 03, 2014 FINDINGS: LOWER CHEST: Lung bases are clear. Included heart size is normal. No pericardial effusion. HEPATOBILIARY: Liver and gallbladder are normal. PANCREAS: Normal. SPLEEN: Normal. ADRENALS/URINARY TRACT: Kidneys are orthotopic, demonstrating symmetric enhancement. No nephrolithiasis, hydronephrosis or solid renal masses. The unopacified ureters are normal in course and caliber. Urinary bladder is well distended and  unremarkable. Normal retrocecal adrenal glands. STOMACH/BOWEL: The stomach, small and large bowel are normal in course and caliber without inflammatory changes. Normal appendix. VASCULAR/LYMPHATIC: Aortoiliac vessels are normal in course and caliber. No lymphadenopathy by CT size criteria. REPRODUCTIVE: IUD in central uterus. OTHER: Small amount of free fluid in the pelvis is likely physiologic. No intraperitoneal free air. No focal fluid collections. MUSCULOSKELETAL: Nonacute. 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 Moderate to severe RIGHT L5-S1 neural foraminal narrowing. IMPRESSION: 1. No acute intra-abdominal or pelvic process. 2. 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 Moderate to severe RIGHT L5-S1 neural foraminal narrowing. Electronically Signed   By: Awilda Metro M.D.   On: 09/27/2017 22:03    Procedures Procedures (including critical care time)  Medications Ordered in ED Medications  ibuprofen (ADVIL,MOTRIN) tablet 800 mg (800 mg Oral Given 09/27/17 1950)  morphine 4 MG/ML injection 4 mg (4 mg Intravenous Given 09/27/17 2115)  ondansetron (ZOFRAN) injection 4 mg (4 mg Intravenous Given 09/27/17 2115)  iopamidol (ISOVUE-300) 61 % injection 100 mL (100 mLs Intravenous Contrast Given 09/27/17 2133)     Initial Impression / Assessment and Plan / ED Course  I have reviewed the triage vital signs and the nursing notes.  Pertinent labs & imaging results that were available during my care of the patient were reviewed by me and considered in my medical decision making (see chart for details).     Pt has lower abdominal pain of unclear etiology.  CT abdomen pelvis unremarkable, no appendicitis.  She does not have exam findings to suggest cervicitis or ovarian torsion.  She does have bacterial vaginosis which she states she has had in the past.  White blood cell count is normal in her blood work, however elevated  on her wet prep.  She denies risk factors for STDs.  She will be treated for bacterial vaginosis.  Encouraged close follow-up for persistent or worsening symptoms.  Final Clinical Impressions(s) / ED Diagnoses   Final diagnoses:  Lower abdominal pain  Bacterial vaginosis    ED Discharge Orders        Ordered    metroNIDAZOLE (FLAGYL) 500 MG tablet  2 times daily     09/27/17 2238    HYDROcodone-acetaminophen (NORCO/VICODIN) 5-325 MG tablet  Every 4 hours PRN     09/27/17 2238       Burgess Amor, PA-C  09/27/17 2305    Eber HongMiller, Brian, MD 09/28/17 [phone]

## 2017-09-27 NOTE — ED Triage Notes (Signed)
Pt reports has chronic left lower back pain but started having lower abd pain today.  Denies n/v/d.  LBM was yesterday and was normal per pt.  Denies any dysuria, vaginal bleeding, or discharge.  LMP last Wednesday.

## 2017-09-27 NOTE — Discharge Instructions (Signed)
Take the entire course of flagyl for your bv.  As discussed however, I do not believe this is the source of your pain.  You may take the hydrocodone prescribed for pain relief.  This will make you drowsy - do not drive within 4 hours of taking this medication. Use sparingly so you will recognize if your symptoms are worsening in any way.  Get rechecked if you have worse or persistent symptoms.

## 2017-09-29 LAB — URINE CULTURE: Special Requests: NORMAL

## 2017-09-30 LAB — GC/CHLAMYDIA PROBE AMP (~~LOC~~) NOT AT ARMC
Chlamydia: NEGATIVE
NEISSERIA GONORRHEA: NEGATIVE

## 2017-10-01 ENCOUNTER — Other Ambulatory Visit: Payer: Self-pay

## 2017-10-01 ENCOUNTER — Ambulatory Visit: Payer: BLUE CROSS/BLUE SHIELD | Admitting: Medical

## 2017-10-01 ENCOUNTER — Encounter: Payer: Self-pay | Admitting: Medical

## 2017-10-01 ENCOUNTER — Telehealth: Payer: Self-pay | Admitting: Medical

## 2017-10-01 VITALS — BP 124/80 | HR 88 | Ht 62.5 in | Wt 182.2 lb

## 2017-10-01 DIAGNOSIS — M5440 Lumbago with sciatica, unspecified side: Secondary | ICD-10-CM | POA: Diagnosis not present

## 2017-10-01 DIAGNOSIS — G8929 Other chronic pain: Secondary | ICD-10-CM | POA: Diagnosis not present

## 2017-10-01 DIAGNOSIS — R1013 Epigastric pain: Secondary | ICD-10-CM

## 2017-10-01 MED ORDER — OMEPRAZOLE 40 MG PO CPDR: 40.0000 mg | DELAYED_RELEASE_CAPSULE | Freq: Every day | ORAL | 1 refills | Status: DC

## 2017-10-01 MED ORDER — METHOCARBAMOL 500 MG PO TABS: 500.0000 mg | ORAL_TABLET | Freq: Two times a day (BID) | ORAL | 0 refills | Status: DC | PRN

## 2017-10-01 NOTE — Progress Notes (Signed)
Subjective: Chief Complaint  Patient presents with  . New Patient (Initial Visit)    back and stomach pain off and on for years, but has gotten worse over the last thursday.   Here for ED f/u, new patient.  She works at [employer].  Her NP coworker recommended she come here.    Establish for primary care here today.  No other recent regular doctors.   First started having abdominal pains last week.   When she gets done eating, gets cramping pain, generally 15-20 minutes after eating.   Pain is all over.   Pain can lasts hours.  Denies nausea, vomiting.    Has daily BM.  Has some constipation feeling this past weekend.   No diarrhea.  No fever, no blood in stool.     Periods are regular, not heavy.     Has Mirena, is due to have this removed, is in year 5.     Was seen in the ED for back and stomach pain.  Has hx/o bulging disc.  Has intermittent low back pain.   lately a little worse.  No recent trauma, injury, or fall.   Just started back exercising this week after a period of not exercising.   Saw neurology recently for migraines, Dr. Randel PiggKeen.  Was put on Amitriptyline for prevention, but this doesn't seem to be working.  Had recent MRI brain.  Considering botox injections.    Past Medical History:  Diagnosis Date  . 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 LFT elevation    unclear etiology; negative work up in 08/2011.   . Migraine   . Positive ANA (antinuclear antibody)    resolved when she was evaluated by Rheumatology  . Vitamin D deficiency    Current Outpatient Medications on File Prior to Visit  Medication Sig Dispense Refill  . butalbital-acetaminophen-caffeine (FIORICET, ESGIC) 50-325-40 MG tablet Take 1-2 tablets by mouth every 6 (six) hours as needed for headache. 20 tablet 0  . HYDROcodone-acetaminophen (NORCO/VICODIN) 5-325 MG tablet Take 1 tablet every 4 (four) hours as needed by mouth. 20  tablet 0  . amitriptyline (ELAVIL) 25 MG tablet Take 25 mg at bedtime by mouth.    . [DISCONTINUED] promethazine (PHENERGAN) 25 MG tablet Take 1 tablet (25 mg total) by mouth every 6 (six) hours as needed for nausea or vomiting. (Patient not taking: Reported on 03/26/2015) 30 tablet 1   No current facility-administered medications on file prior to visit.    ROS as in subjective   Objective BP 124/80   Pulse 88   Ht 5' 2.5" (1.588 m)   Wt 182 lb 3.2 oz (82.6 kg)   LMP 09/18/2017 (Exact Date)   SpO2 98%   BMI 32.79 kg/m   General appearance: alert, no distress, WD/WN, obese AA female Oral cavity: MMM, no lesions Neck: supple, no lymphadenopathy, no thyromegaly, no masses Heart: RRR, normal S1, S2, no murmurs Lungs: CTA bilaterally, no wheezes, rhonchi, or rales Abdomen: +bs, soft, epigastric tenderness, otherwise non tender, non distended, no masses, no hepatomegaly, no splenomegaly Pulses: 2+ symmetric, upper and lower extremities, normal cap refill Back non tender, normal ROM, no scoliosis Legs nontender, normal ROM Neuro: normal LE strength, sensation and DTRs    Assessment: Encounter Diagnoses  Name Primary?  . Epigastric abdominal pain Yes  . Chronic bilateral low back pain with sciatica, sciatica laterality unspecified      Plan: Epigastric pain -  discussed the recent ED visit, CT scan abdomen/pelvis, labs.   Avoid GERD triggers, NSAIDs, begin omeprazole,can use Tums prn.   Recheck 2-3 wk.  Back pain - reviewed recent CT of abdomen pelvis showing moderate to severe L5-S1 neural foraminal narrowing.    She can use tylenol or short term norco.   Referral to PT.  discussed importance of regular stretching and back/core stretching exercises.  [name] was seen today for new patient (initial visit).  Diagnoses and all orders for this visit:  Epigastric abdominal pain  Chronic bilateral low back pain with sciatica, sciatica laterality unspecified  Other orders -      omeprazole (PRILOSEC) 40 MG capsule; Take 1 capsule (40 mg total) by mouth daily. -     Discontinue: methocarbamol (ROBAXIN) 500 MG tablet; Take 1 tablet (500 mg total) by mouth 3 times/day as needed-between meals & bedtime for muscle spasms.

## 2017-10-01 NOTE — Telephone Encounter (Signed)
Refer to physical therpay for back pain

## 2017-10-02 NOTE — Telephone Encounter (Signed)
done

## 2017-10-02 NOTE — Addendum Note (Signed)
Addended by: Winn JockVALENTINE, Davey Bergsma N on: 10/02/2017 02:33 PM   Modules accepted: Orders

## 2017-10-21 ENCOUNTER — Ambulatory Visit: Payer: BLUE CROSS/BLUE SHIELD | Admitting: Medical

## 2017-10-28 ENCOUNTER — Other Ambulatory Visit: Payer: Self-pay | Admitting: Medical

## 2017-12-23 ENCOUNTER — Other Ambulatory Visit: Payer: Self-pay

## 2017-12-23 ENCOUNTER — Encounter (HOSPITAL_COMMUNITY): Payer: Self-pay

## 2017-12-23 ENCOUNTER — Emergency Department (HOSPITAL_COMMUNITY)
Admission: EM | Admit: 2017-12-23 | Discharge: 2017-12-23 | Disposition: A | Discharge status: Home / Self Care | Payer: BLUE CROSS/BLUE SHIELD | Attending: Emergency Medicine | Admitting: Emergency Medicine

## 2017-12-23 DIAGNOSIS — Z79899 Other long term (current) drug therapy: Secondary | ICD-10-CM | POA: Diagnosis not present

## 2017-12-23 DIAGNOSIS — M545 Low back pain: Secondary | ICD-10-CM | POA: Diagnosis present

## 2017-12-23 DIAGNOSIS — M5441 Lumbago with sciatica, right side: Secondary | ICD-10-CM | POA: Diagnosis not present

## 2017-12-23 MED ORDER — PREDNISONE 10 MG (21) PO TBPK: ORAL_TABLET | Freq: Every day | ORAL | 0 refills | Status: DC

## 2017-12-23 MED ORDER — ACETAMINOPHEN 500 MG PO TABS: 1000.0000 mg | ORAL_TABLET | Freq: Three times a day (TID) | ORAL | 0 refills | Status: DC

## 2017-12-23 MED ORDER — CYCLOBENZAPRINE HCL 5 MG PO TABS: 5.0000 mg | ORAL_TABLET | Freq: Every evening | ORAL | 0 refills | Status: DC | PRN

## 2017-12-23 NOTE — ED Notes (Signed)
ED Provider at bedside. 

## 2017-12-23 NOTE — Discharge Instructions (Signed)
Take prednisone as prescribed.  Do not take other anti-inflammatories at the same time (Advil, Motrin, naproxen, ibuprofen, Aleve).  Use Tylenol as needed for further pain. Use a muscle relaxer as needed for pain and stiffness. Use heat and ice for pain control. Follow-up with your primary care doctor for further evaluation the end of this week if your pain is not improving. Return to the emergency room if you develop loss of bowel or bladder control, numbness, or any new or worsening symptoms.

## 2017-12-23 NOTE — ED Provider Notes (Signed)
[facility] EMERGENCY DEPARTMENT Provider Note   CSN: [id number] Arrival date & time: 12/23/17  1019     History   Chief Complaint Chief Complaint  Patient presents with  . Back Pain    HPI Jill Hobbs is a 32 y.o. female presenting for evaluation of back pain.  Pt states she started to have low back pain yesterday.  This occurred while she was driving.  She reports gradual worsening of the pain.  Pain is worse on the right side.  It is worse with movement.  Nothing makes it better.  She has tried Tylenol, ibuprofen, and Vicodin.  Nothing improves the pain.  She reports a shooting pain down the back of her right leg.  She reports history of back pain, although never so severe.  She denies fall, trauma, or injury.  She denies fevers, chills, chest pain, shortness of breath, loss of bowel or bladder control, numbness, tingling, history of cancer, history of IV drug use.  She has no other medical problems, does not take medications daily.  HPI  Past Medical History:  Diagnosis Date  . 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 LFT elevation    unclear etiology; negative work up in 08/2011.   . Migraine   . Positive ANA (antinuclear antibody)    resolved when she was evaluated by Rheumatology  . Vitamin D deficiency     Patient Active Problem List   Diagnosis Date Noted  . Obesity 09/21/2014  . Migraine without aura 05/25/2014  . Migraine   . Vitamin D deficiency   . Beta thalassemia (HCC)   . LFT elevation   . Positive ANA (antinuclear antibody)     Past Surgical History:  Procedure Laterality Date  . WISDOM TOOTH EXTRACTION      OB History    Gravida Para Term Preterm AB Living   2 2 2     2    SAB TAB Ectopic Multiple Live Births                   Home Medications    Prior to Admission medications   Medication Sig Start Date End Date Taking? Authorizing Provider  acetaminophen (TYLENOL)  500 MG tablet Take 2 tablets (1,000 mg total) by mouth 3 (three) times daily. 12/23/17   Sheketa Ende, PA-C  amitriptyline (ELAVIL) 25 MG tablet Take 25 mg at bedtime by mouth.    [provider]  butalbital-acetaminophen-caffeine (FIORICET, ESGIC) 50-325-40 MG tablet Take 1-2 tablets by mouth every 6 (six) hours as needed for headache. 08/20/17 08/20/18  Ward, Layla Maw, DO  cyclobenzaprine (FLEXERIL) 5 MG tablet Take 1 tablet (5 mg total) by mouth at bedtime as needed for muscle spasms. 12/23/17   Quame Spratlin, PA-C  HYDROcodone-acetaminophen (NORCO/VICODIN) 5-325 MG tablet Take 1 tablet every 4 (four) hours as needed by mouth. 09/27/17   Idol, Raynelle Fanning, PA-C  methocarbamol (ROBAXIN) 500 MG tablet Take 1 tablet (500 mg total) by mouth 3 times/day as needed-between meals & bedtime for muscle spasms. 10/01/17   Tysinger, Kermit Balo, PA-C  omeprazole (PRILOSEC) 40 MG capsule Take 1 capsule (40 mg total) by mouth daily. 10/28/17   Tysinger, Kermit Balo, PA-C  predniSONE (STERAPRED UNI-PAK 21 TAB) 10 MG (21) TBPK tablet Take by mouth daily. Take 6 tabs by mouth daily  for 2 days, then 5 tabs for 2 days, then 4 tabs for 2 days, then 3 tabs  for 2 days, 2 tabs for 2 days, then 1 tab by mouth daily for 2 days 12/23/17   Generoso Cropper, PA-C    Family History Family History  Problem Relation Age of Onset  . Thalassemia Mother   . Thalassemia Brother   . Cancer Maternal Grandmother        cervical cancer    Social History Social History   Tobacco Use  . Smoking status: Never Smoker  . Smokeless tobacco: Never Used  Substance Use Topics  . Alcohol use: No  . Drug use: No     Allergies   Percocet [oxycodone-acetaminophen] and Topamax [topiramate]   Review of Systems Review of Systems  Musculoskeletal: Positive for back pain.  Skin: Negative for wound.  Neurological: Negative for numbness.     Physical Exam Updated Vital Signs BP 127/80 (BP Location: Right Arm)   Pulse 90    Temp 98.3 F (36.8 C) (Oral)   Resp 16   SpO2 100%   Physical Exam  Constitutional: She is oriented to person, place, and time. She appears well-developed and well-nourished. No distress.  HENT:  Head: Normocephalic and atraumatic.  Eyes: EOM are normal.  Neck: Normal range of motion.  Pulmonary/Chest: Effort normal.  Abdominal: She exhibits no distension.  Musculoskeletal: She exhibits tenderness.  Tenderness to palpation of low back and gluteus.  No tenderness to palpation over midline spine.  Strength lower extremity is intact bilaterally.  Sensation intact bilaterally.  Color and warmth equal bilaterally.  Increased pain with extension of the legs, causing shooting pain down the back of her leg.  Pedal pulses intact bilaterally.  Patient is ambulatory.  Neurological: She is alert and oriented to person, place, and time. She displays normal reflexes. No sensory deficit.  Skin: Skin is warm. No rash noted.  Psychiatric: She has a normal mood and affect.  Nursing note and vitals reviewed.    ED Treatments / Results  Labs (all labs ordered are listed, but only abnormal results are displayed) Labs Reviewed - No data to display  EKG  EKG Interpretation None       Radiology No results found.  Procedures Procedures (including critical care time)  Medications Ordered in ED Medications - No data to display   Initial Impression / Assessment and Plan / ED Course  I have reviewed the triage vital signs and the nursing notes.  Pertinent labs & imaging results that were available during my care of the patient were reviewed by me and considered in my medical decision making (see chart for details).     Patient presenting for evaluation of low back pain for the past day.  Physical exam reassuring, she is neurovascularly intact.  No obvious neurologic deficits.  No red flags for back pain.  Likely sciatica.  Doubt vertebral injury, spinal cord compression, myelopathy, or cauda  equina syndrome.  Will treat with prednisone, Tylenol, muscle relaxers.  Patient to follow-up with primary care as needed.  At this time, patient appears safe for discharge.  Return precautions given.  Patient states she understands and agrees to plan.   Final Clinical Impressions(s) / ED Diagnoses   Final diagnoses:  Acute bilateral low back pain with right-sided sciatica    ED Discharge Orders        Ordered    predniSONE (STERAPRED UNI-PAK 21 TAB) 10 MG (21) TBPK tablet  Daily     12/23/17 1101    cyclobenzaprine (FLEXERIL) 5 MG tablet  At bedtime PRN  12/23/17 1101    acetaminophen (TYLENOL) 500 MG tablet  3 times daily     12/23/17 1101       Alveria ApleyCaccavale, Kamee Bobst, PA-C 12/23/17 1135    Azalia Bilisampos, Kevin, MD 12/23/17 [phone]

## 2017-12-23 NOTE — ED Triage Notes (Signed)
Patient complains of lower back pain x 1 day. Pain radiates down legs, hx of same. denies any new trauma

## 2018-02-10 ENCOUNTER — Ambulatory Visit: Payer: BLUE CROSS/BLUE SHIELD | Admitting: Medical

## 2018-02-13 ENCOUNTER — Ambulatory Visit: Payer: BLUE CROSS/BLUE SHIELD | Admitting: Medical

## 2018-02-13 ENCOUNTER — Encounter: Payer: Self-pay | Admitting: Medical

## 2018-02-13 VITALS — BP 128/82 | HR 93 | Ht 61.0 in | Wt 185.8 lb

## 2018-02-13 DIAGNOSIS — R4586 Emotional lability: Secondary | ICD-10-CM | POA: Diagnosis not present

## 2018-02-13 DIAGNOSIS — R4589 Other symptoms and signs involving emotional state: Secondary | ICD-10-CM

## 2018-02-13 DIAGNOSIS — F329 Major depressive disorder, single episode, unspecified: Secondary | ICD-10-CM

## 2018-02-13 DIAGNOSIS — Z604 Social exclusion and rejection: Secondary | ICD-10-CM

## 2018-02-13 DIAGNOSIS — R4584 Anhedonia: Secondary | ICD-10-CM

## 2018-02-13 MED ORDER — FLUOXETINE HCL 20 MG PO TABS: 20.0000 mg | ORAL_TABLET | Freq: Every day | ORAL | 2 refills | Status: DC

## 2018-02-13 NOTE — Patient Instructions (Signed)
RESOURCES in [place], [place]  If you are experiencing a mental health crisis or an emergency, please call 911 or go to the nearest emergency department.  [facility]   [phone] [facility]  [phone] [facility]   [phone]  Suicide Hotline 1-800-Suicide (1-800-784-2433)  National Suicide Prevention Lifeline 1-800-273-TALK  (1-800-273-8255)  Domestic Violence, Rape/Crisis - [facility] 336-273-7273  The National Domestic Violence Hotline 1-800-799-SAFE (1-800-799-7233)  To report Child or Elder Abuse, please call: [place] [employer]  [phone] [place] [employer]  [phone]  LGBT Youth Crisis Line 1-866-488-7386  Teen Crisis line 336-387-6161 or 1-877-332-7333     Psychiatry and Counseling services  [facility] [address], [place], [place] [address] [phone]  Holly Ingram, therapist Dr. Carey Cottle, psychiatrist Dr. Glenn Jennings, child psychiatrist   Dr. Ramata Strothman Fuller [address], [place], [place] [address] [phone]   Dr. Rupinder Kaur, psychiatry [address], [place], [place] [address] [phone]   [facility] [address], [place], [place] [address] [phone]   [facility] [address], [place], [place] [address] [phone]    Counseling Services (NON- psychiatrist offices)  [facility] [address], [place], [place] [address] [phone]   [facility] [phone] [address], [place], [place] [address]   [facility] [phone]  [email or web address] [address], [place], [place] [address]   Merrianne M. Leff, therapist [phone] [address] [place], [place] [address]   [facility] [phone] [address], [place], [place]  [address]   Jill White-Huffman, therapist [phone] [address], [place],  [address]   [facility] [phone] [address], [place],  [address]   

## 2018-02-13 NOTE — Progress Notes (Signed)
Subjective: Chief Complaint  Patient presents with  . Depression    possible medication   . Anxiety   Here for concern for depression and anxiety.  She has been treated for depression as a child in middle school.  Was on medication for a month or 2.  Wasn't feeling wanted, was having thoughts of suicide at that time.   Was seeing PCP for this then.   As an adult was on Wellbutrin at age 32yo for about 6 months, post partum depression.  Been feeling this way for the past 6 months.   Not feeling her self, less interaction with her kids.   Isolates her self at home.  Forces her self to interact with her kids.  Not coping with substances of abuse.   Has some mood swings.  The doctor she works for advised she get checked for depression.   She mentioned the word manic at times.  Last year she bought a new car when she was feeling this way, or sometimes she works obsessively to cope.   Not seeing a counselor.  No prior diagnosis of bipolar.   No thoughts of SI/HI.  Sleep - awakes a few times throughout the night.  Doesn't go days without sleep.    Hospice was called in a few weeks ago for her grandmother.  She knows this has been difficult for her.   Lives at home with her 2 sons, 11yo and 7yo.  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  Exercise - some.    No mental health issues in her family.   Not taking any vit D although she has hx/o Vit D deficiency.   Mood questionnaire - has spent money on things she doesn't need, buying out of emotion.   Can raise voice at people.  bought a new car last year going through something.      Past Medical History:  Diagnosis Date  . 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 LFT elevation    unclear etiology; negative work up in 08/2011.   . Migraine   . Positive ANA (antinuclear antibody)    resolved when she was evaluated by Rheumatology  . Vitamin D deficiency      ROS as in subjective    Objective: BP 128/82 (BP Location: Right Arm, Patient Position: Sitting, Cuff Size: Normal)   Pulse 93   Ht 5\' 1"  (1.549 m)   Wt 185 lb 12.8 oz (84.3 kg)   LMP 02/12/2018   SpO2 98%   BMI 35.11 kg/m   Gen: wd, wn, nad Psych: pleasant, good eye contact, answers questions appropriately   Assessment: Encounter Diagnoses  Name Primary?  . Mood swing Yes  . Depressed mood   . Anhedonia   . Isolation (social)     Plan: +PHQ9 and +mood questionnaire  advised she begin medication below.   dicussed risks/benefits.  Advised she establish with psychiatry ASAP.   Call if any problems in the meantime.  F/u here in 2 wk, soon prn.   F/u with psychiatry and counseling as well.   [name] was seen today for depression and anxiety.  Diagnoses and all orders for this visit:  Mood swing  Depressed mood  Anhedonia  Isolation (social)  Other orders -     FLUoxetine (PROZAC) 20 MG tablet; Take 1 tablet (20 mg total) by mouth daily.

## 2018-04-30 ENCOUNTER — Encounter: Payer: Self-pay | Admitting: Medical

## 2018-04-30 ENCOUNTER — Telehealth: Payer: Self-pay | Admitting: Medical

## 2018-04-30 NOTE — Telephone Encounter (Signed)
pls send letter reminder.   I am concerned as she was severely depressed last visit

## 2018-04-30 NOTE — Telephone Encounter (Signed)
Please get her in for follow up visit soon.

## 2018-04-30 NOTE — Telephone Encounter (Signed)
Done sent letter

## 2018-04-30 NOTE — Telephone Encounter (Signed)
Called but could not leave a voicemail/voicemail was full

## 2018-05-28 ENCOUNTER — Emergency Department (HOSPITAL_COMMUNITY)
Admission: EM | Admit: 2018-05-28 | Discharge: 2018-05-28 | Disposition: A | Discharge status: Home / Self Care | Payer: BLUE CROSS/BLUE SHIELD | Attending: Emergency Medicine | Admitting: Emergency Medicine

## 2018-05-28 ENCOUNTER — Encounter (HOSPITAL_COMMUNITY): Payer: Self-pay

## 2018-05-28 DIAGNOSIS — Z79899 Other long term (current) drug therapy: Secondary | ICD-10-CM | POA: Diagnosis not present

## 2018-05-28 DIAGNOSIS — G43909 Migraine, unspecified, not intractable, without status migrainosus: Secondary | ICD-10-CM

## 2018-05-28 MED ORDER — SODIUM CHLORIDE 0.9 % IV BOLUS
1000.0000 mL | Freq: Once | INTRAVENOUS | Status: AC
Start: 2018-05-28 — End: 2018-05-28
  Administered 2018-05-28: 1000 mL via INTRAVENOUS

## 2018-05-28 MED ORDER — KETOROLAC TROMETHAMINE 30 MG/ML IJ SOLN
30.0000 mg | Freq: Once | INTRAMUSCULAR | Status: AC
  Administered 2018-05-28: 30 mg via INTRAVENOUS
  Filled 2018-05-28: qty 1

## 2018-05-28 MED ORDER — PROMETHAZINE HCL 25 MG/ML IJ SOLN
25.0000 mg | Freq: Once | INTRAMUSCULAR | Status: AC
  Administered 2018-05-28: 25 mg via INTRAVENOUS
  Filled 2018-05-28: qty 1

## 2018-05-28 NOTE — ED Provider Notes (Signed)
[facility] Provider Note   CSN: [id number] Arrival date & time: 05/28/18  1600     History   Chief Complaint Chief Complaint  Patient presents with  . Migraine    HPI Jill Hobbs is a 32 y.o. female.  HPI Patient with migraine headache for the last 3 days.  Left-sided headache with nausea.  Like typical migraines.  Has been worked up for this in the past.  No relief with her medicines at home.  Some vomiting.  Denies possible pregnancy.  No trauma.  Pain is sharp and dull and on the left side. Past Medical History:  Diagnosis Date  . 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 LFT elevation    unclear etiology; negative work up in 08/2011.   . Migraine   . Positive ANA (antinuclear antibody)    resolved when she was evaluated by Rheumatology  . Vitamin D deficiency     Patient Active Problem List   Diagnosis Date Noted  . Obesity 09/21/2014  . Migraine without aura 05/25/2014  . Migraine   . Vitamin D deficiency   . Beta thalassemia (HCC)   . LFT elevation   . Positive ANA (antinuclear antibody)     Past Surgical History:  Procedure Laterality Date  . WISDOM TOOTH EXTRACTION       OB History    Gravida  2   Para  2   Term  2   Preterm      AB      Living  2     SAB      TAB      Ectopic      Multiple      Live Births               Home Medications    Prior to Admission medications   Medication Sig Start Date End Date Taking? Authorizing Provider  acetaminophen (TYLENOL) 500 MG tablet Take 2 tablets (1,000 mg total) by mouth 3 (three) times daily. Patient taking differently: Take 500-1,000 mg by mouth 3 (three) times daily.  12/23/17  Yes Caccavale, Sophia, PA-C  aspirin-acetaminophen-caffeine (EXCEDRIN MIGRAINE) 250-250-65 MG tablet Take 1 tablet by mouth every 6 (six) hours as needed for headache.   Yes [provider]  ibuprofen (ADVIL,MOTRIN) 200 MG  tablet Take 800 mg by mouth every 6 (six) hours as needed.   Yes [provider]  levonorgestrel (MIRENA) 20 MCG/24HR IUD 1 each by Intrauterine route once.   Yes [provider]  FLUoxetine (PROZAC) 20 MG tablet Take 1 tablet (20 mg total) by mouth daily. Patient not taking: Reported on 05/28/2018 02/13/18   Tysinger, Kermit Baloavid S, PA-C    Family History Family History  Problem Relation Age of Onset  . Thalassemia Mother   . Thalassemia Brother   . Cancer Maternal Grandmother        cervical cancer    Social History Social History   Tobacco Use  . Smoking status: Never Smoker  . Smokeless tobacco: Never Used  Substance Use Topics  . Alcohol use: No  . Drug use: No     Allergies   Percocet [oxycodone-acetaminophen] and Topamax [topiramate]   Review of Systems Review of Systems  Constitutional: Negative for appetite change.  HENT: Negative for congestion.   Eyes: Positive for photophobia.  Respiratory: Negative for shortness of breath.   Cardiovascular: Negative for chest pain.  Gastrointestinal: Positive  for nausea and vomiting. Negative for abdominal pain.  Genitourinary: Negative for flank pain.  Musculoskeletal: Negative for back pain.  Skin: Negative for rash.  Neurological: Negative for weakness.  Hematological: Does not bruise/bleed easily.  Psychiatric/Behavioral: Negative for confusion.     Physical Exam Updated Vital Signs BP 110/67   Pulse 72   Temp 98.4 F (36.9 C) (Oral)   Resp 16   Ht 5' (1.524 m)   Wt 83.9 kg (185 lb)   LMP 05/05/2018   SpO2 100%   BMI 36.13 kg/m   Physical Exam  Constitutional: She appears well-developed.  HENT:  Head: Normocephalic.  Eyes: Pupils are equal, round, and reactive to light.  Neck: Neck supple.  Cardiovascular: Normal rate.  Pulmonary/Chest: Effort normal.  Abdominal: Soft. There is no tenderness.  Musculoskeletal: She exhibits no edema.  Neurological: She is alert.  Skin: Skin is warm.  Capillary refill takes less than 2 seconds.     ED Treatments / Results  Labs (all labs ordered are listed, but only abnormal results are displayed) Labs Reviewed - No data to display  EKG None  Radiology No results found.  Procedures Procedures (including critical care time)  Medications Ordered in ED Medications  ketorolac (TORADOL) 30 MG/ML injection 30 mg (30 mg Intravenous Given 05/28/18 1728)  promethazine (PHENERGAN) injection 25 mg (25 mg Intravenous Given 05/28/18 1728)  sodium chloride 0.9 % bolus 1,000 mL (0 mLs Intravenous Stopped 05/28/18 1828)     Initial Impression / Assessment and Plan / ED Course  I have reviewed the triage vital signs and the nursing notes.  Pertinent labs & imaging results that were available during my care of the patient were reviewed by me and considered in my medical decision making (see chart for details).     Patient with migraine.  History of same.  Feels better after treatment.  Benign exam.  Discharge home.  Final Clinical Impressions(s) / ED Diagnoses   Final diagnoses:  Migraine without status migrainosus, not intractable, unspecified migraine type    ED Discharge Orders    None       Benjiman Core, MD 05/28/18 2222

## 2018-05-28 NOTE — ED Triage Notes (Signed)
Pt reports migraine for 3 days. Pt is nauseated and has light sensitivity. Reports she has taken OTC meds without relief

## 2018-07-07 ENCOUNTER — Ambulatory Visit: Payer: BLUE CROSS/BLUE SHIELD | Admitting: Medical

## 2019-03-13 ENCOUNTER — Encounter: Payer: Self-pay | Admitting: Medical

## 2019-03-13 ENCOUNTER — Ambulatory Visit (INDEPENDENT_AMBULATORY_CARE_PROVIDER_SITE_OTHER): Payer: Self-pay | Admitting: Medical

## 2019-03-13 ENCOUNTER — Other Ambulatory Visit: Payer: Self-pay

## 2019-03-13 VITALS — BP 108/90 | Temp 98.9°F | Ht 61.0 in | Wt 171.0 lb

## 2019-03-13 DIAGNOSIS — N920 Excessive and frequent menstruation with regular cycle: Secondary | ICD-10-CM | POA: Insufficient documentation

## 2019-03-13 DIAGNOSIS — G478 Other sleep disorders: Secondary | ICD-10-CM

## 2019-03-13 DIAGNOSIS — R0683 Snoring: Secondary | ICD-10-CM

## 2019-03-13 DIAGNOSIS — D509 Iron deficiency anemia, unspecified: Secondary | ICD-10-CM | POA: Insufficient documentation

## 2019-03-13 DIAGNOSIS — F988 Other specified behavioral and emotional disorders with onset usually occurring in childhood and adolescence: Secondary | ICD-10-CM | POA: Insufficient documentation

## 2019-03-13 DIAGNOSIS — G43009 Migraine without aura, not intractable, without status migrainosus: Secondary | ICD-10-CM

## 2019-03-13 DIAGNOSIS — D649 Anemia, unspecified: Secondary | ICD-10-CM | POA: Insufficient documentation

## 2019-03-13 MED ORDER — PROMETHAZINE HCL 12.5 MG PO TABS: 12.5000 mg | ORAL_TABLET | Freq: Three times a day (TID) | ORAL | 1 refills | Status: DC | PRN

## 2019-03-13 MED ORDER — PROPRANOLOL HCL 40 MG PO TABS: 40.0000 mg | ORAL_TABLET | Freq: Two times a day (BID) | ORAL | 1 refills | Status: DC

## 2019-03-13 MED ORDER — FERROUS GLUCONATE 324 (38 FE) MG PO TABS: 324.0000 mg | ORAL_TABLET | Freq: Two times a day (BID) | ORAL | 2 refills | Status: DC

## 2019-03-13 MED ORDER — BUTALBITAL-APAP-CAFFEINE 50-325-40 MG PO TABS: 1.0000 | ORAL_TABLET | Freq: Four times a day (QID) | ORAL | 0 refills | Status: DC | PRN

## 2019-03-13 NOTE — Patient Instructions (Signed)
It was good to talk to you today.   Recommendations:  Migraines:  Begin propanolol 40 mg twice daily.  This is a beta-blocker blood pressure pill is often used for migraine prevention.  Particular if your pulse tends to run a little on the high side or if you feel stressed at times, this may be helpful medication to reduce your symptoms.  Side effects could be fatigue or feeling sluggish.  Let me know if this happens  Begin Fioricet 1 tablet as needed every 6 hours for acute migraine.  If your headache has not improved after 1 or 2 doses that are then call our office  You have used promethazine/Phenergan in the past for nausea to help break a migraine.  You may use this along with the Fioricet to abort a migraine headache.  This can be used every 6 hours.  Caution as it may cause drowsiness  Both the Fioricet and promethazine are used as abortive therapy whereas the propanolol is meant for daily prevention of migraines  I will let you know if I find out that a study is open to candidates for migraines  I recommend a sleep study to evaluate for sleep apnea.  We use a company called [facility] [phone]) or [facility].  You could call them to find out what out-of-pocket cost may be to do a sleep study  Review the handout below on migraines  There are other newer medications like Amiovig that could be a good option for prevention, but these make be quite expensive.     Follow up within a month to let me know how the above regimen is working   Anemia  Begin ferrous gluconate twice daily iron supplement since her iron levels are low.  In the near future within the next month I would recommend an updated hemoglobin test to see where your hemoglobin level is  You may want to consider talking to your gynecologist about the heavy menstrual bleeding that is likely influencing your anemia   I recommend trying to get health insurance soon    Follow-up within a month  on these issues     Migraine Headache A migraine headache is an intense, throbbing pain on one side or both sides of the head. Migraines may also cause other symptoms, such as nausea, vomiting, and sensitivity to light and noise. What are the causes? Doing or taking certain things may also trigger migraines, such as:  Alcohol.  Smoking.  Medicines, such as: ? Medicine used to treat chest pain (nitroglycerine). ? Birth control pills. ? Estrogen pills. ? Certain blood pressure medicines.  Aged cheeses, chocolate, or caffeine.  Foods or drinks that contain nitrates, glutamate, aspartame, or tyramine.  Physical activity. Other things that may trigger a migraine include:  Menstruation.  Pregnancy.  Hunger.  Stress, lack of sleep, too much sleep, or fatigue.  Weather changes. What increases the risk? The following factors may make you more likely to experience migraine headaches:  Age. Risk increases with age.  Family history of migraine headaches.  Being Caucasian.  Depression and anxiety.  Obesity.  Being a woman.  Having a hole in the heart (patent foramen ovale) or other heart problems. What are the signs or symptoms? The main symptom of this condition is pulsating or throbbing pain. Pain may:  Happen in any area of the head, such as on one side or both sides.  Interfere with daily activities.  Get worse with physical activity.  Get  worse with exposure to bright lights or loud noises. Other symptoms may include:  Nausea.  Vomiting.  Dizziness.  General sensitivity to bright lights, loud noises, or smells. Before you get a migraine, you may get warning signs that a migraine is developing (aura). An aura may include:  Seeing flashing lights or having blind spots.  Seeing bright spots, halos, or zigzag lines.  Having tunnel vision or blurred vision.  Having numbness or a tingling feeling.  Having trouble talking.  Having muscle weakness.  How is this diagnosed? A migraine headache can be diagnosed based on:  Your symptoms.  A physical exam.  Tests, such as CT scan or MRI of the head. These imaging tests can help rule out other causes of headaches.  Taking fluid from the spine (lumbar puncture) and analyzing it (cerebrospinal fluid analysis, or CSF analysis). How is this treated? A migraine headache is usually treated with medicines that:  Relieve pain.  Relieve nausea.  Prevent migraines from coming back. Treatment may also include:  Acupuncture.  Lifestyle changes like avoiding foods that trigger migraines. Follow these instructions at home: Medicines  Take over-the-counter and prescription medicines only as told by your health care provider.  Do not drive or use heavy machinery while taking prescription pain medicine.  To prevent or treat constipation while you are taking prescription pain medicine, your health care provider may recommend that you: ? Drink enough fluid to keep your urine clear or pale yellow. ? Take over-the-counter or prescription medicines. ? Eat foods that are high in fiber, such as fresh fruits and vegetables, whole grains, and beans. ? Limit foods that are high in fat and processed sugars, such as fried and sweet foods. Lifestyle  Avoid alcohol use.  Do not use any products that contain nicotine or tobacco, such as cigarettes and e-cigarettes. If you need help quitting, ask your health care provider.  Get at least 8 hours of sleep every night.  Limit your stress. General instructions      Keep a journal to find out what may trigger your migraine headaches. For example, write down: ? What you eat and drink. ? How much sleep you get. ? Any change to your diet or medicines.  If you have a migraine: ? Avoid things that make your symptoms worse, such as bright lights. ? It may help to lie down in a dark, quiet room. ? Do not drive or use heavy machinery. ? Ask your health  care provider what activities are safe for you while you are experiencing symptoms.  Keep all follow-up visits as told by your health care provider. This is important. Contact a health care provider if:  You develop symptoms that are different or more severe than your usual migraine symptoms. Get help right away if:  Your migraine becomes severe.  You have a fever.  You have a stiff neck.  You have vision loss.  Your muscles feel weak or like you cannot control them.  You start to lose your balance often.  You develop trouble walking.  You faint. This information is not intended to replace advice given to you by your health care provider. Make sure you discuss any questions you have with your health care provider. Document Released: 10/29/2005 Document Revised: 05/18/2016 Document Reviewed: 04/16/2016 Elsevier Interactive Patient Education  2019 [employer]

## 2019-03-13 NOTE — Progress Notes (Signed)
This visit type was conducted due to national recommendations for restrictions regarding the COVID-19 Pandemic (e.g. social distancing) in an effort to limit this patient's exposure and mitigate transmission in our community.  Due to their co-morbid illnesses, this patient is at least at moderate risk for complications without adequate follow up.  This format is felt to be most appropriate for this patient at this time.    Documentation for virtual audio and video telecommunications through Zoom encounter:  The patient was located at home. The provider was located in the office. The patient did consent to this visit and is aware of possible charges through their insurance for this visit.  The other persons participating in this telemedicine service were none. Time spent on call was 18 minutes and in review of previous records >25 minutes total.  This virtual service is not related to other E/M service within previous 7 days.   Subjective: Chief Complaint  Patient presents with  . med check    med check    Virtual visit today for med check.  The last time I saw her April 2019 she was doing with depression.  She ended up establishing with a psychiatrist and is now seeing psychiatrist every 3 months, in [place].  Doing much better in that regard.  No depressed mood currently.  She is on Adderall for attention deficit issues.  Her main concern right now is migraines.  She has a long history of migraines.  Is seeing headache specialist in the past.  Currently she is getting 2-3 migraines every single week.  She gets photophobia, nausea, significant headaches that debilitate her.  In the past Topamax helped the migraines but caused her to have blood in the stool and other adverse symptoms.  She has been on nortriptyline in the past but does not recall if it worked or not.   In the past she has used various abortive therapies including Tylenol, ibuprofen, Excedrin, Maxalt.  She also did well  on Fioricet plus promethazine in the past.  She notes currently her triggers include workload stress, seasonal changes in the weather, and certainly certain foods and perfumes and chocolate and caffeine can trigger her headaches.  Sleep is not so good.  She knows she snores.  No known witnessed apnea.  She does not feel rested when she awakes and she gets sleepy during the day.  She has never had a sleep study.  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  She currently does not have inusrnace.   No other aggravating or relieving factors. No other complaint.  Past Medical History:  Diagnosis Date  . 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 LFT elevation    unclear etiology; negative work up in 08/2011.   . Migraine   . Positive ANA (antinuclear antibody)    resolved when she was evaluated by Rheumatology  . Vitamin D deficiency    Current Outpatient Medications on File Prior to Visit  Medication Sig Dispense Refill  . acetaminophen (TYLENOL) 500 MG tablet Take 2 tablets (1,000 mg total) by mouth 3 (three) times daily. (Patient taking differently: Take 500-1,000 mg by mouth 3 (three) times daily. ) 30 tablet 0  . amphetamine-dextroamphetamine (ADDERALL XR) 30 MG 24 hr capsule Take 30 mg by mouth every morning.    Marland Kitchen levonorgestrel (MIRENA)  20 MCG/24HR IUD 1 each by Intrauterine route once.     No current facility-administered medications on file prior to visit.     Objective: BP 108/90   Temp 98.9 F (37.2 C) (Temporal)   Ht  (1.549 m)   Wt 171 lb (77.6 kg)   LMP 03/08/2019 (Exact Date)   SpO2 99%   BMI 32.31 kg/m   Wt Readings from Last 3 Encounters:  03/13/19 171 lb (77.6 kg)  05/28/18 185 lb (83.9 kg)  02/13/18 185 lb 12.8 oz (84.3 kg)   General: Well-developed, well-nourished, no acute distress Psych: Answers  questions appropriate   Assessment: Encounter Diagnoses  Name Primary?  . Migraine without aura and without status migrainosus, not intractable Yes  . Anemia, unspecified type   . Attention deficit disorder, unspecified hyperactivity presence   . Snoring   . Menorrhagia with regular cycle   . Non-restorative sleep      Plan: We discussed her concerns, discussed her current uncontrolled migraines.  She will continue to see psychiatry every 3 months for ADHD.  Glad to hear no depression issues are current.  We discussed the following recommendations and medication changes today  Recommendations:  Migraines:  Begin propanolol 40 mg twice daily.  This is a beta-blocker blood pressure pill is often used for migraine prevention.  Particular if your pulse tends to run a little on the high side or if you feel stressed at times, this may be helpful medication to reduce your symptoms.  Side effects could be fatigue or feeling sluggish.  Let me know if this happens  Begin Fioricet 1 tablet as needed every 6 hours for acute migraine.  If your headache has not improved after 1 or 2 doses that are then call our office  You have used promethazine/Phenergan in the past for nausea to help break a migraine.  You may use this along with the Fioricet to abort a migraine headache.  This can be used every 6 hours.  Caution as it may cause drowsiness  Both the Fioricet and promethazine are used as abortive therapy whereas the propanolol is meant for daily prevention of migraines  I will let you know if I find out that a study is open to candidates for migraines  I recommend a sleep study to evaluate for sleep apnea.  We use a company called [facility] [phone]) or [facility].  You could call them to find out what out-of-pocket cost may be to do a sleep study  Review the handout below on migraines  There are other newer medications like Amiovig that could be a good option for  prevention, but these make be quite expensive.     Follow up within a month to let me know how the above regimen is working   Anemia  Begin ferrous gluconate twice daily iron supplement since her iron levels are low.  In the near future within the next month I would recommend an updated hemoglobin test to see where your hemoglobin level is  You may want to consider talking to your gynecologist about the heavy menstrual bleeding that is likely influencing your anemia   I recommend trying to get health insurance soon    Follow-up within a month on these issues  Jill Hobbs was seen today for med check.  Diagnoses and all orders for this visit:  Migraine without aura and without status migrainosus, not intractable  Anemia, unspecified type  Attention deficit disorder, unspecified hyperactivity presence  Snoring  Menorrhagia with regular cycle  Non-restorative sleep  Other orders -     promethazine (PHENERGAN) 12.5 MG tablet; Take 1 tablet (12.5 mg total) by mouth every 8 (eight) hours as needed for nausea or vomiting. -     butalbital-acetaminophen-caffeine (FIORICET) 50-325-40 MG tablet; Take 1-2 tablets by mouth every 6 (six) hours as needed for headache. -     ferrous gluconate (FERGON) 324 MG tablet; Take 1 tablet (324 mg total) by mouth 2 (two) times daily with a meal. -     propranolol (INDERAL) 40 MG tablet; Take 1 tablet (40 mg total) by mouth 2 (two) times daily.

## 2019-03-18 ENCOUNTER — Other Ambulatory Visit: Payer: Self-pay | Admitting: Medical

## 2019-03-25 ENCOUNTER — Encounter: Payer: Self-pay | Admitting: Medical

## 2019-04-09 ENCOUNTER — Other Ambulatory Visit: Payer: Self-pay | Admitting: Medical

## 2019-04-09 NOTE — Telephone Encounter (Signed)
Is this ok to refill?  

## 2019-06-08 ENCOUNTER — Other Ambulatory Visit: Payer: Self-pay | Admitting: Medical

## 2019-06-08 NOTE — Telephone Encounter (Signed)
Is this ok to refill?  

## 2019-07-18 ENCOUNTER — Other Ambulatory Visit: Payer: Self-pay

## 2019-07-21 MED ORDER — BUTALBITAL-APAP-CAFFEINE 50-325-40 MG PO TABS: 1.0000 | ORAL_TABLET | Freq: Four times a day (QID) | ORAL | 0 refills | Status: DC | PRN

## 2019-12-02 ENCOUNTER — Other Ambulatory Visit: Payer: Self-pay

## 2019-12-02 NOTE — Telephone Encounter (Signed)
Schedule her for physical and fasting labs, then send back to me and I will send prescription

## 2019-12-02 NOTE — Telephone Encounter (Signed)
Jill Hobbs is requesting to fil pt butalbital . Please advise [facility]

## 2019-12-03 NOTE — Telephone Encounter (Signed)
This may be duplicate from yesterday.  She is due for fasting physical.  Schedule  for CPX then I can refill

## 2019-12-03 NOTE — Telephone Encounter (Signed)
Is this okay to refill? 

## 2019-12-04 ENCOUNTER — Other Ambulatory Visit: Payer: Self-pay | Admitting: Medical

## 2019-12-04 MED ORDER — BUTALBITAL-APAP-CAFFEINE 50-325-40 MG PO TABS: 1.0000 | ORAL_TABLET | Freq: Four times a day (QID) | ORAL | 0 refills | Status: DC | PRN

## 2019-12-04 NOTE — Telephone Encounter (Signed)
You are correct that patient has not has a physical with [place]. Patient has been notified that she will have to schedule her physical.

## 2019-12-04 NOTE — Telephone Encounter (Signed)
Patient called and stated she is not due for a physical at this time. As it appears she's not due until May of 2021. Please advise refill request

## 2019-12-04 NOTE — Telephone Encounter (Signed)
Sent patient a message on mychart. Unable to reach patient by phone. Denying medication until appointment has been scheduled.

## 2019-12-04 NOTE — Telephone Encounter (Signed)
Appointment has been scheduled.

## 2019-12-04 NOTE — Telephone Encounter (Signed)
I'm confused.  I don't see any labs or physical visit at our office in the past 12 months.  Did she have phsyical somewhere else she is referring to?   Help me clarify so we can plan accordingly.   You can send refill

## 2020-01-06 ENCOUNTER — Other Ambulatory Visit: Payer: Self-pay

## 2020-01-06 ENCOUNTER — Encounter (HOSPITAL_COMMUNITY): Payer: Self-pay | Admitting: Emergency Medicine

## 2020-01-06 ENCOUNTER — Emergency Department (HOSPITAL_COMMUNITY): Payer: Self-pay

## 2020-01-06 ENCOUNTER — Emergency Department (HOSPITAL_COMMUNITY)
Admission: EM | Admit: 2020-01-06 | Discharge: 2020-01-06 | Disposition: A | Discharge status: Home / Self Care | Payer: Self-pay | Attending: Emergency Medicine | Admitting: Emergency Medicine

## 2020-01-06 DIAGNOSIS — Z79899 Other long term (current) drug therapy: Secondary | ICD-10-CM | POA: Insufficient documentation

## 2020-01-06 DIAGNOSIS — Y92410 Unspecified street and highway as the place of occurrence of the external cause: Secondary | ICD-10-CM | POA: Diagnosis not present

## 2020-01-06 DIAGNOSIS — Y9389 Activity, other specified: Secondary | ICD-10-CM | POA: Diagnosis not present

## 2020-01-06 DIAGNOSIS — S4991XA Unspecified injury of right shoulder and upper arm, initial encounter: Secondary | ICD-10-CM | POA: Diagnosis present

## 2020-01-06 DIAGNOSIS — S46911A Strain of unspecified muscle, fascia and tendon at shoulder and upper arm level, right arm, initial encounter: Secondary | ICD-10-CM | POA: Insufficient documentation

## 2020-01-06 DIAGNOSIS — Y999 Unspecified external cause status: Secondary | ICD-10-CM | POA: Insufficient documentation

## 2020-01-06 DIAGNOSIS — T148XXA Other injury of unspecified body region, initial encounter: Secondary | ICD-10-CM

## 2020-01-06 MED ORDER — PREDNISONE 10 MG PO TABS: ORAL_TABLET | ORAL | 0 refills | Status: DC

## 2020-01-06 MED ORDER — METHOCARBAMOL 500 MG PO TABS: 500.0000 mg | ORAL_TABLET | Freq: Three times a day (TID) | ORAL | 0 refills | Status: DC
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
## 2020-01-12 ENCOUNTER — Encounter: Payer: Self-pay | Admitting: Medical

## 2020-01-12 ENCOUNTER — Telehealth: Payer: Self-pay

## 2020-01-12 DIAGNOSIS — Z0000 Encounter for general adult medical examination without abnormal findings: Secondary | ICD-10-CM

## 2020-01-12 NOTE — Telephone Encounter (Signed)
E, send letter and no show fee

## 2020-01-12 NOTE — Telephone Encounter (Signed)

## 2020-01-15 ENCOUNTER — Encounter: Payer: Self-pay | Admitting: Medical

## 2020-01-15 NOTE — Telephone Encounter (Signed)
Please add fee to account 

## 2020-04-28 ENCOUNTER — Telehealth: Payer: Self-pay

## 2020-04-28 NOTE — Telephone Encounter (Signed)
Pt. Called to get rescheduled for CPE I got her scheduled 05/31/20 she also wanted to know if she could get a refill on her medication she said that she only has one that you fill for her at least until her apt.

## 2020-04-29 ENCOUNTER — Other Ambulatory Visit: Payer: Self-pay | Admitting: Medical

## 2020-04-29 MED ORDER — BUTALBITAL-APAP-CAFFEINE 50-325-40 MG PO TABS: 1.0000 | ORAL_TABLET | Freq: Four times a day (QID) | ORAL | 0 refills | Status: DC | PRN

## 2020-04-29 NOTE — Telephone Encounter (Signed)
Please verify medications in chart and which one she needs refilled

## 2020-04-29 NOTE — Telephone Encounter (Signed)
Patient stated she only needs a refill on her Migraine medication. Please advise refill

## 2020-05-31 ENCOUNTER — Other Ambulatory Visit: Payer: Self-pay

## 2020-05-31 ENCOUNTER — Ambulatory Visit (INDEPENDENT_AMBULATORY_CARE_PROVIDER_SITE_OTHER): Payer: Self-pay | Admitting: Medical

## 2020-05-31 ENCOUNTER — Encounter: Payer: Self-pay | Admitting: Medical

## 2020-05-31 ENCOUNTER — Telehealth: Payer: Self-pay | Admitting: Medical

## 2020-05-31 VITALS — BP 98/68 | HR 96 | Ht 61.0 in | Wt 168.2 lb

## 2020-05-31 DIAGNOSIS — G479 Sleep disorder, unspecified: Secondary | ICD-10-CM

## 2020-05-31 DIAGNOSIS — R0683 Snoring: Secondary | ICD-10-CM

## 2020-05-31 DIAGNOSIS — E611 Iron deficiency: Secondary | ICD-10-CM | POA: Insufficient documentation

## 2020-05-31 DIAGNOSIS — R110 Nausea: Secondary | ICD-10-CM | POA: Insufficient documentation

## 2020-05-31 DIAGNOSIS — G4700 Insomnia, unspecified: Secondary | ICD-10-CM

## 2020-05-31 DIAGNOSIS — G43009 Migraine without aura, not intractable, without status migrainosus: Secondary | ICD-10-CM

## 2020-05-31 DIAGNOSIS — D569 Thalassemia, unspecified: Secondary | ICD-10-CM | POA: Insufficient documentation

## 2020-05-31 DIAGNOSIS — D598 Other acquired hemolytic anemias: Secondary | ICD-10-CM

## 2020-05-31 DIAGNOSIS — E559 Vitamin D deficiency, unspecified: Secondary | ICD-10-CM

## 2020-05-31 DIAGNOSIS — F909 Attention-deficit hyperactivity disorder, unspecified type: Secondary | ICD-10-CM

## 2020-05-31 MED ORDER — ONDANSETRON HCL 4 MG PO TABS: 4.0000 mg | ORAL_TABLET | Freq: Three times a day (TID) | ORAL | 2 refills | Status: DC | PRN

## 2020-05-31 MED ORDER — BUTALBITAL-APAP-CAFFEINE 50-325-40 MG PO TABS: 1.0000 | ORAL_TABLET | Freq: Four times a day (QID) | ORAL | 1 refills | Status: DC | PRN

## 2020-05-31 MED ORDER — AMITRIPTYLINE HCL 10 MG PO TABS: 10.0000 mg | ORAL_TABLET | Freq: Every day | ORAL | 1 refills | Status: DC

## 2020-05-31 NOTE — Progress Notes (Signed)
Subjective: Chief Complaint  Patient presents with  . Annual Exam    already has labs done    Here for med check.  Initially scheduled for physical but she has no insurance.  She does get screening labs yearly at work and just had them done last week.  She has not yet got the results back yet.  She is pretty sure comprehensive metabolic panel, CBC, thyroid and cholesterol was done.  She has numerous concerns  She was recently put on amoxicillin from urgent care for sinus infection.  She is still taking this.  She has chronic migraines, still getting 2-3 migraines per week.  She notes migraines even from age 30.  She is seeing specialist and neurologist in the past.  She has had 2 MRIs done last several years, 1 in 2018 most recent that was normal.  She notes that certain odors, stress, hot temperatures all can flare of migraines.  She has tried various preventative medicines including propanolol, amitriptyline nortriptyline, Topamax.  Topamax helps but she has a lot of side effects so she does not take this.  She currently still uses butalbital Fioricet as needed on a regular basis.  She was going to enroll in drug study but her schedule has not allowed this.  She denies numbness tingling weakness.  She is a non-smoker.  Drinks limited alcohol rarely.  No drugs.  She has chronic problems with sleeping for at least 5 years.  No prior sleep study.  She has trouble staying asleep, gets up 1 or 2 times per night at least.  Has been on trazodone up to 100 mg without improvement.  Snore some but no concern for sleep apnea.  No family history of sleep apnea.  She would like Zofran refilled to help with nausea she gets with her migraines  ADHD-she has been seeing psychiatry in [place] but due to various problems with scheduling and location would like to start having this treated here, med refills here.  She notes having symptoms way back in childhood, had some initial consult since childhood but  really was not diagnosed officially until 2019.  She currently is doing fine on Adderall 30 mg twice a day which has been stable for the past year  History of vitamin D deficiency-not currently taking vitamin D.  Does not the outside a lot because he in the summertime really aggravates her migraines.    History of anemia-history of thalassemia, has had iron deficiency for years.  Periods occasionally heavy but usually okay.  She sees OB/GYN.  She has a [employer] streamer.  She may have seen blood in the stool once or twice but nothing regularly.  No prior colonoscopy   Past Medical History:  Diagnosis Date  . 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 LFT elevation    unclear etiology; negative work up in 08/2011.   . Migraine   . Positive ANA (antinuclear antibody)    resolved when she was evaluated by Rheumatology  . Vitamin D deficiency    Current Outpatient Medications on File Prior to Visit  Medication Sig Dispense Refill  . amoxicillin (AMOXIL) 875 MG tablet Take 875 mg by mouth 2 (two) times daily.    Marland Kitchen amphetamine-dextroamphetamine (ADDERALL XR) 30 MG 24 hr capsule Take 30 mg by mouth 2 (two) times daily.     Marland Kitchen levonorgestrel (MIRENA) 20 MCG/24HR IUD 1 each by Intrauterine route once.     No current facility-administered  medications on file prior to visit.   ROS as in subjective   Objective: BP 98/68   Ht 5\' 1"  (1.549 m)   Wt 168 lb 3.2 oz (76.3 kg)   BMI 31.78 kg/m   General appearence: alert, no distress, WD/WN, African American Neck: supple, no lymphadenopathy, no thyromegaly, no masses, no bruits Heart: RRR, normal S1, S2, no murmurs Lungs: CTA bilaterally, no wheezes, rhonchi, or rales Abdomen: +bs, soft, non tender, non distended, no masses, no hepatomegaly, no splenomegaly Extremities: no edema, no cyanosis, no clubbing Pulses: 2+ symmetric, upper and lower extremities, normal cap refill Neurological: alert, oriented x 3, CN2-12  intact, strength normal upper extremities and lower extremities, sensation normal throughout, DTRs 2+ throughout, no cerebellar signs, gait normal Psychiatric: normal affect, behavior normal, pleasant      Assessment: Encounter Diagnoses  Name Primary?  . Migraine without aura and without status migrainosus, not intractable Yes  . Nausea   . Thalassemia, unspecified type   . 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HCC)   . Attention deficit hyperactivity disorder (ADHD), unspecified ADHD type   . Sleep disturbance   . Insomnia, unspecified type   . Snoring   . Vitamin D deficiency   . Iron deficiency      Plan: We change the visit today for med check due to her lack of insurance and concern for charges/cost.  Migraines-not controlled.  She has tried numerous medications prior.  Having no insurance will limit some of the medications that are new or due to cost.  I again offered her a chance to be in drug study which would be helpful in providing free medicines and good follow-up but she declines.  Begin Amitrytline as a preventative measure.  Avoid triggers when possible.  Fioricet as needed.  If not seeing improvements over the next month or 2 either consider study or referral to headache specialist.  Reviewed 2018 MRI brain that was normal  Nausea associate with migraines-Zofran as needed  History of iron deficiency anemia, thalassemia-we discussed possible causes of anemia and iron deficiency.  She does have underlying thalassemia but we also discussed possible need for other GI eval in the future.  She will get me a copy of the recent labs she had done.  She has had chronically low iron.  Not currently taking iron therapy.    Vitamin D deficiency-advise she stay on vitamin D therapy.  She has limited outside time and sun exposure as this aggravates her migraines.  So she likely will continue to be vitamin D deficient  Snoring, insomnia-we discussed that it may be worth evaluating for  sleep apnea or sleep study to rule out other sleep issues.  She is also on medicine that can interfere with sleep being stimulant medicine.  She has failed trazodone at 100 mg daily before.  I will consider options for sleep aid.  We discussed sleep hygiene.  She declines sleep study at this time.  ADHD-we will request records from psychiatry.  She would like to have 2019 refill her medicines as this is more convenient for her than traveling all the way to [facility].  We discussed risk and benefits of stimulant medication.  She will get me a copy of her recent blood work she had done as screening through work  I reviewed her vision screen and other screenings today in the chart record  Jill Hobbs was seen today for annual exam.  Diagnoses and all orders for this visit:  Migraine without aura and without  status migrainosus, not intractable  Nausea  Thalassemia, unspecified type  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HCC)  Attention deficit hyperactivity disorder (ADHD), unspecified ADHD type  Sleep disturbance  Insomnia, unspecified type  Snoring  Vitamin D deficiency  Iron deficiency  Other orders -     amitriptyline (ELAVIL) 10 MG tablet; Take 1 tablet (10 mg total) by mouth at bedtime. -     butalbital-acetaminophen-caffeine (FIORICET) 50-325-40 MG tablet; Take 1-2 tablets by mouth every 6 (six) hours as needed for headache. -     ondansetron (ZOFRAN) 4 MG tablet; Take 1 tablet (4 mg total) by mouth every 8 (eight) hours as needed for nausea or vomiting.    Pending call back

## 2020-05-31 NOTE — Telephone Encounter (Signed)
Please call her back in reference to today's visit  I want to start her on amitriptyline 1 tablet daily in the evening or close to bedtime.  This medicine can be used as a migraine prevention medicine but can also help with sleep.  We will start low-dose and go up as tolerated or until we get improvement.  So we have several doses we can try over the next month or 2 if not helping reduce the headaches overall.  I refilled Zofran.  I refilled Fioricet for acute headache.  We will try to get records from psychiatry regarding the ADD medication  Have her get me labs ASAP  We will need to do some type of follow-up in roughly a month to see how she is doing in regards to the new medicine, the headaches, sleep, etc.

## 2020-05-31 NOTE — Telephone Encounter (Signed)
Message has been sent to patient via mychart  

## 2020-06-06 ENCOUNTER — Telehealth: Payer: Self-pay | Admitting: Medical

## 2020-06-06 NOTE — Telephone Encounter (Signed)
Requested records received from [facility]

## 2020-06-06 NOTE — Telephone Encounter (Signed)
Received requested records from [facility]

## 2020-06-08 ENCOUNTER — Other Ambulatory Visit: Payer: Self-pay | Admitting: Medical

## 2020-06-08 ENCOUNTER — Telehealth: Payer: Self-pay | Admitting: Medical

## 2020-06-08 MED ORDER — AMPHETAMINE-DEXTROAMPHET ER 30 MG PO CP24: 30.0000 mg | ORAL_CAPSULE | Freq: Every day | ORAL | 0 refills | Status: DC

## 2020-06-08 NOTE — Telephone Encounter (Signed)
I received records from [facility].  I did send a refill in on the medication Adderall.  I sent in for 30 tablets for 30-day supply of the XR.  I do not think it is appropriate to be on 2 of these a day due to various potential side effects.  Prior records show that she was getting refills of that particular version at 1 tablet a day but prior was getting the regular release for twice a day dosing.  She should keep her medications and safekeeping, not let other people use her medication.  We do drug screens periodically to make sure she is compliant.  This medicine does require routine follow-up.  As long as this is working okay I would want to see her back in 3 months in regards to this medication.

## 2020-06-09 NOTE — Telephone Encounter (Signed)
Message has been sent to patient via mychart  

## 2020-06-29 ENCOUNTER — Other Ambulatory Visit: Payer: Self-pay | Admitting: Medical

## 2020-09-01 ENCOUNTER — Other Ambulatory Visit: Payer: Self-pay

## 2020-09-01 ENCOUNTER — Encounter (HOSPITAL_COMMUNITY): Payer: Self-pay

## 2020-09-01 ENCOUNTER — Emergency Department (HOSPITAL_COMMUNITY)
Admission: EM | Admit: 2020-09-01 | Discharge: 2020-09-01 | Disposition: A | Discharge status: Home / Self Care | Payer: Self-pay | Attending: Emergency Medicine | Admitting: Emergency Medicine

## 2020-09-01 ENCOUNTER — Telehealth: Payer: Self-pay | Admitting: Medical

## 2020-09-01 DIAGNOSIS — G43009 Migraine without aura, not intractable, without status migrainosus: Secondary | ICD-10-CM

## 2020-09-01 DIAGNOSIS — R509 Fever, unspecified: Secondary | ICD-10-CM | POA: Insufficient documentation

## 2020-09-01 DIAGNOSIS — G43909 Migraine, unspecified, not intractable, without status migrainosus: Secondary | ICD-10-CM | POA: Insufficient documentation

## 2020-09-01 DIAGNOSIS — M436 Torticollis: Secondary | ICD-10-CM | POA: Insufficient documentation

## 2020-09-01 LAB — POC URINE PREG, ED: Preg Test, Ur: NEGATIVE

## 2020-09-01 MED ORDER — KETOROLAC TROMETHAMINE 60 MG/2ML IM SOLN
60.0000 mg | Freq: Once | INTRAMUSCULAR | Status: AC
  Administered 2020-09-01: 60 mg via INTRAMUSCULAR
  Filled 2020-09-01: qty 2

## 2020-09-01 MED ORDER — PROCHLORPERAZINE EDISYLATE 10 MG/2ML IJ SOLN
10.0000 mg | Freq: Once | INTRAMUSCULAR | Status: AC
  Administered 2020-09-01: 10 mg via INTRAVENOUS
  Filled 2020-09-01: qty 2

## 2020-09-01 MED ORDER — DIPHENHYDRAMINE HCL 50 MG/ML IJ SOLN
25.0000 mg | Freq: Once | INTRAMUSCULAR | Status: AC
  Administered 2020-09-01: 25 mg via INTRAVENOUS
  Filled 2020-09-01: qty 1

## 2020-09-01 NOTE — ED Triage Notes (Signed)
Pt. States  They have had a migraine for two days.

## 2020-09-01 NOTE — Telephone Encounter (Signed)
Please call about yesterday's ED visit.  I am not sure why she didn't call here for appt.   I had plenty of appt openings so I could have seen her for acute migraine.    Given her chronic problems, she should have f/u ever 4 months in general, so get her on the schedule.

## 2020-09-01 NOTE — ED Provider Notes (Signed)
[facility] EMERGENCY DEPARTMENT Provider Note   CSN: [id number] Arrival date & time: 09/01/20  1722     History Chief Complaint  Patient presents with  . Migraine    Jill Hobbs is a 34 y.o. female.  HPI 43-year-old female with history of migraine, beta thalassemia, elevated LFTs, positive ANA presents to the ER with complaints of 2 days of migraines.  Nausea, vomiting, fevers, chills, neck stiffness, vision changes.  States this presentation is typical for her migraines.  Normally will take Tylenol or ibuprofen and has Fioricet with little relief.  Has had ER visits for similar complaints.  Denies any head injury.  Not on blood thinners.    Past Medical History:  Diagnosis Date  . 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 LFT elevation    unclear etiology; negative work up in 08/2011.   . Migraine   . Positive ANA (antinuclear antibody)    resolved when she was evaluated by Rheumatology  . Vitamin D deficiency     Patient Active Problem List   Diagnosis Date Noted  . Migraine without aura and without status migrainosus, not intractable 05/31/2020  . Nausea 05/31/2020  . Thalassemia 05/31/2020  . 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  . Attention deficit hyperactivity disorder (ADHD) 05/31/2020  . Sleep disturbance 05/31/2020  . Insomnia 05/31/2020  . Iron deficiency 05/31/2020  . Vitamin D deficiency 05/31/2020  . Anemia 03/13/2019  . Attention deficit disorder 03/13/2019  . Snoring 03/13/2019  . Menorrhagia with regular cycle 03/13/2019  . Non-restorative sleep 03/13/2019  . Obesity 09/21/2014  . Migraine without aura 05/25/2014  . Migraine   . Vitamin D deficiency   . Beta thalassemia (HCC)   . LFT elevation   . Positive ANA (antinuclear antibody)     Past Surgical History:  Procedure Laterality Date  . WISDOM TOOTH EXTRACTION       OB History    Gravida  2   Para  2   Term  2    Preterm      AB      Living  2     SAB      TAB      Ectopic      Multiple      Live Births              Family History  Problem Relation Age of Onset  . Thalassemia Mother   . Thalassemia Brother   . Cancer Maternal Grandmother        cervical cancer    Social History   Tobacco Use  . Smoking status: Never Smoker  . Smokeless tobacco: Never Used  Substance Use Topics  . Alcohol use: Yes    Comment: social  . Drug use: No    Home Medications Prior to Admission medications   Medication Sig Start Date End Date Taking? Authorizing Provider  amitriptyline (ELAVIL) 10 MG tablet Take 1 tablet (10 mg total) by mouth at bedtime. 06/29/20   Tysinger, Kermit Balo, PA-C  amoxicillin (AMOXIL) 875 MG tablet Take 875 mg by mouth 2 (two) times daily.    [provider]  amphetamine-dextroamphetamine (ADDERALL XR) 30 MG 24 hr capsule Take 1 capsule (30 mg total) by mouth daily. 06/08/20   Tysinger, Kermit Balo, PA-C  butalbital-acetaminophen-caffeine (FIORICET) 8148071129 MG tablet Take 1-2 tablets by mouth every 6 (six) hours as needed for headache. 05/31/20   Tysinger, Kermit Balo,  PA-C  levonorgestrel (MIRENA) 20 MCG/24HR IUD 1 each by Intrauterine route once.    [provider]  ondansetron (ZOFRAN) 4 MG tablet Take 1 tablet (4 mg total) by mouth every 8 (eight) hours as needed for nausea or vomiting. 05/31/20   Tysinger, Kermit Balo, PA-C    Allergies    Percocet [oxycodone-acetaminophen] and Topamax [topiramate]  Review of Systems   Review of Systems  Constitutional: Negative for chills and fever.  HENT: Negative for ear pain and sore throat.   Eyes: Negative for pain and visual disturbance.  Respiratory: Negative for cough and shortness of breath.   Cardiovascular: Negative for chest pain and palpitations.  Musculoskeletal: Negative for arthralgias and back pain.  Skin: Negative for color change and rash.  Neurological: Positive for headaches. Negative for  seizures, syncope and weakness.  All other systems reviewed and are negative.   Physical Exam Updated Vital Signs BP 116/76 (BP Location: Right Arm)   Pulse (!) 104   Temp 98.8 F (37.1 C) (Oral)   Resp 15   Ht 5\' 1"  (1.549 m)   Wt 74.4 kg   SpO2 98%   BMI 30.99 kg/m   Physical Exam Vitals and nursing note reviewed.  Constitutional:      General: She is not in acute distress.    Appearance: She is well-developed.  HENT:     Head: Normocephalic and atraumatic.  Eyes:     Conjunctiva/sclera: Conjunctivae normal.  Cardiovascular:     Rate and Rhythm: Normal rate and regular rhythm.     Pulses: Normal pulses.     Heart sounds: Normal heart sounds. No murmur heard.   Pulmonary:     Effort: Pulmonary effort is normal. No respiratory distress.     Breath sounds: Normal breath sounds.  Abdominal:     Palpations: Abdomen is soft.     Tenderness: There is no abdominal tenderness.  Musculoskeletal:     Cervical back: Neck supple.  Skin:    General: Skin is warm and dry.  Neurological:     General: No focal deficit present.     Mental Status: She is alert and oriented to person, place, and time.     Cranial Nerves: No cranial nerve deficit.     Sensory: No sensory deficit.     Motor: No weakness.     Comments: Speech clear, fluent without evidence of aphasia.  Smile equal, normal eyebrow raise, tongue without evidence of fasciculations.  Grip strength equal on both sides, 5/5 strength in lower extremities.     ED Results / Procedures / Treatments   Labs (all labs ordered are listed, but only abnormal results are displayed) Labs Reviewed  POC URINE PREG, ED    EKG None  Radiology No results found.  Procedures Procedures (including critical care time)  Medications Ordered in ED Medications  ketorolac (TORADOL) injection 60 mg (60 mg Intramuscular Given 09/01/20 1840)  prochlorperazine (COMPAZINE) injection 10 mg (10 mg Intravenous Given 09/01/20 1840)    diphenhydrAMINE (BENADRYL) injection 25 mg (25 mg Intravenous Given 09/01/20 1839)    ED Course  I have reviewed the triage vital signs and the nursing notes.  Pertinent labs & imaging results that were available during my care of the patient were reviewed by me and considered in my medical decision making (see chart for details).    MDM Rules/Calculators/A&P  Patient complains of 2 days of a migraine.  Vitals reassuring, logic deficits noted.  Patient states this is very typical for her migraines.  Her pregnancy test here is negative.  Low suspicion for intracranial abnormality, meningitis.  Patient was given migraine cocktail including Toradol, Compazine and Benadryl.  On reevaluation, patient notes significant improvement in her symptoms.  She would like to go home.  Encouraged follow-up with PCP.  She was understanding and is agreeable.  Return precautions discussed.  At this stage in the ED course, the patient is medically screened and stable for discharge. Final Clinical Impression(s) / ED Diagnoses Final diagnoses:  Migraine without aura and without status migrainosus, not intractable    Rx / DC Orders ED Discharge Orders    None       Leone Brand 09/01/20 1911    Bethann Berkshire, MD 09/02/20 [phone]

## 2020-09-06 NOTE — Telephone Encounter (Signed)
Pt scheduled  

## 2020-09-07 ENCOUNTER — Telehealth: Payer: Self-pay | Admitting: Medical

## 2021-07-03 ENCOUNTER — Other Ambulatory Visit: Payer: Self-pay

## 2021-07-03 ENCOUNTER — Encounter: Payer: Self-pay | Admitting: Medical

## 2021-07-03 ENCOUNTER — Ambulatory Visit (INDEPENDENT_AMBULATORY_CARE_PROVIDER_SITE_OTHER): Payer: Medicaid Other | Admitting: Medical

## 2021-07-03 VITALS — BP 120/70 | HR 88 | Ht 63.0 in | Wt 184.0 lb

## 2021-07-03 DIAGNOSIS — Z23 Encounter for immunization: Secondary | ICD-10-CM | POA: Insufficient documentation

## 2021-07-03 DIAGNOSIS — Z13220 Encounter for screening for lipoid disorders: Secondary | ICD-10-CM | POA: Insufficient documentation

## 2021-07-03 DIAGNOSIS — R7989 Other specified abnormal findings of blood chemistry: Secondary | ICD-10-CM | POA: Diagnosis not present

## 2021-07-03 DIAGNOSIS — Z6832 Body mass index (BMI) 32.0-32.9, adult: Secondary | ICD-10-CM | POA: Diagnosis not present

## 2021-07-03 DIAGNOSIS — Z131 Encounter for screening for diabetes mellitus: Secondary | ICD-10-CM | POA: Diagnosis not present

## 2021-07-03 DIAGNOSIS — G479 Sleep disorder, unspecified: Secondary | ICD-10-CM | POA: Diagnosis not present

## 2021-07-03 DIAGNOSIS — D561 Beta thalassemia: Secondary | ICD-10-CM | POA: Diagnosis not present

## 2021-07-03 DIAGNOSIS — Z7185 Encounter for immunization safety counseling: Secondary | ICD-10-CM

## 2021-07-03 DIAGNOSIS — Z0000 Encounter for general adult medical examination without abnormal findings: Secondary | ICD-10-CM | POA: Diagnosis not present

## 2021-07-03 MED ORDER — BUTALBITAL-APAP-CAFFEINE 50-325-40 MG PO TABS
1.0000 | ORAL_TABLET | Freq: Four times a day (QID) | ORAL | 1 refills | Status: DC | PRN
Start: 2021-07-03 — End: 2021-08-01

## 2021-07-03 NOTE — Progress Notes (Addendum)
Subjective:   HPI  Jill Hobbs is a 35 y.o. female who presents for Chief Complaint  Patient presents with   fasting cpe    Fasting cpe, [facility]    Patient Care Team: Revia Nghiem, Kermit Balo, PA-C as PCP - General (Family Medicine) Sees dentist Sees eye doctor [facility]   Concerns: Here for recheck on migraines.  She has been on Topamax and amitriptyline in the past but did not do well on either.  She also had side effects from Topamax.  She does not want to take a pill every day.  She would like a refill of Fioricet for as needed use.  She is still getting headaches fairly often and wants something for prevention  She is not currently exercising.  She is frustrated with her weight.  No matter exercise she does she does not seem to lose weight.  She continues to have problems with sleep.  Has trouble getting to sleep and staying asleep.  No prior sleep study.  She is not sure if she snores.  She does not feel rested when she wakes up.  History of elevated liver test.  No concern for hepatitis.  No heavy alcohol use  Reviewed their medical, surgical, family, social, medication, and allergy history and updated chart as appropriate.  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   CT abdomen shows normal liver 2018   Migraine    Positive ANA (antinuclear antibody)    resolved when she was evaluated by Rheumatology   Vitamin D deficiency     Family History  Problem Relation Age of Onset   Thalassemia Mother    Thalassemia Brother    Heart disease Maternal Grandmother    Cancer Other        cervical in great grandmother   Stroke Neg Hx    Diabetes Neg Hx      Current Outpatient Medications:    Erenumab-aooe (AIMOVIG) 70 MG/ML SOAJ, Inject 70 mg into the skin every 30 (thirty) days., Disp: 1.12 mL, Rfl:  0   levonorgestrel (MIRENA) 20 MCG/24HR IUD, 1 each by Intrauterine route once., Disp: , Rfl:    butalbital-acetaminophen-caffeine (FIORICET) 50-325-40 MG tablet, Take 1-2 tablets by mouth every 6 (six) hours as needed for headache., Disp: 20 tablet, Rfl: 1  Allergies  Allergen Reactions   Percocet [Oxycodone-Acetaminophen] Rash   Topamax [Topiramate] Rash and Other (See Comments)    Blood in stool     Review of Systems Constitutional: -fever, -chills, -sweats, -unexpected weight change, -decreased appetite, -fatigue Allergy: -sneezing, -itching, -congestion Dermatology: -changing moles, --rash, -lumps ENT: -runny nose, -ear pain, -sore throat, -hoarseness, -sinus pain, -teeth pain, - ringing in ears, -hearing loss, -nosebleeds Cardiology: -chest pain, -palpitations, -swelling, -difficulty breathing when lying flat, -waking up short of breath Respiratory: -cough, -shortness of breath, -difficulty breathing with exercise or exertion, -wheezing, -coughing up blood Gastroenterology: -abdominal pain, -nausea, -vomiting, -diarrhea, -constipation, -blood in stool, -changes in bowel movement, -difficulty swallowing or eating Hematology: -bleeding, -bruising  Musculoskeletal: -joint aches, -muscle aches, -joint swelling, -back pain, -neck pain, -cramping, -changes in gait Ophthalmology: denies vision changes, eye redness, itching, discharge Urology: -burning with urination, -difficulty urinating, -blood in urine, -urinary frequency, -urgency, -incontinence Neurology: +headache, -weakness, -tingling, -numbness, -memory loss, -falls, -dizziness Psychology: -depressed mood, -agitation, +sleep problems Breast/gyn: -breast  tendnerss, -discharge, -lumps, -vaginal discharge,- irregular periods, -heavy periods   [facility] 07/03/2021 05/31/2020 02/13/2018 10/01/2017  Decreased Interest 0 0 2 0  Down, Depressed, Hopeless 0 0 1 0  PHQ - 2 Score 0 0 3 0  Altered sleeping - - 3 -  Tired,  decreased energy - - 3 -  Change in appetite - - 3 -  Feeling bad or failure about yourself  - - 1 -  Trouble concentrating - - 3 -  Moving slowly or fidgety/restless - - 0 -  Suicidal thoughts - - 0 -  PHQ-9 Score - - 16 -  Difficult doing work/chores - - Very difficult -       Objective:  BP 120/70   Pulse 88   Ht 5\' 3"  (1.6 m)   Wt 184 lb (83.5 kg)   LMP 07/03/2021   BMI 32.59 kg/m   General appearance: alert, no distress, WD/WN, African American female Skin: unremarkable, tattoo of writing upper mid back, butterfly tattoo dorsal right distal arm HEENT: normocephalic, conjunctiva/corneas normal, sclerae anicteric, PERRLA, EOMi, nares patent, no discharge or erythema, pharynx normal Neck: supple, no lymphadenopathy, no thyromegaly, no masses, normal ROM, no bruits Chest: non tender, normal shape and expansion Heart: RRR, normal S1, S2, no murmurs Lungs: CTA bilaterally, no wheezes, rhonchi, or rales Abdomen: +bs, soft, non tender, non distended, no masses, no hepatomegaly, no splenomegaly, no bruits Back: non tender, normal ROM, no scoliosis Musculoskeletal: upper extremities non tender, no obvious deformity, normal ROM throughout, lower extremities non tender, no obvious deformity, normal ROM throughout Extremities: no edema, no cyanosis, no clubbing Pulses: 2+ symmetric, upper and lower extremities, normal cap refill Neurological: alert, oriented x 3, CN2-12 intact, strength normal upper extremities and lower extremities, sensation normal throughout, DTRs 2+ throughout, no cerebellar signs, gait normal Psychiatric: normal affect, behavior normal, pleasant  Breast/gyn/rectal - deferred to gynecology     Assessment and Plan :   Encounter Diagnoses  Name Primary?   Encounter for health maintenance examination in adult Yes   Screening for diabetes mellitus    BMI 32.0-32.9,adult    Vaccine counseling    Beta-thalassemia (HCC)    Need for Tdap vaccination    Screening  for lipid disorders    Sleep disturbance    Elevated LFTs      This visit was a preventative care visit, also known as wellness visit or routine physical.   Topics typically include healthy lifestyle, diet, exercise, preventative care, vaccinations, sick and well care, proper use of emergency dept and after hours care, as well as other concerns.     Recommendations: Continue to return yearly for your annual wellness and preventative care visits.  This gives us a chance to discuss healthy lifestyle, exercise, vaccinations, review your chart record, and perform screenings where appropriate.  I recommend you see your eye doctor yearly for routine vision care.  I recommend you see your dentist yearly for routine dental care including hygiene visits twice yearly.   Vaccination recommendations were reviewed Immunization History  Administered Date(s) Administered   PPD Test 04/13/2011   Tdap 07/03/2021   I recommend a yearly flu shot in the fall  Counseled on the Tdap (tetanus, diptheria, and acellular pertussis) vaccine.  Vaccine information sheet given. Tdap vaccine given after consent obtained.    Screening for cancer: Colon cancer screening: Age 35  Breast cancer screening: You should perform a self breast exam monthly.   We reviewed recommendations for regular  mammograms and breast cancer screening.  Cervical cancer screening: We reviewed recommendations for pap smear screening.   Skin cancer screening: Check your skin regularly for new changes, growing lesions, or other lesions of concern Come in for evaluation if you have skin lesions of concern.  Lung cancer screening: If you have a greater than 20 pack year history of tobacco use, then you may qualify for lung cancer screening with a chest CT scan.   Please call your insurance company to inquire about coverage for this test.  We currently don't have screenings for other cancers besides breast, cervical, colon, and  lung cancers.  If you have a strong family history of cancer or have other cancer screening concerns, please let me know.    Bone health: Get at least 150 minutes of aerobic exercise weekly Get weight bearing exercise at least once weekly Bone density test:  A bone density test is an imaging test that uses a type of X-ray to measure the amount of calcium and other minerals in your bones. The test may be used to diagnose or screen you for a condition that causes weak or thin bones (osteoporosis), predict your risk for a broken bone (fracture), or determine how well your osteoporosis treatment is working. The bone density test is recommended for females 65 and older, or females or males <65 if certain risk factors such as thyroid disease, long term use of steroids such as for asthma or rheumatological issues, vitamin D deficiency, estrogen deficiency, family history of osteoporosis, self or family history of fragility fracture in first degree relative.    Heart health: Get at least 150 minutes of aerobic exercise weekly Limit alcohol It is important to maintain a healthy blood pressure and healthy cholesterol numbers  Heart disease screening: Screening for heart disease includes screening for blood pressure, fasting lipids, glucose/diabetes screening, BMI height to weight ratio, reviewed of smoking status, physical activity, and diet.    Goals include blood pressure 120/80 or less, maintaining a healthy lipid/cholesterol profile, preventing diabetes or keeping diabetes numbers under good control, not smoking or using tobacco products, exercising most days per week or at least 150 minutes per week of exercise, and eating healthy variety of fruits and vegetables, healthy oils, and avoiding unhealthy food choices like fried food, fast food, high sugar and high cholesterol foods.    Other tests may possibly include EKG test, CT coronary calcium score, echocardiogram, exercise treadmill stress test.     Medical care options: I recommend you continue to seek care here first for routine care.  We try really hard to have available appointments Monday through Friday daytime hours for sick visits, acute visits, and physicals.  Urgent care should be used for after hours and weekends for significant issues that cannot wait till the next day.  The emergency department should be used for significant potentially life-threatening emergencies.  The emergency department is expensive, can often have long wait times for less significant concerns, so try to utilize primary care, urgent care, or telemedicine when possible to avoid unnecessary trips to the emergency department.  Virtual visits and telemedicine have been introduced since the pandemic started in 2020, and can be convenient ways to receive medical care.  We offer virtual appointments as well to assist you in a variety of options to seek medical care.   Separate significant issues discussed: BMI > 30  I recommend you exercise most days per week 45 to 60 minutes I recommend a healthy low-fat low sugar  diet I recommend setting some short-term goals and working with a friend to lose weight together Consider medication, consider referral.  I can refer you to weight management clinic or bariatric clinic for consult There are medications we use for chronic weight management as well   Sleep disturbance I am going to refer you to neurology for sleep evaluation particular given the weight issues and headaches   Migraines Begin trial of Aimovig injection for prevention I refilled your Fioricet for as needed use for abortive therapy Reduce stress when possible I will need you to follow-up in 6-8 weeks on new migraine medication You have already tried Topamax and Amitriptyline   Elevated liver test Prior eval in 2012, no etiology found.  As of last year labs were back to normal, and 2018 CT abdomen shows no liver lesions.    Angelyna was seen  today for fasting cpe.  Diagnoses and all orders for this visit:  Encounter for health maintenance examination in adult -     Comprehensive metabolic panel -     CBC with Differential/Platelet -     Iron, TIBC and Ferritin Panel -     Lipid panel -     TSH -     Hemoglobin A1c  Screening for diabetes mellitus -     Hemoglobin A1c  BMI 32.0-32.9,adult -     Comprehensive metabolic panel -     TSH -     Hemoglobin A1c  Vaccine counseling  Beta-thalassemia (HCC) -     CBC with Differential/Platelet  Need for Tdap vaccination  Screening for lipid disorders -     Lipid panel  Sleep disturbance -     Ambulatory referral to Neurology  Elevated LFTs  Other orders -     butalbital-acetaminophen-caffeine (FIORICET) 50-325-40 MG tablet; Take 1-2 tablets by mouth every 6 (six) hours as needed for headache. -     Tdap vaccine greater than or equal to 7yo IM -     Erenumab-aooe (AIMOVIG) 70 MG/ML SOAJ; Inject 70 mg into the skin every 30 (thirty) days.   Follow-up pending labs, yearly for physical

## 2021-07-04 DIAGNOSIS — H5213 Myopia, bilateral: Secondary | ICD-10-CM | POA: Diagnosis not present

## 2021-07-04 DIAGNOSIS — Z6832 Body mass index (BMI) 32.0-32.9, adult: Secondary | ICD-10-CM

## 2021-07-04 LAB — COMPREHENSIVE METABOLIC PANEL
ALT: 17 IU/L (ref 0–32)
AST: 14 IU/L (ref 0–40)
Albumin/Globulin Ratio: 1.6 (ref 1.2–2.2)
Albumin: 4.4 g/dL (ref 3.8–4.8)
Alkaline Phosphatase: 49 IU/L (ref 44–121)
BUN/Creatinine Ratio: 13 (ref 9–23)
BUN: 10 mg/dL (ref 6–20)
Bilirubin Total: 0.2 mg/dL (ref 0.0–1.2)
CO2: 19 mmol/L — ABNORMAL LOW (ref 20–29)
Calcium: 9.7 mg/dL (ref 8.7–10.2)
Chloride: 106 mmol/L (ref 96–106)
Creatinine, Ser: 0.75 mg/dL (ref 0.57–1.00)
Globulin, Total: 2.8 g/dL (ref 1.5–4.5)
Glucose: 88 mg/dL (ref 65–99)
Potassium: 4.4 mmol/L (ref 3.5–5.2)
Sodium: 140 mmol/L (ref 134–144)
Total Protein: 7.2 g/dL (ref 6.0–8.5)
eGFR: 106 mL/min/{1.73_m2} (ref >59)

## 2021-07-04 LAB — TSH: TSH: 1.48 u[IU]/mL (ref 0.450–4.500)

## 2021-07-04 LAB — IRON,TIBC AND FERRITIN PANEL
Ferritin: 389 ng/mL — ABNORMAL HIGH (ref 15–150)
Iron Saturation: 25 % (ref 15–55)
Iron: 64 ug/dL (ref 27–159)
Total Iron Binding Capacity: 254 ug/dL (ref 250–450)
UIBC: 190 ug/dL (ref 131–425)

## 2021-07-04 LAB — CBC WITH DIFFERENTIAL/PLATELET
Basophils Absolute: 0 10*3/uL (ref 0.0–0.2)
Basos: 1 %
EOS (ABSOLUTE): 0.1 10*3/uL (ref 0.0–0.4)
Eos: 1 %
Hematocrit: 37.9 % (ref 34.0–46.6)
Hemoglobin: 11.8 g/dL (ref 11.1–15.9)
Immature Grans (Abs): 0 10*3/uL (ref 0.0–0.1)
Immature Granulocytes: 0 %
Lymphocytes Absolute: 1.4 10*3/uL (ref 0.7–3.1)
Lymphs: 25 %
MCH: 21.7 pg — ABNORMAL LOW (ref 26.6–33.0)
MCHC: 31.1 g/dL — ABNORMAL LOW (ref 31.5–35.7)
MCV: 70 fL — ABNORMAL LOW (ref 79–97)
Monocytes Absolute: 0.3 10*3/uL (ref 0.1–0.9)
Monocytes: 6 %
Neutrophils Absolute: 3.6 10*3/uL (ref 1.4–7.0)
Neutrophils: 67 %
Platelets: 346 10*3/uL (ref 150–450)
RBC: 5.43 x10E6/uL — ABNORMAL HIGH (ref 3.77–5.28)
RDW: 15 % (ref 11.7–15.4)
WBC: 5.4 10*3/uL (ref 3.4–10.8)

## 2021-07-04 LAB — LIPID PANEL
Chol/HDL Ratio: 4.6 ratio — ABNORMAL HIGH (ref 0.0–4.4)
Cholesterol, Total: 190 mg/dL (ref 100–199)
HDL: 41 mg/dL (ref >39)
LDL Chol Calc (NIH): 133 mg/dL — ABNORMAL HIGH (ref 0–99)
Triglycerides: 86 mg/dL (ref 0–149)
VLDL Cholesterol Cal: 16 mg/dL (ref 5–40)

## 2021-07-04 LAB — HEMOGLOBIN A1C
Est. average glucose Bld gHb Est-mCnc: 117 mg/dL
Hgb A1c MFr Bld: 5.7 % — ABNORMAL HIGH (ref 4.8–5.6)

## 2021-07-04 MED ORDER — AIMOVIG 70 MG/ML ~~LOC~~ SOAJ: 70.0000 mg | SUBCUTANEOUS | 0 refills | Status: DC

## 2021-07-04 NOTE — Addendum Note (Signed)
Addended by: Jac Canavan on: 07/04/2021 09:20 AM   Modules accepted: Orders

## 2021-07-04 NOTE — Patient Instructions (Signed)
This visit was a preventative care visit, also known as wellness visit or routine physical.   Topics typically include healthy lifestyle, diet, exercise, preventative care, vaccinations, sick and well care, proper use of emergency dept and after hours care, as well as other concerns.     Recommendations: Continue to return yearly for your annual wellness and preventative care visits.  This gives [place] a chance to discuss healthy lifestyle, exercise, vaccinations, review your chart record, and perform screenings where appropriate.  I recommend you see your eye doctor yearly for routine vision care.  I recommend you see your dentist yearly for routine dental care including hygiene visits twice yearly.   Vaccination recommendations were reviewed Immunization History  Administered Date(s) Administered   PPD Test 04/13/2011   Tdap 07/03/2021   I recommend a yearly flu shot in the fall  Counseled on the Tdap (tetanus, diptheria, and acellular pertussis) vaccine.  Vaccine information sheet given. Tdap vaccine given after consent obtained.    Screening for cancer: Colon cancer screening: Age 66  Breast cancer screening: You should perform a self breast exam monthly.   We reviewed recommendations for regular mammograms and breast cancer screening.  Cervical cancer screening: We reviewed recommendations for pap smear screening.   Skin cancer screening: Check your skin regularly for new changes, growing lesions, or other lesions of concern Come in for evaluation if you have skin lesions of concern.  Lung cancer screening: If you have a greater than 20 pack year history of tobacco use, then you may qualify for lung cancer screening with a chest CT scan.   Please call your insurance company to inquire about coverage for this test.  We currently don't have screenings for other cancers besides breast, cervical, colon, and lung cancers.  If you have a strong family history of cancer or have  other cancer screening concerns, please let me know.    Bone health: Get at least 150 minutes of aerobic exercise weekly Get weight bearing exercise at least once weekly Bone density test:  A bone density test is an imaging test that uses a type of X-ray to measure the amount of calcium and other minerals in your bones. The test may be used to diagnose or screen you for a condition that causes weak or thin bones (osteoporosis), predict your risk for a broken bone (fracture), or determine how well your osteoporosis treatment is working. The bone density test is recommended for females 65 and older, or females or males <65 if certain risk factors such as thyroid disease, long term use of steroids such as for asthma or rheumatological issues, vitamin D deficiency, estrogen deficiency, family history of osteoporosis, self or family history of fragility fracture in first degree relative.    Heart health: Get at least 150 minutes of aerobic exercise weekly Limit alcohol It is important to maintain a healthy blood pressure and healthy cholesterol numbers  Heart disease screening: Screening for heart disease includes screening for blood pressure, fasting lipids, glucose/diabetes screening, BMI height to weight ratio, reviewed of smoking status, physical activity, and diet.    Goals include blood pressure 120/80 or less, maintaining a healthy lipid/cholesterol profile, preventing diabetes or keeping diabetes numbers under good control, not smoking or using tobacco products, exercising most days per week or at least 150 minutes per week of exercise, and eating healthy variety of fruits and vegetables, healthy oils, and avoiding unhealthy food choices like fried food, fast food, high sugar and high cholesterol foods.  Other tests may possibly include EKG test, CT coronary calcium score, echocardiogram, exercise treadmill stress test.    Medical care options: I recommend you continue to seek care  here first for routine care.  We try really hard to have available appointments Monday through Friday daytime hours for sick visits, acute visits, and physicals.  Urgent care should be used for after hours and weekends for significant issues that cannot wait till the next day.  The emergency department should be used for significant potentially life-threatening emergencies.  The emergency department is expensive, can often have long wait times for less significant concerns, so try to utilize primary care, urgent care, or telemedicine when possible to avoid unnecessary trips to the emergency department.  Virtual visits and telemedicine have been introduced since the pandemic started in 2020, and can be convenient ways to receive medical care.  We offer virtual appointments as well to assist you in a variety of options to seek medical care.   Separate significant issues discussed: BMI > 30  I recommend you exercise most days per week 45 to 60 minutes I recommend a healthy low-fat low sugar diet I recommend setting some short-term goals and working with a friend to lose weight together Consider medication, consider referral.  I can refer you to weight management clinic or bariatric clinic for consult There are medications we use for chronic weight management as well   Sleep disturbance I am going to refer you to neurology for sleep evaluation particular given the weight issues and headaches   Migraines Begin trial of Aimovig injection for prevention I refilled your Fioricet for as needed use for abortive therapy Reduce stress when possible I will need you to follow-up in 6-8 weeks on new migraine medication You have already tried Topamax and Amitriptyline   Elevated liver test Prior eval in 2012, no etiology found.  As of last year labs were back to normal, and 2018 CT abdomen shows no liver lesions.

## 2021-07-05 DIAGNOSIS — Z01411 Encounter for gynecological examination (general) (routine) with abnormal findings: Secondary | ICD-10-CM | POA: Diagnosis not present

## 2021-07-05 LAB — HM PAP SMEAR: HM Pap smear: NEGATIVE

## 2021-07-05 LAB — RESULTS CONSOLE HPV: CHL HPV: NEGATIVE

## 2021-07-13 DIAGNOSIS — Z419 Encounter for procedure for purposes other than remedying health state, unspecified: Secondary | ICD-10-CM | POA: Diagnosis not present

## 2021-07-24 ENCOUNTER — Other Ambulatory Visit: Payer: Self-pay | Admitting: Medical

## 2021-07-24 MED ORDER — AIMOVIG 70 MG/ML ~~LOC~~ SOAJ: 70.0000 mg | SUBCUTANEOUS | 2 refills | Status: DC

## 2021-07-27 ENCOUNTER — Encounter: Payer: Self-pay | Admitting: Internal Medicine

## 2021-07-27 ENCOUNTER — Telehealth: Payer: Self-pay

## 2021-07-27 ENCOUNTER — Telehealth: Payer: Self-pay | Admitting: Internal Medicine

## 2021-07-27 NOTE — Telephone Encounter (Signed)
I will request this

## 2021-07-27 NOTE — Telephone Encounter (Signed)
Pt. Called back stating that she had her last PAP 8/22 at [facility] in [place].

## 2021-07-27 NOTE — Telephone Encounter (Signed)
Forwarding message to Sab.

## 2021-07-27 NOTE — Telephone Encounter (Signed)
Sent pt mychart message about pap

## 2021-07-28 ENCOUNTER — Encounter: Payer: Self-pay | Admitting: Internal Medicine

## 2021-07-30 ENCOUNTER — Telehealth: Payer: Self-pay

## 2021-07-30 NOTE — Telephone Encounter (Signed)
P.A. AIMOVIG °

## 2021-08-01 ENCOUNTER — Other Ambulatory Visit: Payer: Self-pay

## 2021-08-01 ENCOUNTER — Telehealth (INDEPENDENT_AMBULATORY_CARE_PROVIDER_SITE_OTHER): Payer: Medicaid Other | Admitting: Medical

## 2021-08-01 VITALS — Wt 180.0 lb

## 2021-08-01 DIAGNOSIS — G43009 Migraine without aura, not intractable, without status migrainosus: Secondary | ICD-10-CM | POA: Diagnosis not present

## 2021-08-01 DIAGNOSIS — R110 Nausea: Secondary | ICD-10-CM | POA: Diagnosis not present

## 2021-08-01 DIAGNOSIS — R0683 Snoring: Secondary | ICD-10-CM

## 2021-08-01 DIAGNOSIS — G479 Sleep disorder, unspecified: Secondary | ICD-10-CM | POA: Diagnosis not present

## 2021-08-01 DIAGNOSIS — F5101 Primary insomnia: Secondary | ICD-10-CM | POA: Diagnosis not present

## 2021-08-01 DIAGNOSIS — F902 Attention-deficit hyperactivity disorder, combined type: Secondary | ICD-10-CM | POA: Diagnosis not present

## 2021-08-01 MED ORDER — BUTALBITAL-APAP-CAFFEINE 50-325-40 MG PO TABS
1.0000 | ORAL_TABLET | Freq: Four times a day (QID) | ORAL | 1 refills | Status: DC | PRN
Start: 2021-08-01 — End: 2021-08-31

## 2021-08-01 MED ORDER — ONDANSETRON HCL 4 MG PO TABS: 4.0000 mg | ORAL_TABLET | Freq: Three times a day (TID) | ORAL | 3 refills | Status: DC | PRN

## 2021-08-01 NOTE — Progress Notes (Signed)
Subjective:     Patient ID: Jill Hobbs, female   DOB: 1986-02-10, 35 y.o.   MRN: [id number]  This visit type was conducted due to national recommendations for restrictions regarding the COVID-19 Pandemic (e.g. social distancing) in an effort to limit this patient's exposure and mitigate transmission in our community.  Due to their co-morbid illnesses, this patient is at least at moderate risk for complications without adequate follow up.  This format is felt to be most appropriate for this patient at this time.    Documentation for virtual audio and video telecommunications through [place] encounter:  The patient was located at home. The provider was located in the office. The patient did consent to this visit and is aware of possible charges through their insurance for this visit.  The other persons participating in this telemedicine service were none. Time spent on call was 20 minutes and in review of previous records 20 minutes total.  This virtual service is not related to other E/M service within previous 7 days.   HPI Chief Complaint  Patient presents with   medication reevaluation    Medication reevalulation. Had 3 migraines last week. Her next dose is Thursday for aimvoig   Virtual consult f/u on migraines.  At her recent physical visit we started Aimovig preventative injection medication for migraines.  She continues on Fioricet.  She notes that she had absolutely no headache until about a week ago when she had 3 migraines in the past week.  Her next injection is due in a day or 2.  She wonders if the medicine was wearing off which made it more likely for her headaches to restart.  It was her first injection with Aimovig.  So far she has good hope for Aimovig as she normally gets more migraines per month prior to starting this trial.  So to only have migraines right before the next injection was due makes her feel that this could be a medicine that would really help.   Fioricet still works pretty well for abortive medication.  Prior preventative medications include Topamax and amitriptyline in the past but did not do well on either.  She also had side effects from Topamax.  She does not want to take a pill every day.  She has done Botox at one point, but not great improvements in migrains.   She gets nausea sometimes with migraines.      Sleep problems are about the same.  She finally has an appointment for sleep study but it is not until November  She also got a recent updated prescription on her eyeglasses from her eye doctor.  Review of Systems As in subjective     Objective:   Physical Exam Due to coronavirus pandemic stay at home measures, patient visit was virtual and they were not examined in person.   Wt 180 lb (81.6 kg)   LMP 07/03/2021   BMI 31.89 kg/m   Gen: nad     Assessment:     Encounter Diagnoses  Name Primary?   Migraine without aura and without status migrainosus, not intractable Yes   Nausea    Sleep disturbance    Snoring        Plan:     The Aimovig just got approved by insurance.  We have hopes that this will continue to be an effective medicine for preventing migraines.  Continue Aimovig.  Continue Fioricet as needed for flareups and abortive therapy.  Refilled Zofran for use for nausea  with migraines.  I asked her to call back within 1 to 2 months to let me know this is still working okay  Sleep disturbance-follow-up for sleep study as planned in November  Jill Hobbs was seen today for medication reevaluation.  Diagnoses and all orders for this visit:  Migraine without aura and without status migrainosus, not intractable  Nausea  Sleep disturbance  Snoring  Other orders -     ondansetron (ZOFRAN) 4 MG tablet; Take 1 tablet (4 mg total) by mouth every 8 (eight) hours as needed for nausea or vomiting. -     butalbital-acetaminophen-caffeine (FIORICET) 50-325-40 MG tablet; Take 1-2 tablets by mouth every 6  (six) hours as needed for headache.  F/u 1-2 months

## 2021-08-08 ENCOUNTER — Ambulatory Visit (INDEPENDENT_AMBULATORY_CARE_PROVIDER_SITE_OTHER): Payer: Medicaid Other | Admitting: Family Medicine

## 2021-08-08 ENCOUNTER — Other Ambulatory Visit: Payer: Self-pay

## 2021-08-08 ENCOUNTER — Encounter: Payer: Self-pay | Admitting: Family Medicine

## 2021-08-08 ENCOUNTER — Other Ambulatory Visit (INDEPENDENT_AMBULATORY_CARE_PROVIDER_SITE_OTHER): Payer: Medicaid Other

## 2021-08-08 VITALS — BP 110/70 | HR 102 | Temp 97.1°F | Wt 182.4 lb

## 2021-08-08 DIAGNOSIS — H6692 Otitis media, unspecified, left ear: Secondary | ICD-10-CM | POA: Diagnosis not present

## 2021-08-08 DIAGNOSIS — H9209 Otalgia, unspecified ear: Secondary | ICD-10-CM

## 2021-08-08 DIAGNOSIS — R0981 Nasal congestion: Secondary | ICD-10-CM

## 2021-08-08 LAB — POCT INFLUENZA A/B
Influenza A, POC: NEGATIVE
Influenza B, POC: NEGATIVE

## 2021-08-08 LAB — POC COVID19 BINAXNOW: SARS Coronavirus 2 Ag: NEGATIVE

## 2021-08-08 MED ORDER — AZITHROMYCIN 500 MG PO TABS: 500.0000 mg | ORAL_TABLET | Freq: Every day | ORAL | 0 refills | Status: DC

## 2021-08-08 NOTE — Progress Notes (Signed)
   Subjective:    Patient ID: Jill Hobbs, female    DOB: June 19, 1986, 35 y.o.   MRN: [id number]  HPI She states that on Sunday she developed nasal congestion continued on Monday and then developed right earache but no fever, chills, sore throat, cough or congestion.   Review of Systems     Objective:   Physical Exam Alert and in no distress. Tympanic membrane on the right is slightly dull and erythematous, left is normal canals are normal. Pharyngeal area is normal. Neck is supple without adenopathy or thyromegaly. Cardiac exam shows a regular sinus rhythm without murmurs or gallops. Lungs are clear to auscultation. COVID and flu test is negative.       Assessment & Plan:  Right otitis media - Plan: azithromycin (ZITHROMAX) 500 MG tablet Recommend Tylenol for aches and pains and call if no improvement after 1 week.

## 2021-08-12 DIAGNOSIS — Z419 Encounter for procedure for purposes other than remedying health state, unspecified: Secondary | ICD-10-CM | POA: Diagnosis not present

## 2021-08-18 NOTE — Telephone Encounter (Signed)
P.A. approved , called pharmacy, pt informed

## 2021-08-22 DIAGNOSIS — R635 Abnormal weight gain: Secondary | ICD-10-CM | POA: Diagnosis not present

## 2021-08-30 ENCOUNTER — Other Ambulatory Visit: Payer: Self-pay | Admitting: Medical

## 2021-09-04 ENCOUNTER — Emergency Department (HOSPITAL_BASED_OUTPATIENT_CLINIC_OR_DEPARTMENT_OTHER): Payer: Medicaid Other

## 2021-09-04 ENCOUNTER — Encounter (HOSPITAL_BASED_OUTPATIENT_CLINIC_OR_DEPARTMENT_OTHER): Payer: Self-pay

## 2021-09-04 ENCOUNTER — Emergency Department (HOSPITAL_BASED_OUTPATIENT_CLINIC_OR_DEPARTMENT_OTHER)
Admission: EM | Admit: 2021-09-04 | Discharge: 2021-09-05 | Disposition: A | Discharge status: Home / Self Care | Payer: Medicaid Other | Attending: Emergency Medicine | Admitting: Emergency Medicine

## 2021-09-04 ENCOUNTER — Other Ambulatory Visit: Payer: Self-pay

## 2021-09-04 DIAGNOSIS — R1011 Right upper quadrant pain: Secondary | ICD-10-CM | POA: Diagnosis not present

## 2021-09-04 DIAGNOSIS — R197 Diarrhea, unspecified: Secondary | ICD-10-CM | POA: Diagnosis not present

## 2021-09-04 DIAGNOSIS — R112 Nausea with vomiting, unspecified: Secondary | ICD-10-CM | POA: Diagnosis not present

## 2021-09-04 NOTE — ED Provider Notes (Signed)
[facility] Provider Note: Jill Dell, MD, FACEP  CSN: [id number] MRN: [id number] ARRIVAL: 09/04/21 at 1815 ROOM: DB015/DB015   CHIEF COMPLAINT  Abdominal Pain and Nausea   HISTORY OF PRESENT ILLNESS  09/04/21 11:37 PM Jill Hobbs is a 35 y.o. female with a 1 month history of abdominal pain.  The pain is located in her right upper quadrant and occurs primarily after eating.  It typically lasts about 2 hours after eating.  It is worsened over the past several days.  It has been associated with nausea and occasional vomiting and some diarrhea.  She has not had a fever.  She currently rates her pain as a 7 out of 10, worse with palpation or movement.  She states she does not currently need anything for pain or nausea.  She still has her gallbladder.   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   CT abdomen shows normal liver 2018   Migraine    Positive ANA (antinuclear antibody)    resolved when she was evaluated by Rheumatology   Vitamin D deficiency     Past Surgical History:  Procedure Laterality Date   WISDOM TOOTH EXTRACTION      Family History  Problem Relation Age of Onset   Thalassemia Mother    Thalassemia Brother    Heart disease Maternal Grandmother    Cancer Other        cervical in great grandmother   Stroke Neg Hx    Diabetes Neg Hx     Social History   Tobacco Use   Smoking status: Never   Smokeless tobacco: Never  Substance Use Topics   Alcohol use: Yes    Comment: social   Drug use: No    Prior to Admission medications   Medication Sig Start Date End Date Taking? Authorizing Provider  amphetamine-dextroamphetamine (ADDERALL) 30 MG tablet Take 1 tablet by mouth 2 (two) times daily. 08/01/21   [provider]  azithromycin (ZITHROMAX) 500 MG tablet Take 1 tablet (500 mg  total) by mouth daily. 08/08/21   Ronnald Nian, MD  butalbital-acetaminophen-caffeine (FIORICET) [phone] MG tablet Take 1-2 tablets by mouth every 6 (six) hours as needed for headache. 08/31/21   Tysinger, Kermit Balo, PA-C  Erenumab-aooe (AIMOVIG) 70 MG/ML SOAJ Inject 70 mg into the skin every 30 (thirty) days. 07/24/21   Tysinger, Kermit Balo, PA-C  levonorgestrel (MIRENA) 20 MCG/24HR IUD 1 each by Intrauterine route once.    [provider]  medroxyPROGESTERone (PROVERA) 10 MG tablet Take 10 mg by mouth daily. 07/05/21   [provider]  ondansetron (ZOFRAN) 4 MG tablet Take 1 tablet (4 mg total) by mouth every 8 (eight) hours as needed for nausea or vomiting. 08/01/21   Tysinger, Kermit Balo, PA-C    Allergies Percocet [oxycodone-acetaminophen] and Topamax [topiramate]   REVIEW OF SYSTEMS  Negative except as noted here or in the History of Present Illness.   PHYSICAL EXAMINATION  Initial Vital Signs Blood pressure 111/77, pulse 74, temperature 98.2 F (36.8 C), temperature source Oral, resp. rate 18, height 5\' 1"  (1.549 m), weight 79.4 kg, SpO2 100 %.  Examination General: Well-developed, well-nourished female in no acute distress; appearance consistent with age of record HENT: normocephalic; atraumatic Eyes: Normal appearance Neck: supple Heart: regular rate and rhythm Lungs: clear to auscultation  bilaterally Abdomen: soft; nondistended; right upper quadrant tenderness; bowel sounds present Extremities: No deformity; full range of motion; pulses normal Neurologic: Awake, alert and oriented; motor function intact in all extremities and symmetric; no facial droop Skin: Warm and dry Psychiatric: Normal mood and affect   RESULTS  Summary of this visit's results, reviewed and interpreted by myself:   EKG Interpretation  Date/Time:    Ventricular Rate:    PR Interval:    QRS Duration:   QT Interval:    QTC Calculation:   R Axis:     Text Interpretation:          Laboratory Studies: Results for orders placed or performed during the hospital encounter of 09/04/21 (from the past 24 hour(s))  CBC with Differential/Platelet     Status: Abnormal   Collection Time: 09/04/21 11:38 PM  Result Value Ref Range   WBC 4.7 4.0 - 10.5 K/uL   RBC 3.88 3.87 - 5.11 MIL/uL   Hemoglobin 8.4 (L) 12.0 - 15.0 g/dL   HCT 99.8 (L) 33.8 - 25.0 %   MCV 69.1 (L) 80.0 - 100.0 fL   MCH 21.6 (L) 26.0 - 34.0 pg   MCHC 31.3 30.0 - 36.0 g/dL   RDW 53.9 76.7 - 34.1 %   Platelets 246 150 - 400 K/uL   nRBC 0.0 0.0 - 0.2 %   Neutrophils Relative % 54 %   Neutro Abs 2.5 1.7 - 7.7 K/uL   Lymphocytes Relative 39 %   Lymphs Abs 1.9 0.7 - 4.0 K/uL   Monocytes Relative 6 %   Monocytes Absolute 0.3 0.1 - 1.0 K/uL   Eosinophils Relative 1 %   Eosinophils Absolute 0.0 0.0 - 0.5 K/uL   Basophils Relative 0 %   Basophils Absolute 0.0 0.0 - 0.1 K/uL   Immature Granulocytes 0 %   Abs Immature Granulocytes 0.01 0.00 - 0.07 K/uL  Comprehensive metabolic panel     Status: Abnormal   Collection Time: 09/04/21 11:38 PM  Result Value Ref Range   Sodium 140 135 - 145 mmol/L   Potassium 2.7 (LL) 3.5 - 5.1 mmol/L   Chloride 115 (H) 98 - 111 mmol/L   CO2 20 (L) 22 - 32 mmol/L   Glucose, Bld 70 70 - 99 mg/dL   BUN 6 6 - 20 mg/dL   Creatinine, Ser 9.37 (L) 0.44 - 1.00 mg/dL   Calcium 6.4 (LL) 8.9 - 10.3 mg/dL   Total Protein 5.2 (L) 6.5 - 8.1 g/dL   Albumin 3.1 (L) 3.5 - 5.0 g/dL   AST 15 15 - 41 U/L   ALT 19 0 - 44 U/L   Alkaline Phosphatase 25 (L) 38 - 126 U/L   Total Bilirubin 0.3 0.3 - 1.2 mg/dL   GFR, Estimated >90 >24 mL/min   Anion gap 5 5 - 15  Lipase, blood     Status: Abnormal   Collection Time: 09/04/21 11:38 PM  Result Value Ref Range   Lipase <10 (L) 11 - 51 U/L   Imaging Studies: US Abdomen Limited RUQ (LIVER/GB)  Result Date: 09/05/2021 CLINICAL DATA:  Right upper quadrant pain. EXAM: ULTRASOUND ABDOMEN LIMITED RIGHT UPPER QUADRANT COMPARISON:  None. FINDINGS:  Gallbladder: No gallstones or wall thickening visualized (3.7 mm). No sonographic Murphy sign noted by sonographer. Common bile duct: Diameter: 4.7 mm Liver: No focal lesion identified. Within normal limits in parenchymal echogenicity. Portal vein is patent on color Doppler imaging with normal direction of blood flow towards the liver. Other:  None. IMPRESSION: Normal right upper quadrant ultrasound. Electronically Signed   By: Aram Candela M.D.   On: 09/05/2021 00:26    ED COURSE and MDM  Nursing notes, initial and subsequent vitals signs, including pulse oximetry, reviewed and interpreted by myself.  Vitals:   09/04/21 2329 09/04/21 2330 09/05/21 0046 09/05/21 0210  BP: 111/77 111/77 115/74 102/78  Pulse: 74 71 70 68  Resp: 18 20 18 18   Temp:    98.2 F (36.8 C)  TempSrc:    Oral  SpO2: 100% 100% 100% 98%  Weight:      Height:       Medications  potassium chloride SA (KLOR-CON) CR tablet 40 mEq (has no administration in time range)   2:19 AM On recheck, without supplementation given, potassium was 3.4 and calcium is 9.0.  I suspect the first draw was diluted.  The patient's symptoms are consistent with biliary colic but no cholelithiasis was seen on ultrasound.  She was advised to contact her PCP regarding follow-up HIDA scan as she may have a malfunctioning gallbladder.   PROCEDURES  Procedures   ED DIAGNOSES     ICD-10-CM   1. RUQ abdominal pain  R10.11 Abdomen Limited RUQ (LIVER/GB)    US Abdomen Limited RUQ (LIVER/GB)         Reaghan Kawa, MD 09/05/21 0221

## 2021-09-04 NOTE — ED Triage Notes (Signed)
Pt  reports RUQ pain and nausea x a month. States she feels sharp on her side whenever she eats and last for couple of hrs.

## 2021-09-05 DIAGNOSIS — R1011 Right upper quadrant pain: Secondary | ICD-10-CM | POA: Diagnosis not present

## 2021-09-05 LAB — COMPREHENSIVE METABOLIC PANEL
ALT: 19 U/L (ref 0–44)
AST: 15 U/L (ref 15–41)
Albumin: 3.1 g/dL — ABNORMAL LOW (ref 3.5–5.0)
Alkaline Phosphatase: 25 U/L — ABNORMAL LOW (ref 38–126)
Anion gap: 7 (ref 5–15)
BUN: 6 mg/dL (ref 6–20)
CO2: 27 mmol/L (ref 22–32)
Calcium: 9 mg/dL (ref 8.9–10.3)
Chloride: 106 mmol/L (ref 98–111)
Creatinine, Ser: 0.64 mg/dL (ref 0.44–1.00)
GFR, Estimated: >60 mL/min (ref >60)
Glucose, Bld: 91 mg/dL (ref 70–99)
Potassium: 3.4 mmol/L — ABNORMAL LOW (ref 3.5–5.1)
Sodium: 140 mmol/L (ref 135–145)
Total Bilirubin: 0.3 mg/dL (ref 0.3–1.2)
Total Protein: 5.2 g/dL — ABNORMAL LOW (ref 6.5–8.1)

## 2021-09-05 LAB — CBC WITH DIFFERENTIAL/PLATELET
Abs Immature Granulocytes: 0.01 10*3/uL (ref 0.00–0.07)
Basophils Absolute: 0 10*3/uL (ref 0.0–0.1)
Basophils Relative: 0 %
Eosinophils Absolute: 0 10*3/uL (ref 0.0–0.5)
Eosinophils Relative: 1 %
HCT: 26.8 % — ABNORMAL LOW (ref 36.0–46.0)
Hemoglobin: 8.4 g/dL — ABNORMAL LOW (ref 12.0–15.0)
Immature Granulocytes: 0 %
Lymphocytes Relative: 39 %
Lymphs Abs: 1.9 10*3/uL (ref 0.7–4.0)
MCH: 21.6 pg — ABNORMAL LOW (ref 26.0–34.0)
MCHC: 31.3 g/dL (ref 30.0–36.0)
MCV: 69.1 fL — ABNORMAL LOW (ref 80.0–100.0)
Monocytes Absolute: 0.3 10*3/uL (ref 0.1–1.0)
Monocytes Relative: 6 %
Neutro Abs: 2.5 10*3/uL (ref 1.7–7.7)
Neutrophils Relative %: 54 %
Platelets: 246 10*3/uL (ref 150–400)
RBC: 3.88 MIL/uL (ref 3.87–5.11)
RDW: 14.8 % (ref 11.5–15.5)
WBC: 4.7 10*3/uL (ref 4.0–10.5)
nRBC: 0 % (ref 0.0–0.2)

## 2021-09-05 LAB — BASIC METABOLIC PANEL
Anion gap: 6 (ref 5–15)
BUN: 7 mg/dL (ref 6–20)
CO2: 27 mmol/L (ref 22–32)
Calcium: 9 mg/dL (ref 8.9–10.3)
Chloride: 106 mmol/L (ref 98–111)
Creatinine, Ser: 0.64 mg/dL (ref 0.44–1.00)
GFR, Estimated: >60 mL/min (ref >60)
Glucose, Bld: 91 mg/dL (ref 70–99)
Potassium: 3.4 mmol/L — ABNORMAL LOW (ref 3.5–5.1)
Sodium: 139 mmol/L (ref 135–145)

## 2021-09-05 LAB — LIPASE, BLOOD: Lipase: <10 U/L — ABNORMAL LOW (ref 11–51)

## 2021-09-05 MED ORDER — POTASSIUM CHLORIDE 10 MEQ/100ML IV SOLN: 10.0000 meq | INTRAVENOUS | Status: DC

## 2021-09-05 MED ORDER — POTASSIUM CHLORIDE CRYS ER 20 MEQ PO TBCR: 40.0000 meq | EXTENDED_RELEASE_TABLET | Freq: Once | ORAL | Status: DC

## 2021-09-05 MED ORDER — CALCIUM GLUCONATE-NACL 1-0.675 GM/50ML-% IV SOLN: 1.0000 g | Freq: Once | INTRAVENOUS | Status: DC

## 2021-09-05 MED ORDER — CALCIUM GLUCONATE 10 % IV SOLN
1.0000 g | Freq: Once | INTRAVENOUS | Status: DC
Start: 2021-09-05 — End: 2021-09-05

## 2021-09-06 ENCOUNTER — Other Ambulatory Visit: Payer: Self-pay

## 2021-09-06 ENCOUNTER — Ambulatory Visit (INDEPENDENT_AMBULATORY_CARE_PROVIDER_SITE_OTHER): Payer: Medicaid Other | Admitting: Medical

## 2021-09-06 VITALS — BP 118/70 | HR 88 | Temp 97.9°F | Wt 180.8 lb

## 2021-09-06 DIAGNOSIS — R195 Other fecal abnormalities: Secondary | ICD-10-CM

## 2021-09-06 DIAGNOSIS — R110 Nausea: Secondary | ICD-10-CM | POA: Diagnosis not present

## 2021-09-06 DIAGNOSIS — R109 Unspecified abdominal pain: Secondary | ICD-10-CM

## 2021-09-06 DIAGNOSIS — D649 Anemia, unspecified: Secondary | ICD-10-CM

## 2021-09-06 MED ORDER — OMEPRAZOLE 40 MG PO CPDR: 40.0000 mg | DELAYED_RELEASE_CAPSULE | Freq: Every day | ORAL | 3 refills | Status: DC

## 2021-09-06 MED ORDER — SUCRALFATE 1 G PO TABS: 1.0000 g | ORAL_TABLET | Freq: Three times a day (TID) | ORAL | 0 refills | Status: DC

## 2021-09-06 NOTE — Progress Notes (Signed)
Subjective:  Jill Hobbs is a 35 y.o. female who presents for Chief Complaint  Patient presents with   ER follow-up    ER follow-up on stomach pain. Still having pain. Wants blood work looked out that was done recently too. Declines flu shot today     Here for ED follow up   been experience abdominal pain.  Sometimes worse after eating for a few hours.  Has nausea for hours.  Having some diarrhea.   Started a month ago.  Had worse pain on Monday.   No fever.  No urinary c/o, no burning, no frequency, no blood, no urgency.   Loose stools sometimes 3-4 x/day, sometimes none.  No blood in stool, no mucous in stool.  No black stool, but can be dark green.  Pain tends to be right upper abdomen.   Using sometimes Zofran for nausea, sometimes pepto bismol.   Pepto helped initially.  No other aggravating or relieving factors.  No other c/o.  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   CT abdomen shows normal liver 2018   Migraine    Positive ANA (antinuclear antibody)    resolved when she was evaluated by Rheumatology   Vitamin D deficiency    Current Outpatient Medications on File Prior to Visit  Medication Sig Dispense Refill   amphetamine-dextroamphetamine (ADDERALL) 30 MG tablet Take 1 tablet by mouth 2 (two) times daily.     butalbital-acetaminophen-caffeine (FIORICET) 50-325-40 MG tablet Take 1-2 tablets by mouth every 6 (six) hours as needed for headache. 20 tablet 1   Erenumab-aooe (AIMOVIG) 70 MG/ML SOAJ Inject 70 mg into the skin every 30 (thirty) days. 1.12 mL 2   levonorgestrel (MIRENA) 20 MCG/24HR IUD 1 each by Intrauterine route once.     ondansetron (ZOFRAN) 4 MG tablet Take 1 tablet (4 mg total) by mouth every 8 (eight) hours as needed for nausea or vomiting. 20 tablet 3   No current facility-administered medications on file  prior to visit.   Past Surgical History:  Procedure Laterality Date   WISDOM TOOTH EXTRACTION      The following portions of the patient's history were reviewed and updated as appropriate: allergies, current medications, past family history, past medical history, past social history, past surgical history and problem list.  ROS Otherwise as in subjective above   Objective: BP 118/70   Pulse 88   Temp 97.9 F (36.6 C)   Wt 180 lb 12.8 oz (82 kg)   BMI 34.16 kg/m   General appearance: alert, no distress, well developed, well nourished Heart: RRR, normal S1, S2, no murmurs Lungs: CTA bilaterally, no wheezes, rhonchi, or rales Abdomen: +bs, soft, tender RUQ and epigastric region, otherwise non tender, non distended, no masses, no hepatomegaly, no splenomegaly Pulses: 2+ radial pulses, 2+ pedal pulses, normal cap refill Ext: no edema   Assessment: Encounter Diagnoses  Name Primary?   Anemia, unspecified type Yes   Abdominal pain, unspecified abdominal location    Nausea    Loose stools      Plan: I reviewed her recent emergency department notes and labs and ultrasound from this week.  Ultrasound of abdomen was normal.  Labs showed a new acute drop in hemoglobin from 11 down to 8 over the last 2 months.  Otherwise labs were fairly unremarkable.  Mildly  low potassium  We discussed possible differential which could include gallbladder, ulcer, or other process  Additional labs as below  Begin medication as below as well as Pepto-Bismol twice daily.  We discussed the proper timing of omeprazole and sucralfate and Pepto-Bismol.  Avoid NSAIDs.  Avoid spicy foods and hot sauce.  We will get her set up for a nuclear HIDA scan  Will likely need to start iron and will likely need to get gastroenterology involved  Jill Hobbs was seen today for er follow-up.  Diagnoses and all orders for this visit:  Anemia, unspecified type -     Iron, TIBC and Ferritin Panel -     Vitamin  B12 -     Folate -     NM Hepato W/Eject Fract; Future -     Reticulocytes  Abdominal pain, unspecified abdominal location -     NM Hepato W/Eject Fract; Future  Nausea -     NM Hepato W/Eject Fract; Future  Loose stools -     NM Hepato W/Eject Fract; Future  Other orders -     omeprazole (PRILOSEC) 40 MG capsule; Take 1 capsule (40 mg total) by mouth daily. -     sucralfate (CARAFATE) 1 g tablet; Take 1 tablet (1 g total) by mouth 4 (four) times daily -  with meals and at bedtime.   Follow up: pending labs

## 2021-09-07 ENCOUNTER — Other Ambulatory Visit: Payer: Self-pay | Admitting: Medical

## 2021-09-07 DIAGNOSIS — D649 Anemia, unspecified: Secondary | ICD-10-CM

## 2021-09-07 LAB — RETICULOCYTES: Retic Ct Pct: 1.2 % (ref 0.6–2.6)

## 2021-09-07 LAB — VITAMIN B12: Vitamin B-12: 387 pg/mL (ref 232–1245)

## 2021-09-07 LAB — IRON,TIBC AND FERRITIN PANEL
Ferritin: 382 ng/mL — ABNORMAL HIGH (ref 15–150)
Iron Saturation: 19 % (ref 15–55)
Iron: 45 ug/dL (ref 27–159)
Total Iron Binding Capacity: 237 ug/dL — ABNORMAL LOW (ref 250–450)
UIBC: 192 ug/dL (ref 131–425)

## 2021-09-07 LAB — FOLATE: Folate: 5.7 ng/mL (ref >3.0)

## 2021-09-11 ENCOUNTER — Telehealth: Payer: Self-pay | Admitting: Medical

## 2021-09-11 NOTE — Telephone Encounter (Signed)
HIDA  Scan

## 2021-09-12 DIAGNOSIS — Z419 Encounter for procedure for purposes other than remedying health state, unspecified: Secondary | ICD-10-CM | POA: Diagnosis not present

## 2021-09-13 DIAGNOSIS — K219 Gastro-esophageal reflux disease without esophagitis: Secondary | ICD-10-CM | POA: Diagnosis not present

## 2021-09-13 DIAGNOSIS — Z724 Inappropriate diet and eating habits: Secondary | ICD-10-CM | POA: Diagnosis not present

## 2021-09-13 DIAGNOSIS — E669 Obesity, unspecified: Secondary | ICD-10-CM | POA: Diagnosis not present

## 2021-09-13 DIAGNOSIS — E7800 Pure hypercholesterolemia, unspecified: Secondary | ICD-10-CM | POA: Diagnosis not present

## 2021-09-13 DIAGNOSIS — D561 Beta thalassemia: Secondary | ICD-10-CM | POA: Diagnosis not present

## 2021-09-14 ENCOUNTER — Ambulatory Visit: Payer: Medicaid Other | Admitting: Neurology

## 2021-09-14 ENCOUNTER — Encounter: Payer: Self-pay | Admitting: Neurology

## 2021-09-14 ENCOUNTER — Other Ambulatory Visit: Payer: Self-pay

## 2021-09-14 VITALS — BP 100/68 | HR 97 | Ht 61.0 in | Wt 182.0 lb

## 2021-09-14 DIAGNOSIS — E6609 Other obesity due to excess calories: Secondary | ICD-10-CM

## 2021-09-14 DIAGNOSIS — Z6834 Body mass index (BMI) 34.0-34.9, adult: Secondary | ICD-10-CM | POA: Diagnosis not present

## 2021-09-14 DIAGNOSIS — R0683 Snoring: Secondary | ICD-10-CM | POA: Diagnosis not present

## 2021-09-14 DIAGNOSIS — D561 Beta thalassemia: Secondary | ICD-10-CM | POA: Diagnosis not present

## 2021-09-14 DIAGNOSIS — G43911 Migraine, unspecified, intractable, with status migrainosus: Secondary | ICD-10-CM

## 2021-09-14 DIAGNOSIS — G44011 Episodic cluster headache, intractable: Secondary | ICD-10-CM

## 2021-09-14 NOTE — Progress Notes (Signed)
SLEEP MEDICINE CLINIC    Provider:  Melvyn Novas, MD  Primary Care Physician:  Jac Canavan, PA-C [address] [place] [address]     Referring Provider: Jac Canavan, Pa-c [address] [place],  [place] [address]          Chief Complaint according to patient   Patient presents with:     New Patient (Initial Visit)     alone. Internal referral for trouble falling and staying asleep and not rested upon awakening. Waking up 2x a night. Pt reports mild snoring. Pt has taken melatonin in the past, unsure of strength. Ongoing for years. No prior SS.       HISTORY OF PRESENT ILLNESS:  Jill Hobbs is a 35 y.o. year old Black or [place] American female patient seen here in a consultation requested by her PCP on 09/14/2021   Chief concern according to patient :  " I am unable to sleep  through the night, some nights I have trouble to go to sleep, and I still wake up frequently after using a sleep aid , OTC melatonin, Tried trazodone" . I have a harder time sleeping with a migraine, and have been woken by migraine- a cluster HA, sharp, stabbing through eye or temple". Aimovig has improved frequency of all headaches.  Her sleep began to deteriorate 4 years ago, while she was on cal 24/7 working and supervising several treatment centers at once.  She felt she had to take work home. She was much more anxious and insomnia resulted out of stress.     Jill Hobbs has a past medical history of Beta thalassemia (HCC), LFT elevation, Migraine, Positive ANA (antinuclear antibody), and Vitamin D deficiency.    Sleep relevant medical history: snoring, witnessed by her children, feel like choking with a dry mouth , NO ENT surgery, no thyroid disease, no neck surgery or trauma.    Family medical /sleep history: No other family member on CPAP with OSA.   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  ETOH use socially, 1 a month or less,  Caffeine in take none- but 2  energy drinks a week, red bull. Regular exercise- not yet. Just started seeing the weight and wellness clinic.         Sleep habits are as follows: The patient's dinner time is between 5-6 PM. The patient goes to bed at 9 PM and continues to sleep for 1-2 hours, wakes  3-4 times each night , not for bathroom breaks. Cool , and dark and quiet bedroom.  The preferred sleep position is prone or back  , with the support of 1 pillow.  Dreams are reportedly rare. 3.30  AM is the usual rise time. The patient wakes up spontaneously before with an alarm.  She reports not feeling refreshed or restored in AM, with symptoms such as dry mouth, morning headaches, night time headaches, no other pain but residual fatigue.  She feels exhausted. Naps are taken infrequently, she is not always able to n go to sleep- if naps are taken they  are lasting from 30 to 45 minutes and are less refreshing than nocturnal sleep.    Review of Systems: Out of a complete 14 system review, the patient complains of only the following  symptoms, and all other reviewed systems are negative.:  Fatigue, sleepiness , snoring, fragmented sleep, Insomnia !!   How likely are you to doze in the following situations: 0 = not likely, 1 = slight chance, 2 = moderate chance, 3 = high chance   Sitting and Reading? Watching Television? Sitting inactive in a public place (theater or meeting)? As a passenger in a car for an hour without a break? Lying down in the afternoon when circumstances permit? Sitting and talking to someone? Sitting quietly after lunch without alcohol? In a car, while stopped for a few minutes in traffic?   Total = 5/ 24 points   FSS endorsed at 47/ 63 points.   Depression: denied  Anxiety ; some  Social History   Socioeconomic  History   Marital status: Single    Spouse name: Not on file   Number of children: 2   Years of education: Not on file   Highest education level: Associate degree: occupational, Scientist, product/process development, or vocational program  Occupational History    Comment: in school for human resource  Tobacco Use   Smoking status: Never   Smokeless tobacco: Never  Substance and Sexual Activity   Alcohol use: Yes    Comment: social   Drug use: No   Sexual activity: Yes    Partners: Male    Birth control/protection: I.U.D.  Other Topics Concern   Not on file  Social History Narrative   Next month starts at a different substance abuse treatment center.   Was Tree surgeon at prior treatment facility.  Has degree in counseling.   Exercise - nothing.  06/2021   Right handed   Lives with her 2 sons   Caffeine: 2 energy drinks a week   Social Determinants of Health   Financial Resource Strain: Not on file  Food Insecurity: Not on file  Transportation Needs: Not on file  Physical Activity: Not on file  Stress: Not on file  Social Connections: Not on file    Family History  Problem Relation Age of Onset   Thalassemia Mother    Thalassemia Brother    Heart disease Maternal Grandmother    Cancer Other        cervical in great grandmother   Stroke Neg Hx    Diabetes Neg Hx     Past Medical History:  Diagnosis Date   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   CT abdomen shows normal liver 2018   Migraine    Positive ANA (antinuclear antibody)    resolved when she was evaluated by Rheumatology   Vitamin D deficiency     Past Surgical History:  Procedure Laterality Date   WISDOM TOOTH EXTRACTION       Current Outpatient Medications on File Prior to Visit  Medication Sig Dispense Refill   amphetamine-dextroamphetamine (ADDERALL) 30 MG tablet Take 1 tablet by mouth 2 (two) times daily.      butalbital-acetaminophen-caffeine (FIORICET) 50-325-40 MG tablet Take 1-2 tablets by mouth every 6 (six) hours as needed for headache. 20 tablet 1   Erenumab-aooe (AIMOVIG) 70 MG/ML SOAJ Inject 70 mg into the skin every 30 (thirty) days. 1.12 mL 2   levonorgestrel (MIRENA) 20 MCG/24HR IUD 1 each by Intrauterine route once.     omeprazole (PRILOSEC) 40 MG capsule Take 1 capsule (40 mg total) by mouth daily. 30 capsule 3  ondansetron (ZOFRAN) 4 MG tablet Take 1 tablet (4 mg total) by mouth every 8 (eight) hours as needed for nausea or vomiting. 20 tablet 3   sucralfate (CARAFATE) 1 g tablet Take 1 tablet (1 g total) by mouth 4 (four) times daily -  with meals and at bedtime. 30 tablet 0   No current facility-administered medications on file prior to visit.    Allergies  Allergen Reactions   Percocet [Oxycodone-Acetaminophen] Rash   Topamax [Topiramate] Rash and Other (See Comments)    Blood in stool    Physical exam:  Today's Vitals   09/14/21 1446  BP: 100/68  Pulse: 97  SpO2: 99%  Weight: 182 lb (82.6 kg)  Height: 5\' 1"  (1.549 m)   Body mass index is 34.39 kg/m.   Wt Readings from Last 3 Encounters:  09/14/21 182 lb (82.6 kg)  09/06/21 180 lb 12.8 oz (82 kg)  09/04/21 175 lb (79.4 kg)     Ht Readings from Last 3 Encounters:  09/14/21 5\' 1"  (1.549 m)  09/04/21 5\' 1"  (1.549 m)  07/03/21 5\' 3"  (1.6 m)      General: The patient is awake, alert and appears not in acute distress. The patient is well groomed. Head: Normocephalic, atraumatic. Neck is supple. Mallampati 3,  neck circumference:14 inches . Nasal airflow patent.  Retrognathia is  seen.  Dental status: biological teeth. No braces or retainers.  Cardiovascular:  Regular rate and cardiac rhythm by pulse,  without distended neck veins. Respiratory: Lungs are clear to auscultation.  Skin:  Without evidence of ankle edema, or rash. Trunk: The patient's posture is erect.   Neurologic exam : The patient is awake  and alert, oriented to place and time.   Memory subjective described as intact.  Attention span & concentration ability appears normal.  Speech is fluent,  without  dysarthria, dysphonia or aphasia.  Mood and affect are appropriate.   Cranial nerves: no loss of smell or taste reported  Pupils are equal and briskly reactive to light. Funduscopic exam deferred.  Extraocular movements in vertical and horizontal planes were intact and without nystagmus. No Diplopia. Visual fields by finger perimetry are intact. Hearing was intact to soft voice and finger rubbing.    Facial sensation intact to fine touch.  Facial motor strength is symmetric and tongue and uvula move midline.  Neck ROM : rotation, tilt and flexion extension were normal for age and shoulder shrug was symmetrical.    Motor exam:  Symmetric bulk, tone and ROM.   Normal tone without cog -wheeling, symmetric grip strength .   Sensory:  Fine touch and vibration were normal.  Proprioception tested in the upper extremities was normal.   Coordination: Rapid alternating movements in the fingers/hands were of normal speed.  The Finger-to-nose maneuver was intact without evidence of ataxia, dysmetria or tremor.   Gait and station: Patient could rise unassisted from a seated position, walked without assistive device.  Stance is of normal width/ base and the patient turned with 3 steps.  Toe and heel walk were deferred.  Deep tendon reflexes: in the  upper and lower extremities are symmetric and intact.  Babinski response was deferred.      Component Ref Range & Units 8 d ago  (09/06/21) 2 mo ago  (07/03/21) 6 yr ago  (10/19/14) 9 yr ago  (01/31/12) 9 yr ago  (01/31/12)  Total Iron Binding Capacity 250 - 450 ug/dL 07/05/21 Low   14/8/15   02/02/12 R  UIBC 131 - 425 ug/dL 751  025   852 R    Iron 27 - 159 ug/dL 45  64   51 R    Iron Saturation 15 - 55 % 19  25      Ferritin 15 - 150 ng/mL 382 High   389 High   252 R   184 R   Resulting Agency   [facility]          Component Ref Range & Units 9 d ago  (09/05/21) 10 d ago  (09/04/21) 2 mo ago  (07/03/21) 3 yr ago  (09/27/17) 4 yr ago  (04/23/17) 6 yr ago  (03/26/15) 6 yr ago  (10/19/14)  Sodium 135 - 145 mmol/L 139  140  140 R  138  137  137  137 R   Potassium 3.5 - 5.1 mmol/L 3.4 Low   3.4 Low  VC, CM  4.4 R  3.4 Low   3.3 Low   3.7  3.9 R   Chloride 98 - 111 mmol/L 106  106 VC, CM  106 R  106 R  105 R  104 R  109 R   CO2 22 - 32 mmol/L 27  27 VC, CM  19 Low  R  25  26  25  22  R   Glucose, Bld 70 - 99 mg/dL 91  91 VC, CM  88 R  High  R  88 R  79 R  86   Comment: Glucose reference range applies only to samples taken after fasting for at least 8 hours.  BUN 6 - 20 mg/dL 7  6  10  12  7  7  13  R   Creatinine, Ser 0.44 - 1.00 mg/dL 778  VC, CM  2.42 R  0.82  0.65  0.78  0.77 R   Calcium 8.9 - 10.3 mg/dL 9.0  9.0 VC, CM  9.7 R  9.2  9.0  8.8 Low   9.3 R   GFR, Estimated >60 mL/min >60  >60 CM        Comment: (NOTE)  Calculated using the CKD-EPI Creatinine Equation (2021)   Anion gap 5 - 15 6  7  VC, CM   7  6  8     Comment: Performed at [facility], [address] [place], [facility]  Resulting Agency  [facility]         Specimen Collected: 09/05/21 00:59 Last Resulted: 09/05/21 02:30      Lab Flowsheet    Order Details    View Encounter    Lab and Collection Details    Routing    Result History    View Encounter Conversation      VC=Value has a corrected status   CM=Additional comments  R=Reference range differs from displayed range      Result Care Coordination   Patient Communication   Add Comments   Seen Back to Top       Other Results from 09/04/2021   Contains abnormal data CBC with Differential/Platelet Order: [id number] Status: Final result   Visible to patient: Yes (seen)   Next appt: 09/27/2021 at 09:00 AM in Radiology ([facility])   0 Result  Notes Component Ref Range & Units 10 d ago 2 mo ago 3 yr ago 4 yr ago 6 yr ago 7 yr ago 9 yr ago  WBC 4.0 - 10.5 K/uL 4.7  5.4 R  6.8  6.3  5.9  5.4  3.7 Low  R   RBC 3.87 - 5.11 MIL/uL 3.88  5.43 High  R  5.28 High   5.15 High   5.06  5.46 High   5.46 High  R   Hemoglobin 12.0 - 15.0 g/dL 8.4 Low   16.1 R  09.6 Low   11.2 Low   10.9 Low   11.9 Low   11.8 R   Comment: Reticulocyte Hemoglobin testing  may be clinically indicated,  consider ordering this additional  test [id number]   HCT 36.0 - 46.0 % 26.8 Low   37.9 R  36.9  35.6 Low   34.2 Low   36.2  36.4 R   MCV 80.0 - 100.0 fL 69.1 Low   70 Low  R  69.9 Low  R  69.1 Low  R  67.6 Low  R  66.3 Low  R  66.7 Low  R   MCH 26.0 - 34.0 pg 21.6 Low   21.7 Low  R  21.6 Low   21.7 Low   21.5 Low   21.8 Low   21.6 Low  R   MCHC 30.0 - 36.0 g/dL 19.1  47.8 Low  R  29.5  31.5  31.9  32.9  32.4 R   RDW 11.5 - 15.5 % 14.8  15.0 R  14.7  14.7  14.7  14.6  14.6 High  R   Platelets 150 - 400 K/uL 246  346 R  346  342  286  332  288 R   nRBC 0.0 - 0.2 % 0.0       0 R   Neutrophils Relative % % 54  67 R  69  64  78 High  R     Neutro Abs 1.7 - 7.7 K/uL 2.5  3.6 R  4.7  4.0  4.6   2.2 R   Lymphocytes Relative % 39   R     Lymphs Abs 0.7 - 4.0 K/uL 1.9  1.4 R  1.8  1.7  1.0     Monocytes Relative % R     Monocytes Absolute 0.1 - 1.0 K/uL 0.3   0.3  0.4  0.3     Eosinophils Relative % 0 R     Eosinophils Absolute 0.0 - 0.5 K/uL 0.0   0.1 R  0.2 R  0.0 R   0.0 R   Basophils Relative % 0   0  0  0 R     Basophils Absolute 0.0 - 0.1 K/uL 0.0  0.0 R  0.0  0.0  0.0   0.0 R   Immature Granulocytes % 0  0 R        Abs Immature Granulocytes 0.00 - 0.07 K/uL 0.01         Comment: Performed at [facility], [address], [place], [place] [address]  Lymphs   25 R        lymph#             Please note that the patent was just in the ED, after her referral was sent to [place]; "Here for ED follow up   been experience  abdominal pain.  Sometimes worse after eating for a few hours.  Has nausea for hours.  Having some diarrhea.   Started a month ago.  Had worse pain on Monday.   No fever.  No urinary c/o, no burning, no frequency, no blood, no urgency.   Loose stools sometimes 3-4 x/day, sometimes none.  No blood in stool, no mucous in stool.  No black stool, but can be dark green.  Pain tends to be right upper abdomen.   Using sometimes Zofran for nausea, sometimes pepto bismol.   Pepto helped initially.  No other aggravating or relieving factors.  No other c/o."  Past Medical History:  Diagnosis Date   Beta thalassemia (HCC)    presumed beta thal minor; never needed pRBC transfusion   LFT elevation    After spending a total time of  45  minutes: this includes time in  face to face interview and and additional time for physical and neurologic examination, review of laboratory studies,  personal review of imaging studies, reports and results of other testing and review of referral information / records as far as provided in visit, I have established the following assessments:  1) Mrs. 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  Even that she has been able to rearrange her work assignments she was left with a feeling of anxiety and hypervigilance and it has made it difficult for her to initiate sleep and maintain sleep.  She does not report excessive daytime sleepiness but fatigue.  Fatigue also is certainly more commonly seen in patients with certain vitamin deficiencies in patients with anemia or thyroid disease.  As I understand anemia is present but it is not of iron deficiency but due to a hemolytic anemia.   Certainly she is trying to allocate enough time to sleep with her very early rise time at 330 she should be in bed 7 hours early am sure this is cutting time of  her social interaction with her children .   There are several headache types present, migraine and cluster, morning headaches, these can be affected by OSA and hypoxia.   She is snoring and her children have told her so that she has never been told that she actually stops breathing.  On sometimes she feels like she may choke and has a very dry mouth in the morning.  So my goal for this patient was high-grade Mallampati elevated BMI retrognathia and history of snoring is to check her and screen her for sleep apnea.  For this we can use a home sleep test device or an in lab sleep study and I will leave it up to her insurer to approve either study.  I am glad to hear that she is diet weight and wellness clinic enrolled I think that will help with some of the risk factor modification.      My Plan is to proceed with:  1) HST or PSG SPLIT at [facility] 20.     I would like to thank Tysinger, Kermit Balo, PA-C and Tysinger, Kermit Balo, Pa-c [address] [place],  [place] [address] for allowing me to meet with and to take care of this pleasant patient.    CC: I will share my notes with PCP. Marland Kitchen  Electronically signed by: Melvyn Novas, MD 09/14/2021 3:30 PM  [facility] Board certified by The [employer] of Sleep Medicine and Diplomate of the [employer]  Academy of Sleep Medicine. Board certified In Neurology through the [place], Fellow of the [employer] of Neurology. Medical Director of [employer].

## 2021-09-22 ENCOUNTER — Ambulatory Visit (HOSPITAL_COMMUNITY): Payer: No Typology Code available for payment source

## 2021-09-27 ENCOUNTER — Ambulatory Visit (HOSPITAL_COMMUNITY)
Admission: RE | Admit: 2021-09-27 | Discharge: 2021-09-27 | Disposition: A | Discharge status: Home / Self Care | Payer: Medicaid Other | Source: Ambulatory Visit | Attending: Medical | Admitting: Medical

## 2021-09-27 ENCOUNTER — Other Ambulatory Visit: Payer: Self-pay

## 2021-09-27 DIAGNOSIS — R932 Abnormal findings on diagnostic imaging of liver and biliary tract: Secondary | ICD-10-CM | POA: Diagnosis not present

## 2021-09-27 DIAGNOSIS — R195 Other fecal abnormalities: Secondary | ICD-10-CM | POA: Diagnosis not present

## 2021-09-27 DIAGNOSIS — R109 Unspecified abdominal pain: Secondary | ICD-10-CM | POA: Diagnosis not present

## 2021-09-27 DIAGNOSIS — R1010 Upper abdominal pain, unspecified: Secondary | ICD-10-CM | POA: Diagnosis not present

## 2021-09-27 DIAGNOSIS — R110 Nausea: Secondary | ICD-10-CM | POA: Diagnosis not present

## 2021-09-27 DIAGNOSIS — D649 Anemia, unspecified: Secondary | ICD-10-CM | POA: Insufficient documentation

## 2021-09-27 MED ORDER — TECHNETIUM TC 99M MEBROFENIN IV KIT: 5.4000 | PACK | Freq: Once | INTRAVENOUS | Status: DC | PRN

## 2021-10-02 ENCOUNTER — Telehealth: Payer: Medicaid Other | Admitting: Medical

## 2021-10-03 ENCOUNTER — Encounter: Payer: Self-pay | Admitting: Medical

## 2021-10-03 ENCOUNTER — Other Ambulatory Visit: Payer: Self-pay

## 2021-10-03 ENCOUNTER — Telehealth (INDEPENDENT_AMBULATORY_CARE_PROVIDER_SITE_OTHER): Payer: Medicaid Other | Admitting: Medical

## 2021-10-03 VITALS — Wt 184.0 lb

## 2021-10-03 DIAGNOSIS — R1011 Right upper quadrant pain: Secondary | ICD-10-CM | POA: Diagnosis not present

## 2021-10-03 DIAGNOSIS — D649 Anemia, unspecified: Secondary | ICD-10-CM

## 2021-10-03 DIAGNOSIS — D561 Beta thalassemia: Secondary | ICD-10-CM | POA: Diagnosis not present

## 2021-10-03 DIAGNOSIS — R948 Abnormal results of function studies of other organs and systems: Secondary | ICD-10-CM

## 2021-10-03 DIAGNOSIS — R110 Nausea: Secondary | ICD-10-CM

## 2021-10-03 NOTE — Progress Notes (Signed)
Subjective:     Patient ID: Jill Hobbs, female   DOB: Jul 20, 1986, 35 y.o.   MRN: [id number]  This visit type was conducted due to national recommendations for restrictions regarding the COVID-19 Pandemic (e.g. social distancing) in an effort to limit this patient's exposure and mitigate transmission in our community.  Due to their co-morbid illnesses, this patient is at least at moderate risk for complications without adequate follow up.  This format is felt to be most appropriate for this patient at this time.    Documentation for virtual audio and video telecommunications through [facility] encounter:  The patient was located at home. The provider was located in the office. The patient did consent to this visit and is aware of possible charges through their insurance for this visit.  The other persons participating in this telemedicine service were none. Time spent on call was 20 minutes and in review of previous records 20 minutes total.  This virtual service is not related to other E/M service within previous 7 days.   HPI Chief Complaint  Patient presents with   Abdominal Pain    Go over imaging studies from 09/27/21   Virtual consult for review of recent studies.  In recent weeks she is continue to have belly pains.  She does endorse pain after eating within 15 to 30 minutes of eating.  This happens at least 3 days/week.  Sometimes the pain can last for hours.  Sometimes she has a sensation to need to use restroom right away.  She does get some nausea.  No vomiting.  No diarrhea.  No blood in the stool or vomit.  Regarding recent drop in hemoglobin, no heavy periods.  No known blood loss or bruising.  Otherwise normal state of health   Past Medical History:  Diagnosis Date   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   CT abdomen shows normal liver 2018    Migraine    Positive ANA (antinuclear antibody)    resolved when she was evaluated by Rheumatology   Vitamin D deficiency    Current Outpatient Medications on File Prior to Visit  Medication Sig Dispense Refill   amphetamine-dextroamphetamine (ADDERALL) 30 MG tablet Take 1 tablet by mouth 2 (two) times daily.     butalbital-acetaminophen-caffeine (FIORICET) 50-325-40 MG tablet Take 1-2 tablets by mouth every 6 (six) hours as needed for headache. 20 tablet 1   Erenumab-aooe (AIMOVIG) 70 MG/ML SOAJ Inject 70 mg into the skin every 30 (thirty) days. 1.12 mL 2   levonorgestrel (MIRENA) 20 MCG/24HR IUD 1 each by Intrauterine route once.     omeprazole (PRILOSEC) 40 MG capsule Take 1 capsule (40 mg total) by mouth daily. 30 capsule 3   ondansetron (ZOFRAN) 4 MG tablet Take 1 tablet (4 mg total) by mouth every 8 (eight) hours as needed for nausea or vomiting. 20 tablet 3   sucralfate (CARAFATE) 1 g tablet Take 1 tablet (1 g total) by mouth 4 (four) times daily -  with meals and at bedtime. 30 tablet 0   No current facility-administered medications on file prior to visit.    Past Surgical History:  Procedure Laterality Date   WISDOM TOOTH EXTRACTION      Review of Systems As in subjective    Objective:   Physical Exam Due to coronavirus pandemic stay at home measures, patient visit was virtual  and they were not examined in person.   General: Well-developed well-nourished no acute distress     Assessment:     Encounter Diagnoses  Name Primary?   Right upper quadrant abdominal pain Yes   Beta thalassemia (HCC)    Nausea    Anemia, unspecified type    Abnormal biliary HIDA scan        Plan:     We reviewed back over her recent evaluation.  Her recent abdominal ultrasound in late October was normal.  Recent HIDA scan showed delayed ejection fraction.  Given her ongoing gallbladder symptoms and abdominal pain symptoms we will go ahead and refer to general surgery for consult given  the gallbladder symptoms and scan.  She has her first appointment with gastroenterology only December 1 given recent anemia on labs and recent symptoms  Recent drop in hemoglobin-awaiting further evaluation gastroenterology  Underlying beta thalassemia  Advise she avoid fatty and greasy and fried foods for now.  Keep foods bland.  Follow-up as planned with specialist  She asked about referral to hematology.  At this point I advised he start with the gastroenterologist evaluation first.  She has underlying thalassemia but recent hemoglobin drop.  We will start with GI evaluation  Jill Hobbs was seen today for abdominal pain.  Diagnoses and all orders for this visit:  Right upper quadrant abdominal pain -     Ambulatory referral to General Surgery  Beta thalassemia (HCC)  Nausea -     Ambulatory referral to General Surgery  Anemia, unspecified type  Abnormal biliary HIDA scan -     Ambulatory referral to General Surgery  Follow-up with specialists

## 2021-10-09 ENCOUNTER — Telehealth: Payer: Self-pay

## 2021-10-09 MED ORDER — AIMOVIG 70 MG/ML ~~LOC~~ SOAJ: 70.0000 mg | SUBCUTANEOUS | 2 refills | Status: DC

## 2021-10-09 NOTE — Telephone Encounter (Signed)
done

## 2021-10-09 NOTE — Telephone Encounter (Signed)
Fax refill request for Aimovig 70mg  from 

## 2021-10-10 DIAGNOSIS — Z6833 Body mass index (BMI) 33.0-33.9, adult: Secondary | ICD-10-CM | POA: Diagnosis not present

## 2021-10-10 DIAGNOSIS — D561 Beta thalassemia: Secondary | ICD-10-CM | POA: Diagnosis not present

## 2021-10-10 DIAGNOSIS — Z724 Inappropriate diet and eating habits: Secondary | ICD-10-CM | POA: Diagnosis not present

## 2021-10-10 DIAGNOSIS — E669 Obesity, unspecified: Secondary | ICD-10-CM | POA: Diagnosis not present

## 2021-10-12 DIAGNOSIS — D509 Iron deficiency anemia, unspecified: Secondary | ICD-10-CM | POA: Diagnosis not present

## 2021-10-12 DIAGNOSIS — K828 Other specified diseases of gallbladder: Secondary | ICD-10-CM | POA: Diagnosis not present

## 2021-10-12 DIAGNOSIS — D561 Beta thalassemia: Secondary | ICD-10-CM | POA: Diagnosis not present

## 2021-10-12 DIAGNOSIS — N920 Excessive and frequent menstruation with regular cycle: Secondary | ICD-10-CM | POA: Diagnosis not present

## 2021-10-12 DIAGNOSIS — Z419 Encounter for procedure for purposes other than remedying health state, unspecified: Secondary | ICD-10-CM | POA: Diagnosis not present

## 2021-10-21 ENCOUNTER — Telehealth: Payer: Self-pay

## 2021-10-21 NOTE — Telephone Encounter (Signed)
P.A. AIMOVIG °

## 2021-10-25 ENCOUNTER — Ambulatory Visit: Payer: Self-pay | Admitting: Surgery

## 2021-10-25 DIAGNOSIS — R109 Unspecified abdominal pain: Secondary | ICD-10-CM | POA: Diagnosis not present

## 2021-10-25 DIAGNOSIS — K828 Other specified diseases of gallbladder: Secondary | ICD-10-CM | POA: Diagnosis not present

## 2021-10-25 NOTE — H&P (Signed)
Jill Hobbs [id number]   Referring Provider:  Ernst Breach, *   Subjective   Chief Complaint: Gallbladder     History of Present Illness: 35 year old woman with beta thalassemia, migraines, positive ANA and historic LFT elevation, vitamin D deficiency who presents with abdominal pain that has been going on for about 2 months.  This is typically in the right upper quadrant.  She presented to the emergency room in October for this and noted that the pain typically lasts about 2 hours and occurs after eating.  Some associated nausea and vomiting as well as intermittent diarrhea.  Typically 7 out of 10 severity worse with palpation or movement.  This was initially happening about once a week but now is about twice a week.  Seems to happen after eating greasy foods, but sometimes she is able to tolerate such foods without any symptoms. Also was noted to have new anemia (in setting of known beta Thal), she has followed up with gastroenterology.   Ultrasound 09/04/2021 negative for gallstones, no gallbladder wall thickening, common bile duct normal at 4.7 mm. HIDA 09/27/2021 showed gallbladder EF of 30% (normal is greater than 33%)  No previous abdominal surgeries.  Review of Systems: A complete review of systems was obtained from the patient.  I have reviewed this information and discussed as appropriate with the patient.  See HPI as well for other ROS.   Medical History: Past Medical History:  Diagnosis Date   Anemia     There is no problem list on file for this patient.   Past Surgical History:  Procedure Laterality Date   wisdom tooth removal N/A      Allergies  Allergen Reactions   Oxycodone-Acetaminophen Rash   Topiramate Other (See Comments) and Rash    Blood in stool Blood in stool     Current Outpatient Medications on File Prior to Visit  Medication Sig Dispense Refill   butalbital-acetaminophen-caffeine (FIORICET) 50-325-40 mg tablet Take 1-2  tablets by mouth every 6 (six) hours as needed for headache.     erenumab-aooe (AIMOVIG AUTOINJECTOR) 70 mg/mL AtIn Inject subcutaneously     ondansetron (ZOFRAN) 4 MG tablet Take 1 tablet (4 mg total) by mouth every 8 (eight) hours as needed     No current facility-administered medications on file prior to visit.    Family History  Problem Relation Age of Onset   Obesity Mother      Social History   Tobacco Use  Smoking Status Every Day   Types: Cigarettes  Smokeless Tobacco Never     Social History   Socioeconomic History   Marital status: Single  Tobacco Use   Smoking status: Every Day    Types: Cigarettes   Smokeless tobacco: Never  Vaping Use   Vaping Use: Never used  Substance and Sexual Activity   Alcohol use: Yes   Drug use: Yes    Objective:    Vitals:   10/25/21 1543  BP: (!) 140/82  Pulse: (!) 111  Temp: 36.7 C (98 F)  SpO2: 100%  Weight: 86.5 kg (190 lb 9.6 oz)  Height: 154.9 cm (5\' 1" )    Body mass index is 36.01 kg/m.  Alert well-appearing Unlabored respirations Abdomen soft nontender  Assessment and Plan:  Diagnoses and all orders for this visit:  Abdominal pain, unspecified abdominal location  Biliary dyskinesia    I recommend proceeding with laparoscopic or robotic cholecystectomy with possible cholangiogram.  Discussed the relevant anatomy using a diagram  to demonstrate, and went over surgical technique.  Discussed risks of surgery including bleeding, pain, scarring, intraabdominal injury specifically to the common bile duct and sequelae, bile leak, conversion to open surgery, failure to resolve symptoms, blood clots/ pulmonary embolus, heart attack, pneumonia, stroke, death. Questions welcomed and answered to patient's satisfaction.  Will schedule at patient's convenience  Anecia Nusbaum Carlye Grippe, MD

## 2021-11-07 NOTE — Telephone Encounter (Signed)
P.A. approved til 10/21/22 sent mychart message

## 2021-11-07 NOTE — Progress Notes (Addendum)
Jill Hobbs  11/07/2021   Your procedure is scheduled on:  11/29/2021.    Report to [facility] Main  Entrance   Report to admitting at   0800AM     Call this number if you have problems the morning of surgery [phone]    Remember: Do not eat food , candy gum or mints :After Midnight. You may have clear liquids from midnight until __  0730am   CLEAR LIQUID DIET   Foods Allowed                                                                       Coffee and tea, regular and decaf                              Plain Jell-O any favor except red or purple                                            Fruit ices (not with fruit pulp)                                      Iced Popsicles                                     Carbonated beverages, regular and diet                                    Cranberry, grape and apple juices Sports drinks like Gatorade Lightly seasoned clear broth or consume(fat free) Sugar   _____________________________________________________________________    BRUSH YOUR TEETH MORNING OF SURGERY AND RINSE YOUR MOUTH OUT, NO CHEWING GUM CANDY OR MINTS.     Take these medicines the morning of surgery with A SIP OF WATER: prilosec   DO NOT TAKE ANY DIABETIC MEDICATIONS DAY OF YOUR SURGERY                               You may not have any metal on your body including hair pins and              piercings  Do not wear jewelry, make-up, lotions, powders or perfumes, deodorant             Do not wear nail polish on your fingernails.  Do not shave  48 hours prior to surgery.              Men may shave face and neck.   Do not bring valuables to the hospital. [facility] IS NOT             RESPONSIBLE   FOR VALUABLES.  Contacts, dentures or bridgework may not be  worn into surgery.  Leave suitcase in the car. After surgery it may be brought to your room.     Patients discharged the day of surgery will not be allowed to drive  home. IF YOU ARE HAVING SURGERY AND GOING HOME THE SAME DAY, YOU MUST HAVE AN ADULT TO DRIVE YOU HOME AND BE WITH YOU FOR 24 HOURS. YOU MAY GO HOME BY TAXI OR UBER OR ORTHERWISE, BUT AN ADULT MUST ACCOMPANY YOU HOME AND STAY WITH YOU FOR 24 HOURS.  Name and phone number of your driver:  Special Instructions: N/A              Please read over the following fact sheets you were given: _____________________________________________________________________  [facility] - Preparing for Surgery Before surgery, you can play an important role.  Because skin is not sterile, your skin needs to be as free of germs as possible.  You can reduce the number of germs on your skin by washing with CHG (chlorahexidine gluconate) soap before surgery.  CHG is an antiseptic cleaner which kills germs and bonds with the skin to continue killing germs even after washing. Please DO NOT use if you have an allergy to CHG or antibacterial soaps.  If your skin becomes reddened/irritated stop using the CHG and inform your nurse when you arrive at Short Stay. Do not shave (including legs and underarms) for at least 48 hours prior to the first CHG shower.  You may shave your face/neck. Please follow these instructions carefully:  1.  Shower with CHG Soap the night before surgery and the  morning of Surgery.  2.  If you choose to wash your hair, wash your hair first as usual with your  normal  shampoo.  3.  After you shampoo, rinse your hair and body thoroughly to remove the  shampoo.                           4.  Use CHG as you would any other liquid soap.  You can apply chg directly  to the skin and wash                       Gently with a scrungie or clean washcloth.  5.  Apply the CHG Soap to your body ONLY FROM THE NECK DOWN.   Do not use on face/ open                           Wound or open sores. Avoid contact with eyes, ears mouth and genitals (private parts).                       Wash face,  Genitals (private parts) with  your normal soap.             6.  Wash thoroughly, paying special attention to the area where your surgery  will be performed.  7.  Thoroughly rinse your body with warm water from the neck down.  8.  DO NOT shower/wash with your normal soap after using and rinsing off  the CHG Soap.                9.  Pat yourself dry with a clean towel.            10.  Wear clean pajamas.  11.  Place clean sheets on your bed the night of your first shower and do not  sleep with pets. Day of Surgery : Do not apply any lotions/deodorants the morning of surgery.  Please wear clean clothes to the hospital/surgery center.  FAILURE TO FOLLOW THESE INSTRUCTIONS MAY RESULT IN THE CANCELLATION OF YOUR SURGERY PATIENT SIGNATURE_________________________________  NURSE SIGNATURE__________________________________  ________________________________________________________________________

## 2021-11-07 NOTE — Progress Notes (Addendum)
Anesthesia Review:  PCP: Kristian Covey last visit- 10/03/21  Cardiologist :none  Chest x-ray : EKG : Echo : Stress test: Cardiac Cath :  Activity level: can do a flight of stairs without difficulty  Sleep Study/ CPAP : none  Fasting Blood Sugar :      / Checks Blood Sugar -- times a day:   Blood Thinner/ Instructions /Last Dose: ASA / Instructions/ Last Dose :   No covid test ambulatory surgeyr .

## 2021-11-12 DIAGNOSIS — Z419 Encounter for procedure for purposes other than remedying health state, unspecified: Secondary | ICD-10-CM | POA: Diagnosis not present

## 2021-11-20 ENCOUNTER — Encounter (HOSPITAL_COMMUNITY)
Admission: RE | Admit: 2021-11-20 | Discharge: 2021-11-20 | Disposition: A | Discharge status: Home / Self Care | Payer: Medicaid Other | Source: Ambulatory Visit | Attending: Surgery | Admitting: Surgery

## 2021-11-20 ENCOUNTER — Other Ambulatory Visit: Payer: Self-pay

## 2021-11-20 ENCOUNTER — Encounter (HOSPITAL_COMMUNITY): Payer: Self-pay

## 2021-11-20 VITALS — BP 123/73 | HR 88 | Temp 98.8°F | Resp 18 | Ht 61.0 in | Wt 186.0 lb

## 2021-11-20 DIAGNOSIS — Z01812 Encounter for preprocedural laboratory examination: Secondary | ICD-10-CM | POA: Diagnosis not present

## 2021-11-20 DIAGNOSIS — Z01818 Encounter for other preprocedural examination: Secondary | ICD-10-CM

## 2021-11-20 LAB — CBC
HCT: 37.7 % (ref 36.0–46.0)
Hemoglobin: 11.8 g/dL — ABNORMAL LOW (ref 12.0–15.0)
MCH: 22.1 pg — ABNORMAL LOW (ref 26.0–34.0)
MCHC: 31.3 g/dL (ref 30.0–36.0)
MCV: 70.5 fL — ABNORMAL LOW (ref 80.0–100.0)
Platelets: 327 10*3/uL (ref 150–400)
RBC: 5.35 MIL/uL — ABNORMAL HIGH (ref 3.87–5.11)
RDW: 15.3 % (ref 11.5–15.5)
WBC: 6 10*3/uL (ref 4.0–10.5)
nRBC: 0 % (ref 0.0–0.2)

## 2021-11-28 NOTE — H&P (Signed)
° ° ° °Jill Hobbs °[id number]  ° °Referring Provider:  Tysinger, David Shane, * ° ° °Subjective  ° °Chief Complaint: Gallbladder °  ° ° °History of Present Illness: °35-year-old woman with beta thalassemia, migraines, positive ANA and historic LFT elevation, vitamin D deficiency who presents with abdominal pain that has been going on for about 2 months.  This is typically in the right upper quadrant.  She presented to the emergency room in October for this and noted that the pain typically lasts about 2 hours and occurs after eating.  Some associated nausea and vomiting as well as intermittent diarrhea.  Typically 7 out of 10 severity worse with palpation or movement.  This was initially happening about once a week but now is about twice a week.  Seems to happen after eating greasy foods, but sometimes she is able to tolerate such foods without any symptoms. °Also was noted to have new anemia (in setting of known beta Thal), she has followed up with gastroenterology. ° ° °Ultrasound 09/04/2021 negative for gallstones, no gallbladder wall thickening, common bile duct normal at 4.7 mm. °HIDA 09/27/2021 showed gallbladder EF of 30% (normal is greater than 33%) ° °No previous abdominal surgeries. ° °Review of Systems: °A complete review of systems was obtained from the patient.  I have reviewed this information and discussed as appropriate with the patient.  See HPI as well for other ROS. ° ° °Medical History: °Past Medical History:  °Diagnosis Date  ° Anemia   ° ° °There is no problem list on file for this patient. ° ° °Past Surgical History:  °Procedure Laterality Date  ° wisdom tooth removal N/A   °  ° °Allergies  °Allergen Reactions  ° Oxycodone-Acetaminophen Rash  ° Topiramate Other (See Comments) and Rash  °  Blood in stool °Blood in stool °  ° ° °Current Outpatient Medications on File Prior to Visit  °Medication Sig Dispense Refill  ° butalbital-acetaminophen-caffeine (FIORICET) 50-325-40 mg tablet Take 1-2  tablets by mouth every 6 (six) hours as needed for headache.    ° erenumab-aooe (AIMOVIG AUTOINJECTOR) 70 mg/mL AtIn Inject subcutaneously    ° ondansetron (ZOFRAN) 4 MG tablet Take 1 tablet (4 mg total) by mouth every 8 (eight) hours as needed    ° °No current facility-administered medications on file prior to visit.  ° ° °Family History  °Problem Relation Age of Onset  ° Obesity Mother   °  ° °Social History  ° °Tobacco Use  °Smoking Status Every Day  ° Types: Cigarettes  °Smokeless Tobacco Never  °  ° °Social History  ° °Socioeconomic History  ° Marital status: Single  °Tobacco Use  ° Smoking status: Every Day  °  Types: Cigarettes  ° Smokeless tobacco: Never  °Vaping Use  ° Vaping Use: Never used  °Substance and Sexual Activity  ° Alcohol use: Yes  ° Drug use: Yes  ° ° °Objective:  ° ° °Vitals:  ° 10/25/21 1543  °BP: (!) 140/82  °Pulse: (!) 111  °Temp: 36.7 °C (98 °F)  °SpO2: 100%  °Weight: 86.5 kg (190 lb 9.6 oz)  °Height: 154.9 cm (5' 1")  °  °Body mass index is 36.01 kg/m². ° °Alert well-appearing °Unlabored respirations °Abdomen soft nontender ° °Assessment and Plan:  °Diagnoses and all orders for this visit: ° °Abdominal pain, unspecified abdominal location ° °Biliary dyskinesia ° °  °I recommend proceeding with laparoscopic or robotic cholecystectomy with possible cholangiogram.  Discussed the relevant anatomy using a diagram   to demonstrate, and went over surgical technique.  Discussed risks of surgery including bleeding, pain, scarring, intraabdominal injury specifically to the common bile duct and sequelae, bile leak, conversion to open surgery, failure to resolve symptoms, blood clots/ pulmonary embolus, heart attack, pneumonia, stroke, death. Questions welcomed and answered to patient's satisfaction.  Will schedule at patient's convenience ° °Jill Hobbs AMANDA Shauntay Brunelli, MD  ° ° ° ° °

## 2021-11-28 NOTE — Anesthesia Preprocedure Evaluation (Addendum)
Anesthesia Evaluation  Patient identified by MRN, date of birth, ID band Patient awake    Reviewed: Allergy & Precautions, NPO status , Patient's Chart, lab work & pertinent test results  Airway Mallampati: I  TM Distance: >3 FB Neck ROM: Full    Dental no notable dental hx. (+) Teeth Intact, Dental Advisory Given   Pulmonary    Pulmonary exam normal breath sounds clear to auscultation       Cardiovascular Exercise Tolerance: Good negative cardio ROS Normal cardiovascular exam Rhythm:Regular Rate:Normal     Neuro/Psych  Headaches,    GI/Hepatic negative GI ROS, Neg liver ROS,   Endo/Other    Renal/GU negative Renal ROS     Musculoskeletal   Abdominal (+) + obese (BMI 35.1),   Peds  Hematology  (+) anemia , Lab Results      Component                Value               Date                      WBC                      6.0                 11/20/2021                HGB                      11.8 (L)            11/20/2021                HCT                      37.7                11/20/2021                       PLT                      327                 11/20/2021            B thalassemia   Anesthesia Other Findings All: Percocet and topamax  Reproductive/Obstetrics negative OB ROS                            Anesthesia Physical Anesthesia Plan  ASA: 2  Anesthesia Plan: General   Post-op Pain Management:    Induction: Intravenous  PONV Risk Score and Plan: 4 or greater and Treatment may vary due to age or medical condition, Midazolam, Ondansetron and Dexamethasone  Airway Management Planned: Oral ETT  Additional Equipment: None  Intra-op Plan:   Post-operative Plan: Extubation in OR  Informed Consent: I have reviewed the patients History and Physical, chart, labs and discussed the procedure including the risks, benefits and alternatives for the proposed anesthesia with the  patient or authorized representative who has indicated his/her understanding and acceptance.     Dental advisory given  Plan Discussed with: CRNA and Anesthesiologist  Anesthesia Plan Comments:        Anesthesia Quick Evaluation

## 2021-11-29 ENCOUNTER — Ambulatory Visit (HOSPITAL_COMMUNITY)
Admission: RE | Admit: 2021-11-29 | Discharge: 2021-11-29 | Disposition: A | Discharge status: Home / Self Care | Payer: Medicaid Other | Source: Ambulatory Visit | Attending: Surgery | Admitting: Surgery

## 2021-11-29 ENCOUNTER — Ambulatory Visit (HOSPITAL_COMMUNITY): Payer: Medicaid Other | Admitting: Certified Registered Nurse Anesthetist

## 2021-11-29 ENCOUNTER — Other Ambulatory Visit: Payer: Self-pay

## 2021-11-29 ENCOUNTER — Encounter (HOSPITAL_COMMUNITY)
Admission: RE | Disposition: A | Discharge status: Home / Self Care | Payer: Self-pay | Source: Ambulatory Visit | Attending: Surgery

## 2021-11-29 DIAGNOSIS — Z6835 Body mass index (BMI) 35.0-35.9, adult: Secondary | ICD-10-CM | POA: Insufficient documentation

## 2021-11-29 DIAGNOSIS — E669 Obesity, unspecified: Secondary | ICD-10-CM | POA: Diagnosis not present

## 2021-11-29 DIAGNOSIS — D649 Anemia, unspecified: Secondary | ICD-10-CM | POA: Diagnosis not present

## 2021-11-29 DIAGNOSIS — F17210 Nicotine dependence, cigarettes, uncomplicated: Secondary | ICD-10-CM | POA: Insufficient documentation

## 2021-11-29 DIAGNOSIS — K828 Other specified diseases of gallbladder: Secondary | ICD-10-CM | POA: Insufficient documentation

## 2021-11-29 DIAGNOSIS — K811 Chronic cholecystitis: Secondary | ICD-10-CM | POA: Diagnosis not present

## 2021-11-29 DIAGNOSIS — Z01818 Encounter for other preprocedural examination: Secondary | ICD-10-CM

## 2021-11-29 HISTORY — PX: CHOLECYSTECTOMY: SHX55

## 2021-11-29 LAB — PREGNANCY, URINE: Preg Test, Ur: NEGATIVE

## 2021-11-29 SURGERY — LAPAROSCOPIC CHOLECYSTECTOMY
Anesthesia: General | Site: Abdomen

## 2021-11-29 MED ORDER — LACTATED RINGERS IV SOLN: INTRAVENOUS | Status: DC

## 2021-11-29 MED ORDER — LIDOCAINE 2% (20 MG/ML) 5 ML SYRINGE
INTRAMUSCULAR | Status: AC
  Filled 2021-11-29: qty 5

## 2021-11-29 MED ORDER — OXYCODONE HCL 5 MG/5ML PO SOLN: 5.0000 mg | Freq: Once | ORAL | Status: AC | PRN

## 2021-11-29 MED ORDER — KETOROLAC TROMETHAMINE 30 MG/ML IJ SOLN: 30.0000 mg | Freq: Once | INTRAMUSCULAR | Status: AC | PRN

## 2021-11-29 MED ORDER — BUPIVACAINE-EPINEPHRINE 0.25% -1:200000 IJ SOLN
INTRAMUSCULAR | Status: DC | PRN
  Administered 2021-11-29: 30 mL

## 2021-11-29 MED ORDER — INDOCYANINE GREEN 25 MG IV SOLR
2.5000 mg | Freq: Once | INTRAVENOUS | Status: AC
  Administered 2021-11-29: 2.5 mg via INTRAVENOUS
  Filled 2021-11-29: qty 1

## 2021-11-29 MED ORDER — DEXMEDETOMIDINE (PRECEDEX) IN NS 20 MCG/5ML (4 MCG/ML) IV SYRINGE
PREFILLED_SYRINGE | INTRAVENOUS | Status: DC | PRN
  Administered 2021-11-29: 12 ug via INTRAVENOUS
  Administered 2021-11-29: 8 ug via INTRAVENOUS

## 2021-11-29 MED ORDER — DEXAMETHASONE SODIUM PHOSPHATE 10 MG/ML IJ SOLN
INTRAMUSCULAR | Status: AC
  Filled 2021-11-29: qty 1

## 2021-11-29 MED ORDER — SUGAMMADEX SODIUM 200 MG/2ML IV SOLN
INTRAVENOUS | Status: DC | PRN
  Administered 2021-11-29: 200 mg via INTRAVENOUS

## 2021-11-29 MED ORDER — CEFAZOLIN SODIUM-DEXTROSE 2-4 GM/100ML-% IV SOLN
2.0000 g | INTRAVENOUS | Status: AC
  Administered 2021-11-29: 2 g via INTRAVENOUS
  Filled 2021-11-29: qty 100

## 2021-11-29 MED ORDER — MIDAZOLAM HCL 5 MG/5ML IJ SOLN
INTRAMUSCULAR | Status: DC | PRN
  Administered 2021-11-29: 2 mg via INTRAVENOUS

## 2021-11-29 MED ORDER — OXYCODONE HCL 5 MG PO TABS: 5.0000 mg | ORAL_TABLET | Freq: Once | ORAL | Status: AC | PRN

## 2021-11-29 MED ORDER — CHLORHEXIDINE GLUCONATE 0.12 % MT SOLN
15.0000 mL | Freq: Once | OROMUCOSAL | Status: AC
  Administered 2021-11-29: 15 mL via OROMUCOSAL

## 2021-11-29 MED ORDER — ACETAMINOPHEN 500 MG PO TABS
1000.0000 mg | ORAL_TABLET | ORAL | Status: AC
  Administered 2021-11-29: 1000 mg via ORAL
  Filled 2021-11-29: qty 2

## 2021-11-29 MED ORDER — HYDROMORPHONE HCL 1 MG/ML IJ SOLN
INTRAMUSCULAR | Status: AC
  Administered 2021-11-29: 0.5 mg via INTRAVENOUS
  Filled 2021-11-29: qty 2

## 2021-11-29 MED ORDER — LACTATED RINGERS IV SOLN
INTRAVENOUS | Status: DC | PRN
  Administered 2021-11-29: 1000 mL

## 2021-11-29 MED ORDER — BUPIVACAINE-EPINEPHRINE (PF) 0.25% -1:200000 IJ SOLN
INTRAMUSCULAR | Status: AC
  Filled 2021-11-29: qty 30

## 2021-11-29 MED ORDER — ROCURONIUM BROMIDE 10 MG/ML (PF) SYRINGE
PREFILLED_SYRINGE | INTRAVENOUS | Status: AC
  Filled 2021-11-29: qty 10

## 2021-11-29 MED ORDER — CHLORHEXIDINE GLUCONATE 4 % EX LIQD: 60.0000 mL | Freq: Once | CUTANEOUS | Status: DC

## 2021-11-29 MED ORDER — ONDANSETRON HCL 4 MG/2ML IJ SOLN: 4.0000 mg | Freq: Once | INTRAMUSCULAR | Status: DC | PRN

## 2021-11-29 MED ORDER — BUPIVACAINE LIPOSOME 1.3 % IJ SUSP: 20.0000 mL | Freq: Once | INTRAMUSCULAR | Status: DC

## 2021-11-29 MED ORDER — ONDANSETRON HCL 4 MG/2ML IJ SOLN
INTRAMUSCULAR | Status: AC
  Filled 2021-11-29: qty 2

## 2021-11-29 MED ORDER — MIDAZOLAM HCL 2 MG/2ML IJ SOLN
INTRAMUSCULAR | Status: AC
  Filled 2021-11-29: qty 2

## 2021-11-29 MED ORDER — KETOROLAC TROMETHAMINE 30 MG/ML IJ SOLN
INTRAMUSCULAR | Status: AC
  Administered 2021-11-29: 30 mg via INTRAVENOUS
  Filled 2021-11-29: qty 1

## 2021-11-29 MED ORDER — OXYCODONE HCL 5 MG PO TABS
ORAL_TABLET | ORAL | Status: AC
  Administered 2021-11-29: 5 mg via ORAL
  Filled 2021-11-29: qty 1

## 2021-11-29 MED ORDER — PROPOFOL 10 MG/ML IV BOLUS
INTRAVENOUS | Status: AC
  Filled 2021-11-29: qty 20

## 2021-11-29 MED ORDER — PHENYLEPHRINE 40 MCG/ML (10ML) SYRINGE FOR IV PUSH (FOR BLOOD PRESSURE SUPPORT)
PREFILLED_SYRINGE | INTRAVENOUS | Status: AC
  Filled 2021-11-29: qty 10

## 2021-11-29 MED ORDER — GABAPENTIN 300 MG PO CAPS
300.0000 mg | ORAL_CAPSULE | ORAL | Status: AC
  Administered 2021-11-29: 300 mg via ORAL
  Filled 2021-11-29: qty 1

## 2021-11-29 MED ORDER — ROCURONIUM BROMIDE 10 MG/ML (PF) SYRINGE
PREFILLED_SYRINGE | INTRAVENOUS | Status: DC | PRN
  Administered 2021-11-29: 50 mg via INTRAVENOUS

## 2021-11-29 MED ORDER — TRAMADOL HCL 50 MG PO TABS: 50.0000 mg | ORAL_TABLET | Freq: Four times a day (QID) | ORAL | 0 refills | Status: AC | PRN

## 2021-11-29 MED ORDER — FENTANYL CITRATE (PF) 100 MCG/2ML IJ SOLN
INTRAMUSCULAR | Status: DC | PRN
  Administered 2021-11-29 (×2): 50 ug via INTRAVENOUS

## 2021-11-29 MED ORDER — LIDOCAINE 2% (20 MG/ML) 5 ML SYRINGE
INTRAMUSCULAR | Status: DC | PRN
  Administered 2021-11-29: 100 mg via INTRAVENOUS

## 2021-11-29 MED ORDER — DEXAMETHASONE SODIUM PHOSPHATE 10 MG/ML IJ SOLN
INTRAMUSCULAR | Status: DC | PRN
  Administered 2021-11-29: 10 mg via INTRAVENOUS

## 2021-11-29 MED ORDER — HYDROMORPHONE HCL 1 MG/ML IJ SOLN
0.2500 mg | INTRAMUSCULAR | Status: DC | PRN
  Administered 2021-11-29: 0.5 mg via INTRAVENOUS

## 2021-11-29 MED ORDER — DOCUSATE SODIUM 100 MG PO CAPS: 100.0000 mg | ORAL_CAPSULE | Freq: Two times a day (BID) | ORAL | 0 refills | Status: AC

## 2021-11-29 MED ORDER — PROPOFOL 10 MG/ML IV BOLUS
INTRAVENOUS | Status: DC | PRN
  Administered 2021-11-29: 150 mg via INTRAVENOUS

## 2021-11-29 MED ORDER — PHENYLEPHRINE 40 MCG/ML (10ML) SYRINGE FOR IV PUSH (FOR BLOOD PRESSURE SUPPORT)
PREFILLED_SYRINGE | INTRAVENOUS | Status: DC | PRN
  Administered 2021-11-29: 200 ug via INTRAVENOUS

## 2021-11-29 MED ORDER — FENTANYL CITRATE (PF) 100 MCG/2ML IJ SOLN
INTRAMUSCULAR | Status: AC
  Filled 2021-11-29: qty 2

## 2021-11-29 MED ORDER — ONDANSETRON HCL 4 MG/2ML IJ SOLN
INTRAMUSCULAR | Status: DC | PRN
  Administered 2021-11-29: 4 mg via INTRAVENOUS

## 2021-11-29 MED ORDER — ORAL CARE MOUTH RINSE: 15.0000 mL | Freq: Once | OROMUCOSAL | Status: AC

## 2021-11-29 SURGICAL SUPPLY — 43 items
ADH SKN CLS APL DERMABOND .7 (GAUZE/BANDAGES/DRESSINGS) ×1
APL PRP STRL LF DISP 70% ISPRP (MISCELLANEOUS) ×1
APPLIER CLIP ROT 10 11.4 M/L (STAPLE) ×2
APR CLP MED LRG 11.4X10 (STAPLE) ×1
BAG COUNTER SPONGE SURGICOUNT (BAG) IMPLANT
BAG SPEC RTRVL LRG 6X4 10 (ENDOMECHANICALS) ×1
BAG SPNG CNTER NS LX DISP (BAG)
CABLE HIGH FREQUENCY MONO STRZ (ELECTRODE) ×3 IMPLANT
CHLORAPREP W/TINT 26 (MISCELLANEOUS) ×3 IMPLANT
CLIP APPLIE ROT 10 11.4 M/L (STAPLE) ×2 IMPLANT
COVER MAYO STAND STRL (DRAPES) IMPLANT
COVER SURGICAL LIGHT HANDLE (MISCELLANEOUS) ×3 IMPLANT
DECANTER SPIKE VIAL GLASS SM (MISCELLANEOUS) ×3 IMPLANT
DERMABOND ADVANCED (GAUZE/BANDAGES/DRESSINGS) ×1
DERMABOND ADVANCED .7 DNX12 (GAUZE/BANDAGES/DRESSINGS) ×2 IMPLANT
DRAPE C-ARM 42X120 X-RAY (DRAPES) IMPLANT
ELECT REM PT RETURN 15FT ADLT (MISCELLANEOUS) ×3 IMPLANT
GAUZE 4X4 16PLY ~~LOC~~+RFID DBL (SPONGE) ×1 IMPLANT
GLOVE SURG ENC MOIS LTX SZ6 (GLOVE) ×3 IMPLANT
GLOVE SURG MICRO LTX SZ6 (GLOVE) ×3 IMPLANT
GLOVE SURG UNDER LTX SZ6.5 (GLOVE) ×3 IMPLANT
GOWN STRL REUS W/TWL LRG LVL3 (GOWN DISPOSABLE) ×3 IMPLANT
GOWN STRL REUS W/TWL XL LVL3 (GOWN DISPOSABLE) ×6 IMPLANT
GRASPER SUT TROCAR 14GX15 (MISCELLANEOUS) ×3 IMPLANT
HEMOSTAT SNOW SURGICEL 2X4 (HEMOSTASIS) IMPLANT
IRRIG SUCT STRYKERFLOW 2 WTIP (MISCELLANEOUS) ×2
IRRIGATION SUCT STRKRFLW 2 WTP (MISCELLANEOUS) ×2 IMPLANT
KIT BASIN OR (CUSTOM PROCEDURE TRAY) ×3 IMPLANT
KIT TURNOVER KIT A (KITS) IMPLANT
NDL INSUFFLATION 14GA 120MM (NEEDLE) ×2 IMPLANT
NEEDLE INSUFFLATION 14GA 120MM (NEEDLE) ×2 IMPLANT
PENCIL SMOKE EVACUATOR (MISCELLANEOUS) IMPLANT
POUCH SPECIMEN RETRIEVAL 10MM (ENDOMECHANICALS) ×3 IMPLANT
SCISSORS LAP 5X35 DISP (ENDOMECHANICALS) ×3 IMPLANT
SET CHOLANGIOGRAPH MIX (MISCELLANEOUS) IMPLANT
SET TUBE SMOKE EVAC HIGH FLOW (TUBING) ×3 IMPLANT
SLEEVE XCEL OPT CAN 5 100 (ENDOMECHANICALS) ×6 IMPLANT
SUT MNCRL AB 4-0 PS2 18 (SUTURE) ×3 IMPLANT
TOWEL OR 17X26 10 PK STRL BLUE (TOWEL DISPOSABLE) ×3 IMPLANT
TOWEL OR NON WOVEN STRL DISP B (DISPOSABLE) IMPLANT
TRAY LAPAROSCOPIC (CUSTOM PROCEDURE TRAY) ×3 IMPLANT
TROCAR BLADELESS OPT 5 100 (ENDOMECHANICALS) ×3 IMPLANT
TROCAR XCEL 12X100 BLDLESS (ENDOMECHANICALS) ×3 IMPLANT

## 2021-11-29 NOTE — Anesthesia Postprocedure Evaluation (Signed)
Anesthesia Post Note  Patient: Jill Hobbs  Procedure(s) Performed: LAPAROSCOPIC CHOLECYSTECTOMY (Abdomen)     Patient location during evaluation: PACU Anesthesia Type: General Level of consciousness: awake and alert Pain management: pain level controlled Vital Signs Assessment: post-procedure vital signs reviewed and stable Respiratory status: spontaneous breathing, nonlabored ventilation, respiratory function stable and patient connected to nasal cannula oxygen Cardiovascular status: blood pressure returned to baseline and stable Postop Assessment: no apparent nausea or vomiting Anesthetic complications: no   No notable events documented.  Last Vitals:  Vitals:   11/29/21 1015 11/29/21 1048  BP: 126/82 114/78  Pulse: 81 74  Resp: 10   Temp:    SpO2: 96% 100%    Last Pain:  Vitals:   11/29/21 1048  TempSrc:   PainSc: 3                  Trevor Iha

## 2021-11-29 NOTE — Discharge Instructions (Signed)
LAPAROSCOPIC SURGERY: POST OP INSTRUCTIONS   EAT Gradually transition to a high fiber diet with a fiber supplement over the next few weeks after discharge.  Start with a pureed / full liquid diet (see below)  WALK Walk an hour a day.  Control your pain to do that.    CONTROL PAIN Control pain so that you can walk, sleep, tolerate sneezing/coughing, go up/down stairs.  HAVE A BOWEL MOVEMENT DAILY Keep your bowels regular to avoid problems.  OK to try a laxative to override constipation.  OK to use an antidairrheal to slow down diarrhea.  Call if not better after 2 tries  CALL IF YOU HAVE PROBLEMS/CONCERNS Call if you are still struggling despite following these instructions. Call if you have concerns not answered by these instructions    DIET: Follow a light bland diet & liquids the first 24 hours after arrival home, such as soup, liquids, starches, etc.  Be sure to drink plenty of fluids.  Quickly advance to a usual solid diet within a few days.  Avoid fast food or heavy meals as your are more likely to get nauseated or have irregular bowels.  A low-sugar, high-fiber diet for the rest of your life is ideal.  Take your usually prescribed home medications unless otherwise directed.  PAIN CONTROL: Pain is best controlled by a usual combination of three different methods TOGETHER: Ice/Heat Over the counter pain medication Prescription pain medication Most patients will experience some swelling and bruising around the incisions.  Ice packs or heating pads (30-60 minutes up to 6 times a day) will help. Use ice for the first few days to help decrease swelling and bruising, then switch to heat to help relax tight/sore spots and speed recovery.  Some people prefer to use ice alone, heat alone, alternating between ice & heat.  Experiment to what works for you.  Swelling and bruising can take several weeks to resolve.   It is helpful to take an over-the-counter pain medication regularly for the  first few days: Naproxen (Aleve, etc)  Two 220mg  tabs twice a day OR Ibuprofen (Advil, etc) Three 200mg  tabs four times a day (every meal & bedtime) AND Acetaminophen (Tylenol, etc) 500-650mg  four times a day (every meal & bedtime) A  prescription for pain medication (such as oxycodone, hydrocodone, tramadol, gabapentin, methocarbamol, etc) should be given to you upon discharge.  Take your pain medication as prescribed, IF NEEDED.  If you are having problems/concerns with the prescription medicine (does not control pain, nausea, vomiting, rash, itching, etc), please call us [phone] to see if we need to switch you to a different pain medicine that will work better for you and/or control your side effect better. If you need a refill on your pain medication, please give [place] 48 hour notice.  contact your pharmacy.  They will contact our office to request authorization. Prescriptions will not be filled after 5 pm or on week-ends  Avoid getting constipated.   Between the surgery and the pain medications, it is common to experience some constipation.   Increasing fluid intake and taking a fiber supplement (such as Metamucil, Citrucel, FiberCon, MiraLax, etc) 1-2 times a day regularly will usually help prevent this problem from occurring.   A mild laxative (prune juice, Milk of Magnesia, MiraLax, etc) should be taken according to package directions if there are no bowel movements after 48 hours.   Watch out for diarrhea.   If you have many loose bowel movements, simplify your diet  to bland foods & liquids for a few days.   Stop any stool softeners and decrease your fiber supplement.   Switching to mild anti-diarrheal medications (Kayopectate, Pepto Bismol) can help.   If this worsens or does not improve, please call us.  Wash / shower every day.  You may shower over the skin glue which is waterproof.  No rubbing, scrubbing, lotions or ointments to incisions until completely healed.  Do not submerge or  soak until completely healed.  Glue will flake off after about 2 weeks.  You may leave the incision open to air.  You may replace a dressing/Band-Aid to cover the incision for comfort if you wish.   ACTIVITIES as tolerated:   You may resume regular (light) daily activities beginning the next day--such as daily self-care, walking, climbing stairs--gradually increasing activities as tolerated.  If you can walk 30 minutes without difficulty, it is safe to try more intense activity such as jogging, treadmill, bicycling, low-impact aerobics, swimming, etc. Save the most intensive and strenuous activity for last such as sit-ups, heavy lifting, contact sports, etc  Refrain from any heavy lifting or straining until you are off narcotics for pain control.   DO NOT PUSH THROUGH PAIN.  Let pain be your guide: If it hurts to do something, don't do it.  Pain is your body warning you to avoid that activity for another week until the pain goes down. You may drive when you are no longer taking prescription pain medication, you can comfortably wear a seatbelt, and you can safely maneuver your car and apply brakes. You may have sexual intercourse when it is comfortable.  FOLLOW UP in our office Please call [facility] at [phone] to set up an appointment to see your surgeon in the office for a follow-up appointment approximately 2-3 weeks after your surgery. Make sure that you call for this appointment the day you arrive home to insure a convenient appointment time.  10. IF YOU HAVE DISABILITY OR FAMILY LEAVE FORMS, BRING THEM TO THE OFFICE FOR PROCESSING.  DO NOT GIVE THEM TO YOUR DOCTOR.   WHEN TO CALL us [phone]: Poor pain control Reactions / problems with new medications (rash/itching, nausea, etc)  Fever over 101.5 F (38.5 C) Inability to urinate Nausea and/or vomiting Worsening swelling or bruising Continued bleeding from incision. Increased pain, redness, or drainage from the incision   The  clinic staff is available to answer your questions during regular business hours (8:30am-5pm).  Please dont hesitate to call and ask to speak to one of our nurses for clinical concerns.   If you have a medical emergency, go to the nearest emergency room or call 911.  A surgeon from [facility] is always on call at the [facility], [place] [address], [place], [place]  [address] ? MAIN: [phone] ? TOLL FREE: [phone] ?  FAX [phone] [email or web address]

## 2021-11-29 NOTE — Transfer of Care (Signed)
Immediate Anesthesia Transfer of Care Note  Patient: Jill Hobbs  Procedure(s) Performed: LAPAROSCOPIC CHOLECYSTECTOMY (Abdomen)  Patient Location: PACU  Anesthesia Type:General  Level of Consciousness: awake, alert  and oriented  Airway & Oxygen Therapy: Patient Spontanous Breathing and Patient connected to face mask  Post-op Assessment: Report given to RN and Post -op Vital signs reviewed and stable  Post vital signs: Reviewed and stable  Last Vitals:  Vitals Value Taken Time  BP 123/82 11/29/21 0945  Temp    Pulse 84 11/29/21 0948  Resp 19 11/29/21 0948  SpO2 97 % 11/29/21 0948  Vitals shown include unvalidated device data.  Last Pain:  Vitals:   11/29/21 0701  TempSrc: Oral  PainSc:       Patients Stated Pain Goal: 5 (11/29/21 0272)  Complications: No notable events documented.

## 2021-11-29 NOTE — Op Note (Signed)
Operative Note  MIZUKI HOEL 36 y.o. female [id number]  11/29/2021  Surgeon: Berna Bue MD FACS  Assistant: Edrick Kins MD ([id number]) I was personally present during the key and critical portions of this procedure and immediately available throughout the entire procedure, as documented in my operative note.  Procedure performed: Laparoscopic Cholecystectomy  Preop diagnosis: biliary dyskinesia Post-op diagnosis/intraop findings: same  Specimens: gallbladder  Retained items: none  EBL: minimal  Complications: none  Description of procedure: After obtaining informed consent the patient was brought to the operating room. Antibiotics were administered. SCD's were applied. General endotracheal anesthesia was initiated and a formal time-out was performed. The abdomen was prepped and draped in the usual sterile fashion and the abdomen was entered using an infraumbilical veress needle after instilling the site with local. Insufflation to was obtained, 75mm trocar and camera inserted, and gross inspection revealed no evidence of injury from our entry or other intraabdominal abnormalities. Two 50mm trocars were introduced in the right midclavicular and right anterior axillary lines under direct visualization and following infiltration with local. An 78mm trocar was placed in the epigastrium. The gallbladder was retracted cephalad and the infundibulum was retracted laterally. A combination of hook electrocautery and blunt dissection was utilized to clear the peritoneum from the neck and cystic duct, circumferentially isolating the cystic artery and cystic duct and lifting the gallbladder from the cystic plate. The critical view of safety was achieved with the cystic artery, cystic duct, and liver bed visualized between them with no other structures. The artery was clipped with two clips proximally and one distally and divided as was the cystic duct with three clips on the proximal end. The  gallbladder was dissected from the liver plate using electrocautery. Once freed the gallbladder was placed in an endocatch bag and removed through the epigastric trocar site. A small amount of bleeding on the liver bed was controlled with cautery. Some bile had been spilled from the gallbladder during its dissection from the liver bed. This was aspirated and the right upper quadrant was irrigated copiously until the effluent was clear. Hemostasis was once again confirmed, and reinspection of the abdomen revealed no injuries. The clips were well opposed without any bile leak from the duct or the liver bed. The 53mm trocar site in the epigastrium was closed with a 0 vicryl in the fascia under direct visualization using a PMI device. The abdomen was desufflated and all trocars removed. The skin incisions were closed with subcuticular 4-0 monocryl and Dermabond. The patient was awakened, extubated and transported to the recovery room in stable condition.    All counts were correct at the completion of the case.

## 2021-11-29 NOTE — Anesthesia Procedure Notes (Signed)
Procedure Name: Intubation Date/Time: 11/29/2021 8:36 AM Performed by: Sudie Grumbling, CRNA Pre-anesthesia Checklist: Patient identified, Emergency Drugs available, Suction available and Patient being monitored Patient Re-evaluated:Patient Re-evaluated prior to induction Oxygen Delivery Method: Circle system utilized Preoxygenation: Pre-oxygenation with 100% oxygen Induction Type: IV induction Ventilation: Mask ventilation without difficulty Laryngoscope Size: Miller and 2 Grade View: Grade I Tube type: Oral Tube size: 7.0 mm Number of attempts: 1 Airway Equipment and Method: Stylet Placement Confirmation: ETT inserted through vocal cords under direct vision, positive ETCO2 and breath sounds checked- equal and bilateral Secured at: 22 cm Tube secured with: Tape Dental Injury: Teeth and Oropharynx as per pre-operative assessment

## 2021-11-30 ENCOUNTER — Encounter (HOSPITAL_COMMUNITY): Payer: Self-pay | Admitting: Surgery

## 2021-12-01 LAB — SURGICAL PATHOLOGY

## 2021-12-06 ENCOUNTER — Ambulatory Visit: Payer: Medicaid Other | Admitting: Neurology

## 2021-12-07 ENCOUNTER — Other Ambulatory Visit: Payer: Self-pay | Admitting: Medical

## 2021-12-12 DIAGNOSIS — E669 Obesity, unspecified: Secondary | ICD-10-CM | POA: Diagnosis not present

## 2021-12-12 DIAGNOSIS — Z713 Dietary counseling and surveillance: Secondary | ICD-10-CM | POA: Diagnosis not present

## 2021-12-12 DIAGNOSIS — Z6834 Body mass index (BMI) 34.0-34.9, adult: Secondary | ICD-10-CM | POA: Diagnosis not present

## 2021-12-13 ENCOUNTER — Ambulatory Visit: Payer: Medicaid Other | Admitting: Neurology

## 2021-12-13 DIAGNOSIS — G43911 Migraine, unspecified, intractable, with status migrainosus: Secondary | ICD-10-CM

## 2021-12-13 DIAGNOSIS — R0683 Snoring: Secondary | ICD-10-CM

## 2021-12-13 DIAGNOSIS — Z419 Encounter for procedure for purposes other than remedying health state, unspecified: Secondary | ICD-10-CM | POA: Diagnosis not present

## 2021-12-13 DIAGNOSIS — D561 Beta thalassemia: Secondary | ICD-10-CM

## 2021-12-13 DIAGNOSIS — G44011 Episodic cluster headache, intractable: Secondary | ICD-10-CM

## 2021-12-13 DIAGNOSIS — Z6834 Body mass index (BMI) 34.0-34.9, adult: Secondary | ICD-10-CM

## 2021-12-13 DIAGNOSIS — E6609 Other obesity due to excess calories: Secondary | ICD-10-CM

## 2021-12-18 NOTE — Progress Notes (Signed)
° ° °  Jill Hobbs made a first attempt at performing a home sleep test on 06 December 2021.  Also there was a recording time of 6 hours and 6 minutes present there were no events that could be specified.  For this reason not having data about oxygen desaturation about REM versus non-REM sleep and no apnea hypopnea indices the study was deemed invalid.  He had repeated.  This was done on 13 December 2021 when again none of these values were picked up.   It was then that I decided to invite the patient for an in lab sleep study as the home sleep tests have failed for some reason to give valid data.  Larey Seat, MD

## 2021-12-25 DIAGNOSIS — E7800 Pure hypercholesterolemia, unspecified: Secondary | ICD-10-CM | POA: Diagnosis not present

## 2021-12-25 DIAGNOSIS — K219 Gastro-esophageal reflux disease without esophagitis: Secondary | ICD-10-CM | POA: Diagnosis not present

## 2021-12-25 DIAGNOSIS — E669 Obesity, unspecified: Secondary | ICD-10-CM | POA: Diagnosis not present

## 2021-12-25 DIAGNOSIS — D6489 Other specified anemias: Secondary | ICD-10-CM | POA: Diagnosis not present

## 2021-12-25 DIAGNOSIS — Z724 Inappropriate diet and eating habits: Secondary | ICD-10-CM | POA: Diagnosis not present

## 2021-12-25 DIAGNOSIS — F909 Attention-deficit hyperactivity disorder, unspecified type: Secondary | ICD-10-CM | POA: Diagnosis not present

## 2021-12-27 ENCOUNTER — Other Ambulatory Visit: Payer: Self-pay | Admitting: Medical

## 2021-12-28 ENCOUNTER — Other Ambulatory Visit: Payer: Self-pay | Admitting: Medical

## 2022-01-03 DIAGNOSIS — D509 Iron deficiency anemia, unspecified: Secondary | ICD-10-CM | POA: Diagnosis not present

## 2022-01-03 DIAGNOSIS — D563 Thalassemia minor: Secondary | ICD-10-CM | POA: Diagnosis not present

## 2022-01-08 DIAGNOSIS — Z713 Dietary counseling and surveillance: Secondary | ICD-10-CM | POA: Diagnosis not present

## 2022-01-08 DIAGNOSIS — E669 Obesity, unspecified: Secondary | ICD-10-CM | POA: Diagnosis not present

## 2022-01-10 DIAGNOSIS — Z419 Encounter for procedure for purposes other than remedying health state, unspecified: Secondary | ICD-10-CM | POA: Diagnosis not present

## 2022-01-11 ENCOUNTER — Ambulatory Visit (INDEPENDENT_AMBULATORY_CARE_PROVIDER_SITE_OTHER): Payer: Medicaid Other | Admitting: Neurology

## 2022-01-11 ENCOUNTER — Other Ambulatory Visit: Payer: Self-pay

## 2022-01-11 DIAGNOSIS — G4733 Obstructive sleep apnea (adult) (pediatric): Secondary | ICD-10-CM

## 2022-01-11 DIAGNOSIS — D561 Beta thalassemia: Secondary | ICD-10-CM

## 2022-01-11 DIAGNOSIS — G44011 Episodic cluster headache, intractable: Secondary | ICD-10-CM

## 2022-01-11 DIAGNOSIS — E6609 Other obesity due to excess calories: Secondary | ICD-10-CM

## 2022-01-11 DIAGNOSIS — G43911 Migraine, unspecified, intractable, with status migrainosus: Secondary | ICD-10-CM

## 2022-01-12 NOTE — Progress Notes (Signed)
Several brief arousals were noted which increased in the morning hours.  ?? ?IMPRESSION: ?? ?1. No Obstructive Sleep Apnea (OSA) ?2. No Periodic Limb Movement Disorder (PLMD) ?3. Mild Snoring ?4. All arousals were spontaneous and could not be correlated to physiological changes. ?5. After each REM sleep episode, the patient did not transition to other sleep stages, but woke up from sleep.  ?? ?? ?RECOMMENDATIONS: No CPAP intervention is indicated. No organic sleep disorder was identified.  ?Spontaneous arousals can be caused by pain, discomfort, Please consider treating for this.  ?A follow up with me is optional.   ?? ??

## 2022-01-12 NOTE — Procedures (Signed)
PATIENT'S NAME:  Jill Hobbs, Jill Hobbs ?DOB:      1986-09-12      ?MR#:    [id number]     ?DATE OF RECORDING: 01/11/2022 [name] ?REFERRING M.Hobbs.:  Crosby Oyster, PA-C ?Study Performed:   Baseline Polysomnogram ?HISTORY:  Jill Hobbs is a 36 year-old [place] American female patient who has been seen in a consultation, requested by PA Tysinger on 09/14/2021.   ?Chief concern according to patient:  " I am unable to sleep through the night, some nights I have trouble to go to sleep, and I still wake up frequently after using a sleep aid, OTC melatonin, Tried trazodone". I have a harder time sleeping with migraine and have been woken by cluster HA, these are sharp, stabbing through eye or temple".  ?Aimovig has improved frequency of all headaches.  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   ?She felt she had to take work home. She was anxious and insomnia resulted out of stress.  ? ?The patient endorsed the Epworth Sleepiness Scale at 5 points.   ?The patient's weight 182 pounds with a height of 61 (inches), resulting in a BMI of 34.5 kg/m2. ?The patient's neck circumference measured 14 inches. ? ?CURRENT MEDICATIONS: Adderall, Fioricet, Aimovig, Mirena, Prilosec, Zofran, Carafate ?  ?PROCEDURE:  This is a multichannel digital polysomnogram utilizing the Somnostar 11.2 system.  Electrodes and sensors were applied and monitored per AASM Specifications.   EEG, EOG, Chin and Limb EMG, were sampled at 200 Hz.  ECG, Snore and Nasal Pressure, Thermal Airflow, Respiratory Effort, CPAP Flow and Pressure, Oximetry was sampled at 50 Hz. Digital video and audio were recorded.     ? ?BASELINE STUDY: Lights Out was at 21:43 and Lights On at 05:32.  Total recording time (TRT) was 469.5 minutes, with a total sleep time (TST) of 408.5 minutes.   The patient's sleep latency was 39.5 minutes.  REM latency was 93 minutes.  The sleep efficiency  was 87.0 %.  ?   ?SLEEP ARCHITECTURE: WASO (Wake after sleep onset) was 36 minutes.  There were 26.5 minutes in Stage N1, 272 minutes Stage N2, 11.5 minutes Stage N3 and 98.5 minutes in Stage REM.  The percentage of Stage N1 was 6.5%, Stage N2 was 66.6%, Stage N3 was 2.8% and Stage R (REM sleep) was 24.1%.  ?RESPIRATORY ANALYSIS:  There were a total of 4 respiratory events:  0 obstructive apneas, 2 central apneas and 0 mixed apneas with a total of 2 apneas and an apnea index (AI) of 0.3 /hour. There were 2 hypopneas with a hypopnea index of 0.3 /hour.  ?    ?The total APNEA/HYPOPNEA INDEX (AHI) was 0.6/hour.  2 events occurred in REM sleep and 4 events in NREM. The REM AHI was 1.2 /hour, versus a non-REM AHI of 0.4/h. The patient spent 0 minutes of total sleep time in the supine position and 409 minutes in non-supine- The supine AHI was 0.0 versus a non-supine AHI of 0.6. ? ?OXYGEN SATURATION & C02:  The Wake baseline 02 saturation was 98%, with the lowest being 90%. Time spent below 89% saturation equaled 0 minutes. ? ?The patient had a total of 0 Periodic Limb Movements.   ?The arousals were noted as: 30 were spontaneous, 0 were associated with PLMs, 0 were associated with respiratory events. ?Audio and video analysis did not show any abnormal or unusual movements, behaviors, phonations  or vocalizations.  Patient slept prone. No bathroom breaks were noted neither was any moderate Snoring noted. ?EKG was in keeping with normal sinus rhythm (NSR). ?Several brief arousals were noted which increased in the morning hours.  ? ?IMPRESSION: ? ?No Obstructive Sleep Apnea (OSA) ?No Periodic Limb Movement Disorder (PLMD) ?Mild Snoring ?All arousals were spontaneous and could not be correlated to physiological changes. ?After each REM sleep episode, the patient did not transition to other sleep stages, but woke up from sleep.  ? ? ?RECOMMENDATIONS: No CPAP intervention is indicated. No organic sleep disorder was identified.   ?Spontaneous arousals can be caused by pain, discomfort, Please consider treating for this.  ?A follow up with me is optional.   ? ? ?I certify that I have reviewed the entire raw data recording prior to the issuance of this report in accordance with the Standards of Accreditation of the American Academy of Sleep Medicine (AASM) ? ?Melvyn Novas, MD ?Diplomat, American Board of Neurology  ?Diplomat, Biomedical engineer of Sleep Medicine ?Medical Director, [facility] ? ? ?

## 2022-01-16 ENCOUNTER — Encounter: Payer: Self-pay | Admitting: Neurology

## 2022-01-17 ENCOUNTER — Other Ambulatory Visit (INDEPENDENT_AMBULATORY_CARE_PROVIDER_SITE_OTHER): Payer: Medicaid Other

## 2022-01-17 ENCOUNTER — Other Ambulatory Visit: Payer: Self-pay

## 2022-01-17 ENCOUNTER — Telehealth: Payer: Self-pay

## 2022-01-17 DIAGNOSIS — Z23 Encounter for immunization: Secondary | ICD-10-CM | POA: Diagnosis not present

## 2022-01-17 DIAGNOSIS — Z789 Other specified health status: Secondary | ICD-10-CM | POA: Diagnosis not present

## 2022-01-17 NOTE — Telephone Encounter (Signed)
Pt coming in today at 2 for a flu shot and wants to see if she could get a Hep B also. She had her last one in 02/1998.  ?

## 2022-01-17 NOTE — Procedures (Signed)
° ° °

## 2022-01-18 LAB — HEPATITIS B SURFACE ANTIBODY, QUANTITATIVE: Hepatitis B Surf Ab Quant: 15.6 m[IU]/mL (ref >9.9)

## 2022-02-08 ENCOUNTER — Encounter: Payer: Self-pay | Admitting: Physician Assistant

## 2022-02-08 ENCOUNTER — Ambulatory Visit: Payer: Medicaid Other | Admitting: Physician Assistant

## 2022-02-08 VITALS — BP 120/70 | Temp 97.3°F | Ht 61.0 in | Wt 190.2 lb

## 2022-02-08 DIAGNOSIS — K219 Gastro-esophageal reflux disease without esophagitis: Secondary | ICD-10-CM | POA: Insufficient documentation

## 2022-02-08 DIAGNOSIS — H6503 Acute serous otitis media, bilateral: Secondary | ICD-10-CM

## 2022-02-08 DIAGNOSIS — Z975 Presence of (intrauterine) contraceptive device: Secondary | ICD-10-CM

## 2022-02-08 DIAGNOSIS — E782 Mixed hyperlipidemia: Secondary | ICD-10-CM | POA: Insufficient documentation

## 2022-02-08 DIAGNOSIS — N921 Excessive and frequent menstruation with irregular cycle: Secondary | ICD-10-CM | POA: Diagnosis not present

## 2022-02-08 MED ORDER — AZITHROMYCIN 250 MG PO TABS: ORAL_TABLET | ORAL | 0 refills | Status: AC

## 2022-02-08 MED ORDER — FLUCONAZOLE 100 MG PO TABS: 200.0000 mg | ORAL_TABLET | Freq: Every day | ORAL | 0 refills | Status: DC

## 2022-02-08 NOTE — Progress Notes (Addendum)
? ?Acute Office Visit ? ?Subjective:  ? ? Patient ID: Jill Hobbs, female    DOB: October 07, 1986, 36 y.o.   MRN: [id number] ? ?Chief Complaint  ?Patient presents with  ? Acute Visit  ?  Ear pain in both ears. The right ear hurts the worse. Pt also wants to discuss meds for period.  ? ? ?HPI ?Patient is in today for bilateral ear pain, right > left x 6 days; OTC Ibuprofen helped a little,  Tylenol didn't help very much; denies previous ear surgeries. ? ?Also reports spotting for a few weeks, and states she intermittently has spotting all of the time; IUD Mirena placed by [facility] and was checked last year and is due for replacement in 2026. ? ?Outpatient Medications Prior to Visit  ?Medication Sig Dispense Refill  ? AIMOVIG 70 MG/ML SOAJ Inject 70 mg into the skin every 30 (thirty) days. 1 mL 2  ? butalbital-acetaminophen-caffeine (FIORICET) 50-325-40 MG tablet Take 1-2 tablets by mouth every 6 (six) hours as needed for headache. 20 tablet 1  ? levonorgestrel (MIRENA) 20 MCG/24HR IUD 1 each by Intrauterine route once.    ? phentermine 15 MG capsule Take 15 mg by mouth every morning.    ? omeprazole (PRILOSEC) 40 MG capsule Take 40 mg by mouth daily. (Patient not taking: Reported on 02/08/2022)    ? amphetamine-dextroamphetamine (ADDERALL) 30 MG tablet Take 30 mg by mouth 2 (two) times daily.    ? ?No facility-administered medications prior to visit.  ? ? ?Allergies  ?Allergen Reactions  ? Percocet [Oxycodone-Acetaminophen] Rash  ? Topamax [Topiramate] Rash and Other (See Comments)  ?  Blood in stool  ? ? ?Review of Systems  ?Constitutional:  Negative for activity change and chills.  ?HENT:  Positive for ear pain. Negative for congestion, ear discharge, facial swelling, hearing loss, postnasal drip, rhinorrhea, sinus pressure, sinus pain, sneezing and voice change.   ?Eyes:  Negative for pain and redness.  ?Respiratory:  Negative for cough and wheezing.   ?Cardiovascular:  Negative for chest pain.   ?Gastrointestinal:  Negative for constipation, diarrhea, nausea and vomiting.  ?Endocrine: Negative for polyuria.  ?Genitourinary:  Positive for vaginal bleeding. Negative for frequency.  ?Skin:  Negative for color change and rash.  ?Allergic/Immunologic: Negative for immunocompromised state.  ?Neurological:  Negative for dizziness.  ?Psychiatric/Behavioral:  Negative for agitation.   ? ?   ?Objective:  ?  ?Physical Exam ?Vitals and nursing note reviewed.  ?Constitutional:   ?   General: She is not in acute distress. ?   Appearance: Normal appearance. She is not ill-appearing.  ?HENT:  ?   Head: Normocephalic and atraumatic.  ?   Right Ear: Hearing, ear canal and external ear normal. Tympanic membrane is not erythematous.  ?   Left Ear: Hearing, ear canal and external ear normal. Tympanic membrane is not erythematous.  ?   Ears:  ?   Comments: Bilateral TM with serous effusion ?   Nose: No congestion.  ?Eyes:  ?   Extraocular Movements: Extraocular movements intact.  ?   Conjunctiva/sclera: Conjunctivae normal.  ?   Pupils: Pupils are equal, round, and reactive to light.  ?Cardiovascular:  ?   Rate and Rhythm: Normal rate and regular rhythm.  ?   Pulses: Normal pulses.  ?   Heart sounds: Normal heart sounds.  ?Pulmonary:  ?   Effort: Pulmonary effort is normal.  ?   Breath sounds: Normal breath sounds. No wheezing.  ?Abdominal:  ?  General: Bowel sounds are normal.  ?   Palpations: Abdomen is soft.  ?Musculoskeletal:  ?   Cervical back: Normal range of motion and neck supple.  ?   Right lower leg: No edema.  ?   Left lower leg: No edema.  ?Skin: ?   General: Skin is warm and dry.  ?   Findings: No bruising.  ?Neurological:  ?   General: No focal deficit present.  ?   Mental Status: She is alert and oriented to person, place, and time.  ?Psychiatric:     ?   Mood and Affect: Mood normal.     ?   Behavior: Behavior normal.     ?   Thought Content: Thought content normal.  ? ? ?BP 120/70   Temp (!) 97.3 ?F (36.3 ?C)    Wt 190 lb 3.2 oz (86.3 kg)   LMP 01/24/2022 (Within Days) Comment: Per pt  BMI 35.94 kg/m?  ? ?Wt Readings from Last 3 Encounters:  ?02/08/22 190 lb 3.2 oz (86.3 kg)  ?11/29/21 186 lb (84.4 kg)  ?11/20/21 186 lb (84.4 kg)  ? ? ?Results for orders placed or performed in visit on 01/17/22  ?Hepatitis B surface antibody,quantitative  ?Result Value Ref Range  ? Hepatitis B Surf Ab Quant 15.6 Immunity>9.9 mIU/mL  ? ? ?   ?Assessment & Plan:  ?1. Non-recurrent acute serous otitis media of both ears ?- zpak ? ?2. Breakthrough bleeding associated with intrauterine device (IUD) ?- [facility] ? ? ?Meds ordered this encounter  ?Medications  ? azithromycin (ZITHROMAX) 250 MG tablet  ?  Sig: Take 2 tablets on day 1, then 1 tablet daily on days 2 through 5  ?  Dispense:  6 tablet  ?  Refill:  0  ?  Order Specific Question:   Supervising Provider  ?  Answer:   Ronnald Nian [[id number]]  ? fluconazole (DIFLUCAN) 100 MG tablet  ?  Sig: Take 2 tablets (200 mg total) by mouth daily. Take 2 tablets by mouth at the start of symptoms, then may repeat in 7 days if needed.  ?  Dispense:  4 tablet  ?  Refill:  0  ?  Order Specific Question:   Supervising Provider  ?  Answer:   Ronnald Nian [[id number]]  ? ? ?Return in about 5 months (around 07/23/2022) for Return for Annual Exam. ? ?Jake Shark, PA-C ?

## 2022-02-10 DIAGNOSIS — Z419 Encounter for procedure for purposes other than remedying health state, unspecified: Secondary | ICD-10-CM | POA: Diagnosis not present

## 2022-02-12 DIAGNOSIS — Z724 Inappropriate diet and eating habits: Secondary | ICD-10-CM | POA: Diagnosis not present

## 2022-02-12 DIAGNOSIS — E669 Obesity, unspecified: Secondary | ICD-10-CM | POA: Diagnosis not present

## 2022-03-05 DIAGNOSIS — Z713 Dietary counseling and surveillance: Secondary | ICD-10-CM | POA: Diagnosis not present

## 2022-03-05 DIAGNOSIS — E669 Obesity, unspecified: Secondary | ICD-10-CM | POA: Diagnosis not present

## 2022-03-12 ENCOUNTER — Ambulatory Visit: Payer: Medicaid Other | Admitting: Medical

## 2022-03-12 VITALS — BP 120/76 | HR 75 | Temp 97.5°F | Wt 188.2 lb

## 2022-03-12 DIAGNOSIS — Z419 Encounter for procedure for purposes other than remedying health state, unspecified: Secondary | ICD-10-CM | POA: Diagnosis not present

## 2022-03-12 DIAGNOSIS — H5712 Ocular pain, left eye: Secondary | ICD-10-CM | POA: Diagnosis not present

## 2022-03-12 DIAGNOSIS — H9202 Otalgia, left ear: Secondary | ICD-10-CM

## 2022-03-12 DIAGNOSIS — D509 Iron deficiency anemia, unspecified: Secondary | ICD-10-CM | POA: Diagnosis not present

## 2022-03-12 NOTE — Progress Notes (Signed)
Subjective: ? Jill Hobbs is a 36 y.o. female who presents for ?Chief Complaint  ?Patient presents with  ? ear pain  ?  Ear pain over a month ago. Sula Soda gave her anitbiotic but at that time her left eye started hurting as well. Blurry vision. Pain will shoot through the back of eye  ?   ? ?Here for concern for eye pain and ear pain.  She notes left ear pain for about a month.  She was seen here by another provider a month ago put on medication for ear infection.  Symptoms persist.  She denies ear popping, congestion, sinus pressure or allergy symptoms. ? ?She notes some recent left eye pain.  No redness, no drainage, no injury.  She has a history of migraines but this does not feel like her typical migraine.  She saw her eye doctor back in August 2022 and there were no concerns at that time.  No history of increased ocular pressure. ? ?No vision loss.  Vision can be blurry at times. ? ?No other aggravating or relieving factors.   ? ?No other c/o. ? ?Past Medical History:  ?Diagnosis Date  ? Beta thalassemia (HCC)   ? presumed beta thal minor; never needed pRBC transfusion  ? LFT elevation   ? unclear etiology; negative work up in 08/2011.   CT abdomen shows normal liver 2018  ? Migraine   ? Positive ANA (antinuclear antibody)   ? resolved when she was evaluated by Rheumatology  ? Vitamin D deficiency   ? ?Current Outpatient Medications on File Prior to Visit  ?Medication Sig Dispense Refill  ? AIMOVIG 70 MG/ML SOAJ Inject 70 mg into the skin every 30 (thirty) days. 1 mL 2  ? butalbital-acetaminophen-caffeine (FIORICET) 50-325-40 MG tablet Take 1-2 tablets by mouth every 6 (six) hours as needed for headache. 20 tablet 1  ? levonorgestrel (MIRENA) 20 MCG/24HR IUD 1 each by Intrauterine route once.    ? omeprazole (PRILOSEC) 40 MG capsule Take 40 mg by mouth daily.    ? phentermine 15 MG capsule Take 15 mg by mouth every morning.    ? ?No current facility-administered medications on file prior to visit.   ? ? ? ?The following portions of the patient's history were reviewed and updated as appropriate: allergies, current medications, past family history, past medical history, past social history, past surgical history and problem list. ? ?ROS ?Otherwise as in subjective above ? ?Objective: ?BP 120/76   Pulse 75   Temp (!) 97.5 ?F (36.4 ?C)   Wt 188 lb 3.2 oz (85.4 kg)   BMI 35.56 kg/m?  ? ?General appearance: alert, no distress, well developed, well nourished ?HEENT: normocephalic, sclerae anicteric, conjunctiva pink and moist, TMs pearly, nares patent, no discharge or erythema, pharynx normal ?Oral cavity: MMM, no lesions ?Neck: supple, no lymphadenopathy, no thyromegaly, no masses ?Pulses: 2+ radial pulses, 2+ pedal pulses, normal cap refill ?Ext: no edema ? ?Hand-held Snellen 20/20 bilaterally without correction, field of vision seems within normal limits ? ? ? ?Assessment: ?Encounter Diagnoses  ?Name Primary?  ? Ear pain, left Yes  ? Eye pain, left   ? Microcytic anemia   ? ? ? ?Plan: ?We discussed the concerns.  We discussed differential.  No obvious infection today for ear or eye.  ? ?We will check labs given the possibility of autoimmune disease. ? ?If labs come back normal, consider trial of carbamazepine for potential for trigeminal neuralgia as a source of the symptoms ? ?  Advise she follow back up with her eye doctor about the symptoms as well ? ?Jill Hobbs was seen today for ear pain. ? ?Diagnoses and all orders for this visit: ? ?Ear pain, left ?-     C-reactive protein ?-     Sedimentation rate ?-     CBC ? ?Eye pain, left ?-     C-reactive protein ?-     Sedimentation rate ?-     CBC ? ?Microcytic anemia ?-     C-reactive protein ?-     Sedimentation rate ?-     CBC ? ? ? ?Follow up: pending labs ? ? ? ?

## 2022-03-13 ENCOUNTER — Other Ambulatory Visit: Payer: Self-pay | Admitting: Medical

## 2022-03-13 DIAGNOSIS — H9202 Otalgia, left ear: Secondary | ICD-10-CM

## 2022-03-13 DIAGNOSIS — G44011 Episodic cluster headache, intractable: Secondary | ICD-10-CM

## 2022-03-13 DIAGNOSIS — H5712 Ocular pain, left eye: Secondary | ICD-10-CM

## 2022-03-13 LAB — CBC
Hematocrit: 34.1 % (ref 34.0–46.6)
Hemoglobin: 11.1 g/dL (ref 11.1–15.9)
MCH: 22.6 pg — ABNORMAL LOW (ref 26.6–33.0)
MCHC: 32.6 g/dL (ref 31.5–35.7)
MCV: 69 fL — ABNORMAL LOW (ref 79–97)
Platelets: 348 10*3/uL (ref 150–450)
RBC: 4.92 x10E6/uL (ref 3.77–5.28)
RDW: 15.2 % (ref 11.7–15.4)
WBC: 4.4 10*3/uL (ref 3.4–10.8)

## 2022-03-13 LAB — SEDIMENTATION RATE: Sed Rate: 53 mm/hr — ABNORMAL HIGH (ref 0–32)

## 2022-03-13 LAB — C-REACTIVE PROTEIN: CRP: 3 mg/L (ref 0–10)

## 2022-03-19 ENCOUNTER — Telehealth: Payer: Self-pay | Admitting: Internal Medicine

## 2022-03-19 NOTE — Telephone Encounter (Signed)
Per [facility] imaging, ? ?patient's authorization is still pending in clinical review. Therefore the appt will need to be canceled until the authorization has been approved we will then reach out to the patient to get her rescheduled. They will contact pt to cancel ?

## 2022-03-20 ENCOUNTER — Other Ambulatory Visit: Payer: Medicaid Other

## 2022-03-21 ENCOUNTER — Other Ambulatory Visit: Payer: Self-pay | Admitting: Medical

## 2022-03-22 ENCOUNTER — Other Ambulatory Visit: Payer: Self-pay | Admitting: Medical

## 2022-03-30 ENCOUNTER — Ambulatory Visit
Admission: RE | Admit: 2022-03-30 | Discharge: 2022-03-30 | Disposition: A | Discharge status: Home / Self Care | Payer: Medicaid Other | Source: Ambulatory Visit | Attending: Medical | Admitting: Medical

## 2022-03-30 DIAGNOSIS — H5712 Ocular pain, left eye: Secondary | ICD-10-CM

## 2022-03-30 DIAGNOSIS — H9202 Otalgia, left ear: Secondary | ICD-10-CM

## 2022-03-30 DIAGNOSIS — G44011 Episodic cluster headache, intractable: Secondary | ICD-10-CM

## 2022-03-30 MED ORDER — GADOBENATE DIMEGLUMINE 529 MG/ML IV SOLN
17.0000 mL | Freq: Once | INTRAVENOUS | Status: AC | PRN
Start: 2022-03-30 — End: 2022-03-30
  Administered 2022-03-30: 17 mL via INTRAVENOUS

## 2022-04-02 ENCOUNTER — Ambulatory Visit: Payer: Medicaid Other | Admitting: Medical

## 2022-04-02 ENCOUNTER — Encounter: Payer: Self-pay | Admitting: Medical

## 2022-04-02 VITALS — BP 128/82 | HR 88 | Temp 98.0°F | Wt 184.4 lb

## 2022-04-02 DIAGNOSIS — Z8269 Family history of other diseases of the musculoskeletal system and connective tissue: Secondary | ICD-10-CM | POA: Insufficient documentation

## 2022-04-02 DIAGNOSIS — R531 Weakness: Secondary | ICD-10-CM

## 2022-04-02 DIAGNOSIS — R768 Other specified abnormal immunological findings in serum: Secondary | ICD-10-CM | POA: Diagnosis not present

## 2022-04-02 DIAGNOSIS — R700 Elevated erythrocyte sedimentation rate: Secondary | ICD-10-CM | POA: Diagnosis not present

## 2022-04-02 DIAGNOSIS — E611 Iron deficiency: Secondary | ICD-10-CM

## 2022-04-02 DIAGNOSIS — M2550 Pain in unspecified joint: Secondary | ICD-10-CM

## 2022-04-02 DIAGNOSIS — R202 Paresthesia of skin: Secondary | ICD-10-CM | POA: Insufficient documentation

## 2022-04-02 DIAGNOSIS — Z8261 Family history of arthritis: Secondary | ICD-10-CM | POA: Insufficient documentation

## 2022-04-02 DIAGNOSIS — M5440 Lumbago with sciatica, unspecified side: Secondary | ICD-10-CM | POA: Diagnosis not present

## 2022-04-02 DIAGNOSIS — G8929 Other chronic pain: Secondary | ICD-10-CM | POA: Insufficient documentation

## 2022-04-02 DIAGNOSIS — G43009 Migraine without aura, not intractable, without status migrainosus: Secondary | ICD-10-CM

## 2022-04-02 NOTE — Progress Notes (Signed)
Subjective:  Jill Hobbs is a 36 y.o. female who presents for Chief Complaint  Patient presents with   other    Discuss MRI results ear and eye pain still having the same pain nothing has changed     Here for recheck on left ear and left eye pain.  Since last visit no improvement or worsening.  When I saw her last visit Mar 12, 2022 she had some unusual symptoms of left ear and left eye pain without obvious cause.  I consulted with neurology at that time and recommended a head scan.  She is here to discuss this scan.  She notes in general that she has left-sided weakness compared to the right that has been ongoing.  She feels weak with arm and leg and feels like her left eyelid can droop at times.  She also has concern for autoimmune disease.  She notes multiple joint pains in multiple muscle aches.  She has chronic back pain.  She has dealt with this for years.  She denies any specific joint swelling but does get pain in her knees, pain in her back, some pains in her hands.  No rash, no history of psoriasis.  She has significant family history including an aunt with lupus, grandmother and uncle had lupus, maternal grandfather had rheumatoid arthritis.  All of these were on maternal side of the family  From her May 1 visit she had noted concern for eye pain and ear pain.  She notes left ear pain for about a month.  She was seen here by another provider a month ago put on medication for ear infection.  Symptoms persist.  She denies ear popping, congestion, sinus pressure or allergy symptoms.  She notes some recent left eye pain.  No redness, no drainage, no injury.  She has a history of migraines but this does not feel like her typical migraine.  She saw her eye doctor back in August 2022 and there were no concerns at that time.  No history of increased ocular pressure.  No vision loss.  Vision can be blurry at times.  No other aggravating or relieving factors.    No other c/o.  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   CT abdomen shows normal liver 2018   Migraine    Positive ANA (antinuclear antibody)    resolved when she was evaluated by Rheumatology   Vitamin D deficiency    Current Outpatient Medications on File Prior to Visit  Medication Sig Dispense Refill   AIMOVIG 70 MG/ML SOAJ Inject 70 mg into the skin every 30 (thirty) days. 1 mL 2   butalbital-acetaminophen-caffeine (FIORICET) 50-325-40 MG tablet Take 1-2 tablets by mouth every 6 (six) hours as needed for headache. 20 tablet 1   levonorgestrel (MIRENA) 20 MCG/24HR IUD 1 each by Intrauterine route once.     omeprazole (PRILOSEC) 40 MG capsule Take 40 mg by mouth daily.     phentermine 15 MG capsule Take 15 mg by mouth every morning. (Patient not taking: Reported on 04/02/2022)     No current facility-administered medications on file prior to visit.     The following portions of the patient's history were reviewed and updated as appropriate: allergies, current medications, past family history, past medical history, past social history, past surgical history and problem  list.  ROS Otherwise as in subjective above  Objective: BP 128/82   Pulse 88   Temp 98 F (36.7 C)   Wt 184 lb 6.4 oz (83.6 kg)   BMI 34.84 kg/m   General appearance: alert, no distress, well developed, well nourished HEENT: normocephalic, sclerae anicteric, conjunctiva pink and moist, TMs pearly, nares patent, no discharge or erythema, pharynx normal Oral cavity: MMM, no lesions Neck: supple, no lymphadenopathy, no thyromegaly, no masses Pulses: 2+ radial pulses, 2+ pedal pulses, normal cap refill Ext: no edema  Hand-held Snellen 20/20 bilaterally without correction, field of vision seems within normal limits    Assessment: Encounter Diagnoses  Name Primary?   ANA positive  Yes   Polyarthralgia    Paresthesia    Iron deficiency    Elevated sed rate    Chronic low back pain with sciatica, sciatica laterality unspecified, unspecified back pain laterality    Left-sided weakness    Family history of systemic lupus erythematosus    Family history of rheumatoid arthritis      Plan: We reviewed her MRI brain from 03/30/2022 showing mild sinus thickening but otherwise normal scan.  She has had no improvement in the symptoms, no worse or better.  At her last visit her sed rate was a little elevated at 53.  At this point she will see her eye doctor to rule out other etiologies.  I will go ahead and refer her to neurology for further evaluation as she also notes left-sided weakness in general going on for months now.  I advise she take a vitamin D supplement as well as begin back on iron once daily.  Her blood counts over the last few months have improved however her iron level has continued to remain a little low in recent months including family 2023.  Her recent CBC showed still decreased MCV although hemoglobin was back to normal.  She stopped phentermine thankfully.  She was on this prior to last visit through another doctor  Regarding migraines, she is seeing a big improvement on Aimovig.  However she still feels like these new ear and eye symptoms are not related to her migraines either in character or temporal standing  We also discussed her family history of lupus and rheumatoid disease.  Her father has multiple sclerosis.  We discussed that her symptoms are still unclear as to etiology.  Given her recent sed rate we will go ahead and order an ANA profile today.  She notes multiple joint and muscle pains including chronic back pain but no joint swelling.  No specific hand involvement and swelling but does get some hand pain from time to time.  Looking back she had abnormal findings in the lumbar spine on the 2018 CT abdomen pelvis.  Given her ongoing chronic back  pain we will go ahead and refer her to orthopedics at her request for further evaluation and management discussion  Jill Hobbs was seen today for other.  Diagnoses and all orders for this visit:  ANA positive -     ANA 12 Plus Profile (RDL) -     AMB referral to orthopedics  Polyarthralgia -     ANA 12 Plus Profile (RDL) -     AMB referral to orthopedics  Paresthesia -     ANA 12 Plus Profile (RDL)  Iron deficiency -     ANA 12 Plus Profile (RDL)  Elevated sed rate -     ANA 12 Plus Profile (RDL) -  AMB referral to orthopedics  Chronic low back pain with sciatica, sciatica laterality unspecified, unspecified back pain laterality -     AMB referral to orthopedics  Left-sided weakness  Family history of systemic lupus erythematosus  Family history of rheumatoid arthritis   Follow up: pending labs

## 2022-04-10 ENCOUNTER — Encounter: Payer: Self-pay | Admitting: Internal Medicine

## 2022-04-10 ENCOUNTER — Other Ambulatory Visit: Payer: Self-pay | Admitting: Internal Medicine

## 2022-04-10 DIAGNOSIS — M2550 Pain in unspecified joint: Secondary | ICD-10-CM

## 2022-04-10 DIAGNOSIS — R768 Other specified abnormal immunological findings in serum: Secondary | ICD-10-CM

## 2022-04-12 DIAGNOSIS — Z419 Encounter for procedure for purposes other than remedying health state, unspecified: Secondary | ICD-10-CM | POA: Diagnosis not present

## 2022-04-15 LAB — ANA 12 PLUS PROFILE, POSITIVE
Anti-CCP Ab, IgG & IgA (RDL): <20 Units (ref <20)
Anti-Cardiolipin Ab, IgA (RDL): <12 APL U/mL (ref <12)
Anti-Cardiolipin Ab, IgG (RDL): <15 GPL U/mL (ref <15)
Anti-Cardiolipin Ab, IgM (RDL): <13 MPL U/mL (ref <13)
Anti-Centromere Ab (RDL): <1:40 {titer}
Anti-Chromatin Ab, IgG (RDL): <20 Units (ref <20)
Anti-La (SS-B) Ab (RDL): <20 Units (ref <20)
Anti-Ro (SS-A) Ab (RDL): 155 Units — ABNORMAL HIGH (ref <20)
Anti-Scl-70 Ab (RDL): <20 Units (ref <20)
Anti-Sm Ab (RDL): <20 Units (ref <20)
Anti-TPO Ab (RDL): <9 IU/mL (ref <9.0)
Anti-U1 RNP Ab (RDL): <20 Units (ref <20)
Anti-dsDNA Ab by Farr(RDL): <8 IU/mL (ref <8.0)
C3 Complement (RDL): 235 mg/dL — ABNORMAL HIGH (ref 82–167)
C4 Complement (RDL): 61 mg/dL — ABNORMAL HIGH (ref 14–44)
Rheumatoid Factor by Turb RDL: <14 IU/mL (ref <14)
Speckled Pattern: 1:40 {titer} — ABNORMAL HIGH

## 2022-04-15 LAB — ANA 12 PLUS PROFILE (RDL): Anti-Nuclear Ab by IFA (RDL): POSITIVE — AB

## 2022-04-18 ENCOUNTER — Ambulatory Visit: Payer: Medicaid Other | Admitting: Physician Assistant

## 2022-04-23 ENCOUNTER — Telehealth: Payer: Self-pay | Admitting: Internal Medicine

## 2022-04-23 ENCOUNTER — Ambulatory Visit (INDEPENDENT_AMBULATORY_CARE_PROVIDER_SITE_OTHER): Payer: Medicaid Other | Admitting: Physician Assistant

## 2022-04-23 ENCOUNTER — Encounter: Payer: Self-pay | Admitting: Physician Assistant

## 2022-04-23 ENCOUNTER — Ambulatory Visit (INDEPENDENT_AMBULATORY_CARE_PROVIDER_SITE_OTHER): Payer: Medicaid Other

## 2022-04-23 DIAGNOSIS — M5459 Other low back pain: Secondary | ICD-10-CM

## 2022-04-23 DIAGNOSIS — G8929 Other chronic pain: Secondary | ICD-10-CM

## 2022-04-23 DIAGNOSIS — M545 Low back pain, unspecified: Secondary | ICD-10-CM

## 2022-04-23 NOTE — Telephone Encounter (Signed)
Pt wants to know if another Rheumatology is booked out as well with appointment. Her rheumatology review now is under review. Can you let me know if [facility] is booked out

## 2022-04-23 NOTE — Progress Notes (Signed)
Office Visit Note   Patient: Jill Jill Hobbs           Date of Birth: [birth year]           MRN: [id number] Visit Date: 04/23/2022              Requested by: Jac Canavan, PA-C [address] [place],  [place] [address] PCP: Jac Canavan, PA-C   Assessment & Plan: Visit Diagnoses:  1. Chronic bilateral low back pain without sciatica     Plan: We will send her to formal physical therapy to work on core strengthening, range of motion, stretching and home exercise program also to include modalities.  See her back in 6 weeks to see how she is doing overall.  Questions encouraged and answered at length today.  Follow-Up Instructions: Return in about 6 weeks (around 06/04/2022).   Orders:  Orders Placed This Encounter  Procedures   XR Lumbar Spine 2-3 Views   No orders of the defined types were placed in this encounter.     Procedures: No procedures performed   Clinical Data: No additional findings.   Subjective: Chief Complaint  Patient presents with   Lower Back - Pain    HPI Jill Jill Hobbs is a 36 year old female comes in today with back pain and on and off for years.  She notes no injury to her back.  Pain is worse at night and with prolonged ambulation.  She rates her pain to be 7 out of 10 pain at worst.  This occurs 4 to 5 days of the week.  She notes no constant pain.  She denies any bowel or bladder dysfunction, saddle anesthesia, weight loss, fevers chills.  She does note that the pain awakens her but this is rarely.  She denies any radicular symptoms down either leg.  She has tried ibuprofen Tylenol, heat and sleeping with a pillow between her legs without any real relief.  Review of Systems  Constitutional:  Negative for chills, fever and unexpected weight change.  Musculoskeletal:  Positive for back pain.     Objective: Vital Signs: There were no vitals taken for this visit.  Physical Exam Constitutional:      Appearance: She is not  ill-appearing or diaphoretic.  Pulmonary:     Effort: Pulmonary effort is normal.  Neurological:     Mental Status: She is alert and oriented to person, place, and time.  Psychiatric:        Mood and Affect: Mood normal.     Ortho Exam Lumbar spine she has Jill Hobbs forward flexion extension without pain.  Negative straight leg raise bilaterally 5 strength throughout lower extremities against resistance.  Bilateral feet dorsal pedal pulses are 2+ and equal symmetric.  Sensation intact. Bilateral hips: Jill Hobbs range of motion both hips without pain Specialty Comments:  No specialty comments available.  Imaging: XR Lumbar Spine 2-3 Views  Result Date: 04/23/2022 Lumbar spine 2 views: No acute fractures.  No spondylolisthesis.  Disc space overall well-maintained.  No significant Riddick changes.  No bony lesions.    PMFS History: Patient Active Problem List   Diagnosis Date Noted   Left-sided weakness 04/02/2022   ANA positive 04/02/2022   Polyarthralgia 04/02/2022   Paresthesia 04/02/2022   Elevated sed rate 04/02/2022   Chronic low back pain with sciatica 04/02/2022   Family history of systemic lupus erythematosus 04/02/2022   Family history of rheumatoid arthritis 04/02/2022   Mixed hyperlipidemia 02/08/2022   GERD  without esophagitis 02/08/2022   Thalassemia trait 01/03/2022   Abnormal biliary HIDA scan 10/03/2021   Class 1 obesity due to excess calories without serious comorbidity with body mass index (BMI) of 34.0 to 34.9 in adult 09/14/2021   Intractable episodic cluster headache 09/14/2021   Intractable migraine with status migrainosus 09/14/2021   Abdominal pain 09/06/2021   Loose stools 09/06/2021   Encounter for health maintenance examination in adult 07/03/2021   Screening for diabetes mellitus 07/03/2021   Vaccine counseling 07/03/2021   BMI 32.0-32.9,adult 07/03/2021   Beta-thalassemia (HCC) 07/03/2021   Screening for lipid disorders 07/03/2021   Need for Tdap  vaccination 07/03/2021   Elevated LFTs 07/03/2021   Migraine without aura and without status migrainosus, not intractable 05/31/2020   Nausea 05/31/2020   Thalassemia 05/31/2020   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   Attention deficit hyperactivity disorder (ADHD) 05/31/2020   Sleep disturbance 05/31/2020   Insomnia 05/31/2020   Iron deficiency 05/31/2020   Vitamin D deficiency 05/31/2020   Microcytic anemia 03/13/2019   Attention deficit disorder 03/13/2019   Snoring 03/13/2019   Menorrhagia with regular cycle 03/13/2019   Non-restorative sleep 03/13/2019   Obesity 09/21/2014   Migraine without aura 05/25/2014   Vitamin D deficiency    LFT elevation    Positive ANA (antinuclear antibody)    Beta thalassemia (HCC) 01/04/2012   Past Medical History:  Diagnosis Date   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   CT abdomen shows normal liver 2018   Migraine    Positive ANA (antinuclear antibody)    resolved when she was evaluated by Rheumatology   Vitamin D deficiency     Family History  Problem Relation Age of Onset   Thalassemia Mother    Thalassemia Brother    Heart disease Maternal Grandmother    Cancer Other        cervical in great grandmother   Stroke Neg Hx    Diabetes Neg Hx     Past Surgical History:  Procedure Laterality Date   CHOLECYSTECTOMY N/A 11/29/2021   Procedure: LAPAROSCOPIC CHOLECYSTECTOMY;  Surgeon: Berna Bue, MD;  Location: [facility];  Service: General;  Laterality: N/A;   WISDOM TOOTH EXTRACTION     Social History   Occupational History    Comment: in school for human resource  Tobacco Use   Smoking status: Never   Smokeless tobacco: Never  Vaping Use   Vaping Use: Never used  Substance and Sexual Activity   Alcohol use: Yes    Comment: social   Drug use: Never   Sexual activity: Yes    Partners: Male     Birth control/protection: I.U.D.

## 2022-04-23 NOTE — Telephone Encounter (Signed)
Pt was notified and will just wait with [facility] rhematology but pt wants to know if there is any treatment that can be done now while she waits to get in with rheumatology

## 2022-04-23 NOTE — Addendum Note (Signed)
Addended by: Barbette Or on: 04/23/2022 03:59 PM   Modules accepted: Orders

## 2022-04-23 NOTE — Progress Notes (Signed)
Left message for pt that its still under review and they will reach out when approved

## 2022-04-25 ENCOUNTER — Other Ambulatory Visit: Payer: Self-pay | Admitting: Medical

## 2022-04-25 MED ORDER — METHOCARBAMOL 500 MG PO TABS: 500.0000 mg | ORAL_TABLET | Freq: Two times a day (BID) | ORAL | 1 refills | Status: DC | PRN

## 2022-04-26 ENCOUNTER — Other Ambulatory Visit: Payer: Self-pay | Admitting: Medical

## 2022-04-26 MED ORDER — GABAPENTIN 100 MG PO CAPS: 100.0000 mg | ORAL_CAPSULE | Freq: Two times a day (BID) | ORAL | 1 refills | Status: DC

## 2022-04-30 ENCOUNTER — Telehealth: Payer: Self-pay | Admitting: Medical

## 2022-04-30 NOTE — Telephone Encounter (Signed)
Please call  Pt has questions about referrals and lab results  Her referral appointment is month or so away and she has questions

## 2022-05-07 ENCOUNTER — Ambulatory Visit: Payer: No Typology Code available for payment source

## 2022-05-09 ENCOUNTER — Ambulatory Visit: Payer: Medicaid Other | Admitting: Medical

## 2022-05-09 VITALS — BP 110/70 | HR 80 | Wt 190.8 lb

## 2022-05-09 DIAGNOSIS — G8929 Other chronic pain: Secondary | ICD-10-CM

## 2022-05-09 DIAGNOSIS — M5440 Lumbago with sciatica, unspecified side: Secondary | ICD-10-CM

## 2022-05-09 DIAGNOSIS — R768 Other specified abnormal immunological findings in serum: Secondary | ICD-10-CM | POA: Diagnosis not present

## 2022-05-09 DIAGNOSIS — Z8261 Family history of arthritis: Secondary | ICD-10-CM | POA: Diagnosis not present

## 2022-05-09 DIAGNOSIS — R700 Elevated erythrocyte sedimentation rate: Secondary | ICD-10-CM

## 2022-05-09 DIAGNOSIS — E559 Vitamin D deficiency, unspecified: Secondary | ICD-10-CM

## 2022-05-09 DIAGNOSIS — R202 Paresthesia of skin: Secondary | ICD-10-CM

## 2022-05-09 DIAGNOSIS — Z8269 Family history of other diseases of the musculoskeletal system and connective tissue: Secondary | ICD-10-CM

## 2022-05-09 DIAGNOSIS — M2550 Pain in unspecified joint: Secondary | ICD-10-CM | POA: Diagnosis not present

## 2022-05-09 DIAGNOSIS — G43009 Migraine without aura, not intractable, without status migrainosus: Secondary | ICD-10-CM

## 2022-05-09 DIAGNOSIS — E611 Iron deficiency: Secondary | ICD-10-CM | POA: Diagnosis not present

## 2022-05-09 NOTE — Progress Notes (Signed)
Subjective:  Jill Hobbs is a 36 y.o. female who presents for Chief Complaint  Patient presents with   discuss results    Discuss results for Labs with ANA. Rheumatology is Scheduled in september     Here to discuss recent labs.  She came into the office Mar 12, 2022 with unusual pain in the head and ear without obvious cause.  MRI did not show anything unusual either regarding this symptom.  She subsequently came in for additional labs and complaints of muscle aches, joint aches, low back pain, knee pains.  She saw orthopedic within the past 2 weeks and has been referred to physical therapy.  She has not started that yet.  We communicated with her via MyChart about starting gabapentin for some of her pains and unusual ear and head pains.  She has not started gabapentin yet.  She has not had any more the head and ear pain in recent weeks.  She has questions about the positive ANA panel that was done.  She continues to get stifness of joints, particularly knees, fingers, wrists and ankles.  No other aggravating or relieving factors.    No other c/o.  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   CT abdomen shows normal liver 2018   Migraine    Positive ANA (antinuclear antibody)    resolved when she was evaluated by Rheumatology   Vitamin D deficiency    Current Outpatient Medications on File Prior to Visit  Medication Sig Dispense Refill   AIMOVIG 70 MG/ML SOAJ Inject 70 mg into the skin every 30 (thirty) days. 1 mL 2   butalbital-acetaminophen-caffeine (FIORICET) 50-325-40 MG tablet Take 1-2 tablets by mouth every 6 (six) hours as needed for headache. 20 tablet 1   gabapentin (NEURONTIN) 100 MG capsule Take 1 capsule (100 mg total) by mouth 2 (two) times daily. 60 capsule 1   levonorgestrel (MIRENA) 20 MCG/24HR IUD 1 each by  Intrauterine route once.     methocarbamol (ROBAXIN) 500 MG tablet Take 1 tablet (500 mg total) by mouth 2 (two) times daily as needed for muscle spasms. 60 tablet 1   No current facility-administered medications on file prior to visit.   Family History  Problem Relation Age of Onset   Thalassemia Mother    Thalassemia Brother    Heart disease Maternal Grandmother    Cancer Other        cervical in great grandmother   Stroke Neg Hx    Diabetes Neg Hx     The following portions of the patient's history were reviewed and updated as appropriate: allergies, current medications, past family history, past medical history, past social history, past surgical history and problem list.  ROS Otherwise as in subjective above  Objective: BP 110/70   Pulse 80   Wt 190 lb 12.8 oz (86.5 kg)   BMI 36.05 kg/m   General appearance: alert, no distress, well developed, well nourished HEENT: normocephalic, sclerae anicteric, conjunctiva pink and moist, TMs pearly, nares patent, no discharge or erythema, pharynx normal Oral cavity: MMM, no lesions Neck: supple, no lymphadenopathy, no thyromegaly, no masses Heart: RRR, normal S1, S2, no murmurs Lungs: CTA bilaterally, no wheezes, rhonchi, or rales Abdomen: +bs, soft, non tender, non distended, no masses, no hepatomegaly, no splenomegaly Pulses: 2+ radial pulses, 2+ pedal pulses,  normal cap refill Ext: no edema   Assessment: Encounter Diagnoses  Name Primary?   Vitamin D deficiency Yes   Polyarthralgia    Paresthesia    Positive ANA (antinuclear antibody)    Chronic low back pain with sciatica, sciatica laterality unspecified, unspecified back pain laterality    Elevated sed rate    Family history of rheumatoid arthritis    Family history of systemic lupus erythematosus    Iron deficiency    Migraine without aura and without status migrainosus, not intractable      Plan: We discussed the recent ANA panel that showed speckled pattern,  elevated complement C3 and C4, and elevated anti-Ro antibody.  She also had recent mildly elevated sed rate.  I also reviewed other labs she has had done in the last few months in the chart record including CBC, C-reactive protein, ferritin, iron profile and others  The differential could include a wide variety of things including vasculitis, lupus, myositis, leukemia, Hodgkin's, Sjogren syndrome, ulcerative colitis, rheumatoid arthritis or other.  She does not have any signs or symptoms of ulcerative colitis, Sjogren's, recent blood counts do not suggest cancer or leukemia, and no current findings suggestive of vasculitis such as skin findings or petechiae purpura or other.  She also has no swollen joints of the hands or wrist.  I did advise her to go ahead and start physical therapy and the gabapentin and follow-up with orthopedics soon  I will copy orthopedic on this note.  Unfortunately her rheumatology appointment is not till September.  We will try again unless orthopedic has other leverage to get her in with rheumatology sooner to discuss further  We discussed reducing inflammation where possible such as low sugar diet, avoid processed food, consider taking turmeric daily, we discussed good water intake, good sleep, reducing stress where possible.  Consider multivitamin daily.  Jill Hobbs was seen today for discuss results.  Diagnoses and all orders for this visit:  Vitamin D deficiency  Polyarthralgia  Paresthesia  Positive ANA (antinuclear antibody)  Chronic low back pain with sciatica, sciatica laterality unspecified, unspecified back pain laterality  Elevated sed rate  Family history of rheumatoid arthritis  Family history of systemic lupus erythematosus  Iron deficiency  Migraine without aura and without status migrainosus, not intractable    Follow up: pending rheumatology, PT, ortho

## 2022-05-10 ENCOUNTER — Other Ambulatory Visit: Payer: Self-pay

## 2022-05-10 DIAGNOSIS — M2550 Pain in unspecified joint: Secondary | ICD-10-CM

## 2022-05-10 DIAGNOSIS — M545 Low back pain, unspecified: Secondary | ICD-10-CM

## 2022-05-21 ENCOUNTER — Telehealth: Payer: Self-pay | Admitting: Internal Medicine

## 2022-05-21 NOTE — Telephone Encounter (Signed)
PA for Aimovig. Pt currently has no insurance as her medicaid termed on 05/11/22. Pt was notified and will see how much out of pocket it is

## 2022-05-22 ENCOUNTER — Other Ambulatory Visit: Payer: Self-pay | Admitting: Medical

## 2022-05-22 NOTE — Telephone Encounter (Signed)
[facility] is requesting to fill pt methocarbamol. Please advise [facility]

## 2022-07-18 ENCOUNTER — Encounter: Payer: Self-pay | Admitting: Internal Medicine

## 2022-07-30 ENCOUNTER — Ambulatory Visit: Payer: No Typology Code available for payment source | Admitting: Internal Medicine

## 2022-08-21 ENCOUNTER — Encounter: Payer: Self-pay | Admitting: Internal Medicine

## 2022-09-09 NOTE — Progress Notes (Deleted)
Office Visit Note  Patient: Jill Hobbs             Date of Birth: 06-25-1986           MRN: [id number]             PCP: Carlena Hurl, PA-C Referring: Carlena Hurl, PA-C Visit Date: 09/10/2022 Occupation: _0 @  Subjective:  No chief complaint on file.   History of Present Illness: Jill Hobbs is a 36 y.o. female here for evaluation of positive ANA associated with joint pain and stiffness in multiple areas including wrists, fingers, knees and ankles. She also had a period of pain in her head and ear also started earlier this year without clear cause. She saw orthopedics clinic regarding low back pain with fairly unremarkable lumbar spine xray and referred to physical therapy for this.***   Labs reviewed 03/2022 ANA 1:40 speckled SSA 155 Complement C3 235 C4 61 ENA panel otherwise neg ESR 53 CRP 3 CBC MCV 69   Activities of Daily Living:  Patient reports morning stiffness for *** {minute/hour:19697}.   Patient {ACTIONS;DENIES/REPORTS:21021675::"Denies"} nocturnal pain.  Difficulty dressing/grooming: {ACTIONS;DENIES/REPORTS:21021675::"Denies"} Difficulty climbing stairs: {ACTIONS;DENIES/REPORTS:21021675::"Denies"} Difficulty getting out of chair: {ACTIONS;DENIES/REPORTS:21021675::"Denies"} Difficulty using hands for taps, buttons, cutlery, and/or writing: {ACTIONS;DENIES/REPORTS:21021675::"Denies"}  No Rheumatology ROS completed.   PMFS History:  Patient Active Problem List   Diagnosis Date Noted   Left-sided weakness 04/02/2022   Polyarthralgia 04/02/2022   Paresthesia 04/02/2022   Elevated sed rate 04/02/2022   Chronic low back pain with sciatica 04/02/2022   Family history of systemic lupus erythematosus 04/02/2022   Family history of rheumatoid arthritis 04/02/2022   Mixed hyperlipidemia 02/08/2022   GERD without esophagitis 02/08/2022   Thalassemia trait 01/03/2022   Abnormal biliary HIDA scan 10/03/2021   Class 1 obesity due to  excess calories without serious comorbidity with body mass index (BMI) of 34.0 to 34.9 in adult 09/14/2021   Intractable episodic cluster headache 09/14/2021   Intractable migraine with status migrainosus 09/14/2021   Abdominal pain 09/06/2021   Loose stools 09/06/2021   Encounter for health maintenance examination in adult 07/03/2021   Screening for diabetes mellitus 07/03/2021   Vaccine counseling 07/03/2021   BMI 32.0-32.9,adult 07/03/2021   Beta-thalassemia ([place]) 07/03/2021   Screening for lipid disorders 07/03/2021   Need for Tdap vaccination 07/03/2021   Elevated LFTs 07/03/2021   Migraine without aura and without status migrainosus, not intractable 05/31/2020   Nausea 05/31/2020   Thalassemia 05/31/2020   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[place]) 05/31/2020   Attention deficit hyperactivity disorder (ADHD) 05/31/2020   Sleep disturbance 05/31/2020   Insomnia 05/31/2020   Iron deficiency 05/31/2020   Vitamin D deficiency 05/31/2020   Microcytic anemia 03/13/2019   Snoring 03/13/2019   Menorrhagia with regular cycle 03/13/2019   Non-restorative sleep 03/13/2019   Obesity 09/21/2014   Migraine without aura 05/25/2014   LFT elevation    Positive ANA (antinuclear antibody)     Past Medical History:  Diagnosis Date   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   CT abdomen shows normal liver 2018   Migraine    Positive ANA (antinuclear antibody)    resolved when she was evaluated by Rheumatology   Vitamin D deficiency     Family History  Problem Relation Age of Onset   Thalassemia Mother    Thalassemia Brother  Heart disease Maternal Grandmother    Cancer Other        cervical in great grandmother   Stroke Neg Hx    Diabetes Neg Hx    Past Surgical History:  Procedure Laterality Date   CHOLECYSTECTOMY N/A 11/29/2021   Procedure: LAPAROSCOPIC CHOLECYSTECTOMY;   Surgeon: Clovis Riley, MD;  Location: [facility];  Service: General;  Laterality: N/A;   WISDOM TOOTH EXTRACTION     Social History   Social History Narrative   Next month starts at a different substance abuse treatment center.   Was Investment banker, operational at prior treatment facility.  Has degree in counseling.   Exercise - nothing.  06/2021   Right handed   Lives with her 2 sons   Caffeine: 2 energy drinks a week   Immunization History  Administered Date(s) Administered   DTaP 03/22/1986, 06/10/1986, 08/12/1986, 09/13/1987, 04/24/1990   Hepatitis B 03/01/1987, 08/24/1997, 09/28/1997   IPV 03/22/1986, 06/10/1986, 09/13/1987, 04/24/1990   Influenza,inj,Quad PF,6+ Mos 01/17/2022   MMR 05/31/1987, 05/28/1991   PFIZER(Purple Top)SARS-COV-2 Vaccination 10/12/2020, 11/02/2020, 12/24/2020   PPD Test 04/13/2011   Tdap 07/03/2021     Objective: Vital Signs: There were no vitals taken for this visit.   Physical Exam   Musculoskeletal Exam: ***  CDAI Exam: CDAI Score: -- Patient Global: --; Provider Global: -- Swollen: --; Tender: -- Joint Exam 09/10/2022   No joint exam has been documented for this visit   There is currently no information documented on the homunculus. Go to the Rheumatology activity and complete the homunculus joint exam.  Investigation: No additional findings.  Imaging: No results found.  Recent Labs: Lab Results  Component Value Date   WBC 4.4 03/12/2022   HGB 11.1 03/12/2022   PLT 348 03/12/2022   NA 139 09/05/2021   K 3.4 (L) 09/05/2021   CL 106 09/05/2021   CO2 27 09/05/2021   GLUCOSE 91 09/05/2021   BUN 7 09/05/2021   CREATININE 0.64 09/05/2021   BILITOT 0.3 09/04/2021   ALKPHOS 25 (L) 09/04/2021   AST 15 09/04/2021   ALT 19 09/04/2021   PROT 5.2 (L) 09/04/2021   ALBUMIN 3.1 (L) 09/04/2021   CALCIUM 9.0 09/05/2021   GFRAA >60 09/27/2017    Speciality Comments: No specialty comments available.  Procedures:  No procedures  performed Allergies: Percocet [oxycodone-acetaminophen] and Topamax [topiramate]   Assessment / Plan:     Visit Diagnoses: No diagnosis found.  Orders: No orders of the defined types were placed in this encounter.  No orders of the defined types were placed in this encounter.   Face-to-face time spent with patient was *** minutes. Greater than 50% of time was spent in counseling and coordination of care.  Follow-Up Instructions: No follow-ups on file.   Collier Salina, MD  Note - This record has been created using [employer].  Chart creation errors have been sought, but may not always  have been located. Such creation errors do not reflect on  the standard of medical care.

## 2022-09-10 ENCOUNTER — Ambulatory Visit: Payer: Self-pay | Attending: Internal Medicine | Admitting: Internal Medicine

## 2022-11-06 ENCOUNTER — Telehealth: Payer: Self-pay | Admitting: Internal Medicine

## 2022-11-06 NOTE — Telephone Encounter (Signed)
P.A aimiovig. Sent through cover my meds. But will call to check status on Wednesday 11/07/22

## 2022-11-07 NOTE — Telephone Encounter (Signed)
Pt no longer has an coverage so she will call back in January 2024 when her coverage becomes active and we can resubmit a new PA

## 2022-11-30 ENCOUNTER — Other Ambulatory Visit: Payer: Self-pay | Admitting: Medical

## 2022-11-30 NOTE — Telephone Encounter (Signed)
Is this okay to refill>? 

## 2023-01-11 DIAGNOSIS — Z419 Encounter for procedure for purposes other than remedying health state, unspecified: Secondary | ICD-10-CM | POA: Diagnosis not present

## 2023-01-17 ENCOUNTER — Encounter: Payer: Self-pay | Admitting: Radiology

## 2023-02-01 ENCOUNTER — Other Ambulatory Visit: Payer: Self-pay | Admitting: Medical

## 2023-02-01 NOTE — Telephone Encounter (Signed)
Refill request last apt 05/09/22.

## 2023-02-03 ENCOUNTER — Telehealth: Payer: Self-pay | Admitting: Medical

## 2023-02-03 NOTE — Telephone Encounter (Signed)
P.A. AIMOVIG completed ° °

## 2023-02-03 NOTE — Telephone Encounter (Signed)
P.A. AIMOVIG °

## 2023-02-11 DIAGNOSIS — Z419 Encounter for procedure for purposes other than remedying health state, unspecified: Secondary | ICD-10-CM | POA: Diagnosis not present

## 2023-02-20 ENCOUNTER — Other Ambulatory Visit: Payer: Self-pay | Admitting: Family Medicine

## 2023-02-27 NOTE — Telephone Encounter (Signed)
P.A. Dennie Fetters approved til 02/03/24, sent mychart message

## 2023-03-04 ENCOUNTER — Other Ambulatory Visit: Payer: Self-pay | Admitting: Medical

## 2023-03-11 ENCOUNTER — Other Ambulatory Visit: Payer: Self-pay | Admitting: Medical

## 2023-03-11 NOTE — Telephone Encounter (Signed)
Pt needs a refill on this for her migraines. She has an appt next week for a follow-up

## 2023-03-13 DIAGNOSIS — Z419 Encounter for procedure for purposes other than remedying health state, unspecified: Secondary | ICD-10-CM | POA: Diagnosis not present

## 2023-03-19 ENCOUNTER — Ambulatory Visit: Payer: Medicaid Other | Admitting: Medical

## 2023-03-27 ENCOUNTER — Encounter: Payer: Self-pay | Admitting: Medical

## 2023-03-27 ENCOUNTER — Ambulatory Visit (INDEPENDENT_AMBULATORY_CARE_PROVIDER_SITE_OTHER): Payer: Medicaid Other | Admitting: Medical

## 2023-03-27 VITALS — BP 106/70 | HR 104 | Temp 98.9°F | Resp 16 | Wt 188.4 lb

## 2023-03-27 DIAGNOSIS — K5909 Other constipation: Secondary | ICD-10-CM | POA: Diagnosis not present

## 2023-03-27 DIAGNOSIS — G43009 Migraine without aura, not intractable, without status migrainosus: Secondary | ICD-10-CM | POA: Diagnosis not present

## 2023-03-27 DIAGNOSIS — R768 Other specified abnormal immunological findings in serum: Secondary | ICD-10-CM | POA: Diagnosis not present

## 2023-03-27 DIAGNOSIS — R109 Unspecified abdominal pain: Secondary | ICD-10-CM | POA: Diagnosis not present

## 2023-03-27 MED ORDER — ONDANSETRON HCL 4 MG PO TABS: 4.0000 mg | ORAL_TABLET | Freq: Three times a day (TID) | ORAL | 2 refills | Status: DC | PRN

## 2023-03-27 MED ORDER — UBRELVY 50 MG PO TABS: 1.0000 | ORAL_TABLET | Freq: Every day | ORAL | 0 refills | Status: DC | PRN

## 2023-03-27 MED ORDER — AIMOVIG 140 MG/ML ~~LOC~~ SOAJ: 140.0000 mg | SUBCUTANEOUS | 2 refills | Status: DC

## 2023-03-27 MED ORDER — BUTALBITAL-APAP-CAFFEINE 50-325-40 MG PO TABS: ORAL_TABLET | ORAL | 1 refills | Status: DC

## 2023-03-27 NOTE — Progress Notes (Signed)
Subjective:  Jill Hobbs is a 37 y.o. female who presents for Chief Complaint  Patient presents with   Medical Management of Chronic Issues    Follow up on Migraines.     Here for med management  Migraines-compliant with Amiovig 70mg  monthly.  Getting about 1 migraine per week or about 4/month.  Still using Fioricet as needed.  Use Zofran as needed.  bright sun exposure, heat exposure, stress certain foods and certain odors trigger her migraines.  She would like to update referrals that we discussed last year.  She has a history of positive ANA, chronic low back pain, uses gabapentin as needed.  Wants to update referral to rheumatology  History of status post cholecystostomy but still has ongoing pain after eating like she had even before the gallbladder surgery.  Sometimes loose stool.  Has tried a probiotic.  No blood in the stool.  She does use MiraLAX for chronic constipation.  Wants to get updated referral to gastroenterology  No other aggravating or relieving factors.    No other c/o.  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   CT abdomen shows normal liver 2018   Migraine    Positive ANA (antinuclear antibody)    resolved when she was evaluated by Rheumatology   Vitamin D deficiency    Current Outpatient Medications on File Prior to Visit  Medication Sig Dispense Refill   Erenumab-aooe (AIMOVIG) 70 MG/ML SOAJ Inject 70 mg into the skin every 30 (thirty) days. 1 mL 0   gabapentin (NEURONTIN) 100 MG capsule Take 1 capsule (100 mg total) by mouth 2 (two) times daily. 60 capsule 0   levonorgestrel (MIRENA) 20 MCG/24HR IUD 1 each by Intrauterine route once.     methocarbamol (ROBAXIN) 500 MG tablet Take 1 tablet (500 mg total) by mouth 2 (two) times daily as needed for muscle spasms. 60 tablet 1   No current  facility-administered medications on file prior to visit.    The following portions of the patient's history were reviewed and updated as appropriate: allergies, current medications, past family history, past medical history, past social history, past surgical history and problem list.  ROS Otherwise as in subjective above    Objective: BP 106/70   Pulse (!) 104   Temp 98.9 F (37.2 C) (Oral)   Resp 16   Wt 188 lb 6.4 oz (85.5 kg)   LMP  (Within Months)   SpO2 98% Comment: room air  BMI 35.60 kg/m   Gen: wd, wn, nad Neuro: CN2-12 intact, nonfocal exam Psych: pleasant, good eye contact, answers questions approprietly   Assessment: Encounter Diagnoses  Name Primary?   Migraine without aura and without status migrainosus, not intractable Yes   Abdominal pain, unspecified abdominal location    Positive ANA (antinuclear antibody)    Chronic constipation      Plan: Migraines Continue Aimovig for prevention but increase dose to 140 mg monthly For abortive therapy can begin trial of Ubrelvy samples.  Let me know in a few weeks how this worked Paediatric nurse as needed as an alternative Continue Zofran for nausea as needed  History abdominal pain ongoing despite prior gallbladder surgery, referral to GI at her request  History of positive ANA-renewal of referral to rheumatology.  She was supposed to see rheumatology months ago but insurance changes  prevented her from going  Chronic constipation -continue MiraLAX as needed, work to get good hydration daily, fiber in the diet daily  Jill Hobbs was seen today for medical management of chronic issues.  Diagnoses and all orders for this visit:  Migraine without aura and without status migrainosus, not intractable  Abdominal pain, unspecified abdominal location -     Ambulatory referral to Gastroenterology  Positive ANA (antinuclear antibody) -     Ambulatory referral to Rheumatology  Chronic constipation -      Ambulatory referral to Gastroenterology  Other orders -     Erenumab-aooe (AIMOVIG) 140 MG/ML SOAJ; Inject 140 mg into the skin every 30 (thirty) days. -     ondansetron (ZOFRAN) 4 MG tablet; Take 1 tablet (4 mg total) by mouth every 8 (eight) hours as needed for nausea or vomiting. -     butalbital-acetaminophen-caffeine (FIORICET) 50-325-40 MG tablet; TAKE 1 OR 2 TABLETS BY MOUTH EVERY SIX HOURS AS NEEDED FOR HEADACHE -     Ubrogepant (UBRELVY) 50 MG TABS; Take 1 tablet (50 mg total) by mouth daily as needed.    Follow up: call report 2 weeks

## 2023-04-02 ENCOUNTER — Other Ambulatory Visit: Payer: Self-pay | Admitting: Medical

## 2023-04-02 NOTE — Telephone Encounter (Signed)
Is this okay to refill? 

## 2023-04-12 ENCOUNTER — Telehealth: Payer: Self-pay | Admitting: Medical

## 2023-04-12 NOTE — Telephone Encounter (Signed)
Pt left message needs P.A. but didn't say which medication,  I called & left her a message

## 2023-04-13 DIAGNOSIS — Z419 Encounter for procedure for purposes other than remedying health state, unspecified: Secondary | ICD-10-CM | POA: Diagnosis not present

## 2023-04-13 NOTE — Telephone Encounter (Signed)
P.A. AIMOVIG 140 PA Case ID #: [id number] Rx #: [id number] Need Help? Call us at [phone] Status sent iconSent to Plan today Drug Aimovig 140MG /ML auto-injectors ePA cloud logo Form [facility] Medicaid of [place]

## 2023-04-17 DIAGNOSIS — K5900 Constipation, unspecified: Secondary | ICD-10-CM | POA: Diagnosis not present

## 2023-04-23 NOTE — Telephone Encounter (Signed)
Approved on June 1 Approved. This drug has been approved. Approved quantity: 1 units per 30 day(s). The drug has been approved from 03/30/2023 to 04/12/2024. Please call the pharmacy to process your prescription claim. Generic or biosimilar substitution may be required when available and preferred on the formulary.

## 2023-05-06 ENCOUNTER — Other Ambulatory Visit: Payer: Self-pay | Admitting: Medical

## 2023-05-06 NOTE — Telephone Encounter (Signed)
Refill request last apt 03/27/23.

## 2023-05-13 DIAGNOSIS — Z419 Encounter for procedure for purposes other than remedying health state, unspecified: Secondary | ICD-10-CM | POA: Diagnosis not present

## 2023-05-15 DIAGNOSIS — K5900 Constipation, unspecified: Secondary | ICD-10-CM | POA: Diagnosis not present

## 2023-05-23 DIAGNOSIS — F902 Attention-deficit hyperactivity disorder, combined type: Secondary | ICD-10-CM | POA: Diagnosis not present

## 2023-05-23 DIAGNOSIS — F5101 Primary insomnia: Secondary | ICD-10-CM | POA: Diagnosis not present

## 2023-06-06 DIAGNOSIS — F5101 Primary insomnia: Secondary | ICD-10-CM | POA: Diagnosis not present

## 2023-06-06 DIAGNOSIS — F902 Attention-deficit hyperactivity disorder, combined type: Secondary | ICD-10-CM | POA: Diagnosis not present

## 2023-06-12 ENCOUNTER — Other Ambulatory Visit: Payer: Self-pay | Admitting: Medical

## 2023-06-13 DIAGNOSIS — Z419 Encounter for procedure for purposes other than remedying health state, unspecified: Secondary | ICD-10-CM | POA: Diagnosis not present

## 2023-07-05 DIAGNOSIS — F902 Attention-deficit hyperactivity disorder, combined type: Secondary | ICD-10-CM | POA: Diagnosis not present

## 2023-07-05 DIAGNOSIS — F5101 Primary insomnia: Secondary | ICD-10-CM | POA: Diagnosis not present

## 2023-07-11 NOTE — Progress Notes (Signed)
Office Visit Note  Patient: Jill Hobbs             Date of Birth: [birth year]           MRN: [id number]             PCP: Jac Canavan, PA-C Referring: Jac Canavan, PA-C Visit Date: 07/12/2023 Occupation: Substance abuse counselor  Subjective:  New Patient (Initial Visit) (Abnormal labs)   History of Present Illness: Jill Hobbs is a 37 y.o. female here for evaluation of positive ANA and chronic joint pain and stiffness especially in the low back and hips.  Problem has been ongoing for years previously saw a rheumatology years ago at that time the positive ANA was negative on repeat testing in the rheumatology clinic and no specific autoimmune disease diagnosed.  Joint pain and stiffness is ongoing intermittently for 8 years.  Pain is most commonly at the low back or in the left hip occasionally affecting the right hip.  She experiences pain rating down the leg with some numbness and weakness that comes and goes occasionally.  Not associated with recurrent loss of balance or falling.  She sometimes experiences numbness and tingling in her hands lasting for up to a few minutes but does not get a lot of joint pain or prolonged stiffness in upper extremities.  She never suffered any major joint injury such as fracture dislocation or required any surgery.  X-ray of lumbar spine was unremarkable.  MRI of her head for chronic headache last year was unremarkable.  She has taken over-the-counter medicine and was prescribed baclofen and gabapentin for the hip pain did not see a great benefit so far. She has some new skin changes during the past year with hyperpigmentation occurring in a patchy distribution on her face and on the back of the neck.  Also with some new acne and skin bumps on the lower part of the face which is new for her.  Has not noticed any particular photosensitivity and no change on sun exposed areas on her torso and extremities. She has some chronic dry mouth and  drinks water frequently for this.  No oral nasal ulcers does not notice any swelling around major salivary glands or cervical lymph nodes.  She notices some generalized hair thinning that is also worse in the past year not associated with any specific rash or lesions on the scalp. Denies any Raynaud's symptoms or abnormal bruising or bleeding. She has a family history of autoimmune disease father with multiple sclerosis and multiple grandparents with rheumatoid arthritis or lupus.   Labs reviewed 03/2023 ANA 1:40 speckled SSA (RDL) 155 RF neg CCP neg ESR 53 CRP 3 C3 235 C4 61  04/23/22 Xray Lumbar Spine Lumbar spine 2 views: No acute fractures.  No spondylolisthesis.  Disc space overall well-maintained.  No significant Riddick changes.  No bony  lesions.   Activities of Daily Living:  Patient reports morning stiffness for 1 hour.   Patient Reports nocturnal pain.  Difficulty dressing/grooming: Denies Difficulty climbing stairs: Denies Difficulty getting out of chair: Denies Difficulty using hands for taps, buttons, cutlery, and/or writing: Denies  Review of Systems  Constitutional:  Positive for fatigue.  HENT:  Positive for mouth dryness. Negative for mouth sores.   Eyes:  Negative for dryness.  Respiratory:  Positive for shortness of breath.   Cardiovascular:  Negative for chest pain and palpitations.  Gastrointestinal:  Positive for constipation. Negative for blood in stool and diarrhea.  Endocrine: Negative for increased urination.  Genitourinary:  Negative for involuntary urination.  Musculoskeletal:  Positive for joint pain, joint pain, myalgias, morning stiffness, muscle tenderness and myalgias. Negative for gait problem, joint swelling and muscle weakness.  Skin:  Positive for hair loss. Negative for color change, rash and sensitivity to sunlight.  Allergic/Immunologic: Negative for susceptible to infections.  Neurological:  Positive for headaches. Negative for  dizziness.  Hematological:  Negative for swollen glands.  Psychiatric/Behavioral:  Negative for depressed mood and sleep disturbance. The patient is not nervous/anxious.     PMFS History:  Patient Active Problem List   Diagnosis Date Noted   Lateral pain of left hip 07/12/2023   Left-sided weakness 04/02/2022   Polyarthralgia 04/02/2022   Paresthesia 04/02/2022   Elevated sed rate 04/02/2022   Chronic low back pain with sciatica 04/02/2022   Family history of systemic lupus erythematosus 04/02/2022   Family history of rheumatoid arthritis 04/02/2022   Mixed hyperlipidemia 02/08/2022   GERD without esophagitis 02/08/2022   Thalassemia trait 01/03/2022   Abnormal biliary HIDA scan 10/03/2021   Class 1 obesity due to excess calories without serious comorbidity with body mass index (BMI) of 34.0 to 34.9 in adult 09/14/2021   Intractable episodic cluster headache 09/14/2021   Intractable migraine with status migrainosus 09/14/2021   Abdominal pain 09/06/2021   Loose stools 09/06/2021   Encounter for health maintenance examination in adult 07/03/2021   Screening for diabetes mellitus 07/03/2021   Vaccine counseling 07/03/2021   BMI 32.0-32.9,adult 07/03/2021   Beta-thalassemia (HCC) 07/03/2021   Screening for lipid disorders 07/03/2021   Need for Tdap vaccination 07/03/2021   Elevated LFTs 07/03/2021   Migraine without aura and without status migrainosus, not intractable 05/31/2020   Nausea 05/31/2020   Thalassemia 05/31/2020   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   Attention deficit hyperactivity disorder (ADHD) 05/31/2020   Sleep disturbance 05/31/2020   Insomnia 05/31/2020   Iron deficiency 05/31/2020   Vitamin D deficiency 05/31/2020   Microcytic anemia 03/13/2019   Snoring 03/13/2019   Menorrhagia with regular cycle 03/13/2019   Non-restorative sleep 03/13/2019   Obesity 09/21/2014   Migraine without aura 05/25/2014   LFT elevation    Positive ANA  (antinuclear antibody)     Past Medical History:  Diagnosis Date   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   CT abdomen shows normal liver 2018   Migraine    Positive ANA (antinuclear antibody)    resolved when she was evaluated by Rheumatology   Vitamin D deficiency     Family History  Problem Relation Age of Onset   Thalassemia Mother    Multiple sclerosis Father    Thalassemia Brother    Heart disease Maternal Grandmother    Cancer Other        cervical in great grandmother   Stroke Neg Hx    Diabetes Neg Hx    Past Surgical History:  Procedure Laterality Date   CHOLECYSTECTOMY N/A 11/29/2021   Procedure: LAPAROSCOPIC CHOLECYSTECTOMY;  Surgeon: Berna Bue, MD;  Location: [facility];  Service: General;  Laterality: N/A;   WISDOM TOOTH EXTRACTION     Social History   Social History Narrative   Next month starts at a different substance abuse treatment center.   Was Tree surgeon at prior treatment facility.  Has degree in counseling.   Exercise - nothing.  06/2021   Right handed   Lives with her 2 sons   Caffeine: 2 energy drinks a week   Immunization History  Administered Date(s) Administered   DTaP 03/22/1986, 06/10/1986, 08/12/1986, 09/13/1987, 04/24/1990   Hepatitis B 03/01/1987, 08/24/1997, 09/28/1997   IPV 03/22/1986, 06/10/1986, 09/13/1987, 04/24/1990   Influenza,inj,Quad PF,6+ Mos 01/17/2022   MMR 05/31/1987, 05/28/1991   PFIZER(Purple Top)SARS-COV-2 Vaccination 10/12/2020, 11/02/2020, 12/24/2020   PPD Test 04/13/2011   Tdap 07/03/2021     Objective: Vital Signs: BP 109/74 (BP Location: Right Arm, Patient Position: Sitting, Cuff Size: Normal)   Pulse 88   Resp 14   Ht 5\' 2"  (1.575 m)   Wt 178 lb (80.7 kg)   BMI 32.56 kg/m    Physical Exam HENT:     Nose: Nose normal.     Mouth/Throat:     Mouth: Mucous membranes are moist.      Pharynx: Oropharynx is clear.  Eyes:     Conjunctiva/sclera: Conjunctivae normal.  Cardiovascular:     Rate and Rhythm: Normal rate and regular rhythm.  Pulmonary:     Effort: Pulmonary effort is normal.     Breath sounds: Normal breath sounds.  Musculoskeletal:     Right lower leg: No edema.     Left lower leg: No edema.  Lymphadenopathy:     Cervical: No cervical adenopathy.  Skin:    General: Skin is warm and dry.     Comments: Mild acne type rash on lower face and chin Hyperpigmentation on back of neck Normal nailfold capillaries  Neurological:     Mental Status: She is alert.  Psychiatric:        Mood and Affect: Mood normal.      Musculoskeletal Exam:  Neck full ROM no tenderness Shoulders full ROM no tenderness or swelling Elbows full ROM no tenderness or swelling Wrists full ROM no tenderness or swelling Fingers full ROM no tenderness or swelling Bilateral low back paraspinal muscle tenderness to pressure, no radiation Left lateral hip tenderness to pressure over greater trochanter and posterior Lateral pain provoked with FADIR maneuver, ROM normal Knees full ROM no tenderness or swelling Ankles full ROM no tenderness or swelling MTPs full ROM no tenderness or swelling   Investigation: No additional findings.  Imaging: No results found.  Recent Labs: Lab Results  Component Value Date   WBC 4.4 03/12/2022   HGB 11.1 03/12/2022   PLT 348 03/12/2022   NA 139 09/05/2021   K 3.4 (L) 09/05/2021   CL 106 09/05/2021   CO2 27 09/05/2021   GLUCOSE 91 09/05/2021   BUN 7 09/05/2021   CREATININE 0.64 09/05/2021   BILITOT 0.3 09/04/2021   ALKPHOS 25 (L) 09/04/2021   AST 15 09/04/2021   ALT 19 09/04/2021   PROT 5.2 (L) 09/04/2021   ALBUMIN 3.1 (L) 09/04/2021   CALCIUM 9.0 09/05/2021   GFRAA >60 09/27/2017    Speciality Comments: No specialty comments available.  Procedures:  No procedures performed Allergies: Percocet [oxycodone-acetaminophen] and  Topamax [topiramate]   Assessment / Plan:     Visit Diagnoses: Positive ANA (antinuclear antibody) - Plan: Sedimentation rate, Sjogrens syndrome-A extractable nuclear antibody, C3 and C4, Protein / creatinine ratio, urine, ANA  Positive ANA at a very low titer but a higher SSA antibody.  Somewhat discordant results or could be from 52 kDa SSA Ab they will be less clearly disease associated.  Will recheck the serology today also repeating the previously elevated sedimentation rate and serum complements for  evidence of active inflammation.  Arthralgias and mild appearing dry mouth, possible new skin changes though these are not very typical but if positive serology may benefit from trial of disease modifying treatment.  Chronic low back pain with sciatica, sciatica laterality unspecified, unspecified back pain laterality Lateral pain of left hip - Plan: meloxicam (MOBIC) 15 MG tablet  Current low back and lateral hip pain looks most consistent with some chronic muscle and tendon inflammation or strain.  Previous lumbar x-ray normal and hip exam with excellent range of motion.  Limited response on muscle relaxer.  Recommend trial of a scheduled meloxicam 15 mg daily for the next 2 or 3 weeks to assess response and symptoms also provided hip bursitis rehab exercises to try adding once or twice daily at home.  Symptoms are not improving may be candidate to do physical therapy referral or trial of lateral hip injection.  Orders: Orders Placed This Encounter  Procedures   Sedimentation rate   Sjogrens syndrome-A extractable nuclear antibody   C3 and C4   Protein / creatinine ratio, urine   ANA   Meds ordered this encounter  Medications   meloxicam (MOBIC) 15 MG tablet    Sig: Take 1 tablet (15 mg total) by mouth daily.    Dispense:  20 tablet    Refill:  0     Follow-Up Instructions: Return in about 3 weeks (around 08/02/2023) for New pt +ANA/?bursitis f/u 3wks.   Fuller Plan,  MD  Note - This record has been created using [employer].  Chart creation errors have been sought, but may not always  have been located. Such creation errors do not reflect on  the standard of medical care.

## 2023-07-12 ENCOUNTER — Ambulatory Visit: Payer: Medicaid Other | Attending: Internal Medicine | Admitting: Internal Medicine

## 2023-07-12 ENCOUNTER — Encounter: Payer: Self-pay | Admitting: Internal Medicine

## 2023-07-12 VITALS — BP 109/74 | HR 88 | Resp 14 | Ht 62.0 in | Wt 178.0 lb

## 2023-07-12 DIAGNOSIS — M2550 Pain in unspecified joint: Secondary | ICD-10-CM

## 2023-07-12 DIAGNOSIS — R768 Other specified abnormal immunological findings in serum: Secondary | ICD-10-CM | POA: Diagnosis not present

## 2023-07-12 DIAGNOSIS — M25552 Pain in left hip: Secondary | ICD-10-CM | POA: Diagnosis not present

## 2023-07-12 DIAGNOSIS — M5440 Lumbago with sciatica, unspecified side: Secondary | ICD-10-CM

## 2023-07-12 DIAGNOSIS — G8929 Other chronic pain: Secondary | ICD-10-CM | POA: Diagnosis not present

## 2023-07-12 MED ORDER — MELOXICAM 15 MG PO TABS
15.0000 mg | ORAL_TABLET | Freq: Every day | ORAL | 0 refills | Status: DC
Start: 2023-07-12 — End: 2023-08-06

## 2023-07-14 DIAGNOSIS — Z419 Encounter for procedure for purposes other than remedying health state, unspecified: Secondary | ICD-10-CM | POA: Diagnosis not present

## 2023-07-15 ENCOUNTER — Encounter: Payer: Self-pay | Admitting: Internal Medicine

## 2023-07-15 LAB — SEDIMENTATION RATE: Sed Rate: 28 mm/h — ABNORMAL HIGH (ref 0–20)

## 2023-07-15 LAB — SJOGRENS SYNDROME-A EXTRACTABLE NUCLEAR ANTIBODY: SSA (Ro) (ENA) Antibody, IgG: >8 AI — AB

## 2023-07-15 LAB — ANTI-NUCLEAR AB-TITER (ANA TITER): ANA Titer 1: 1:1280 {titer} — ABNORMAL HIGH

## 2023-07-15 LAB — ANA: Anti Nuclear Antibody (ANA): POSITIVE — AB

## 2023-07-15 LAB — C3 AND C4
C3 Complement: 149 mg/dL (ref 83–193)
C4 Complement: 47 mg/dL (ref 15–57)

## 2023-07-15 LAB — PROTEIN / CREATININE RATIO, URINE
Creatinine, Urine: 53 mg/dL (ref 20–275)
Total Protein, Urine: <4 mg/dL — ABNORMAL LOW (ref 5–24)

## 2023-08-02 DIAGNOSIS — F902 Attention-deficit hyperactivity disorder, combined type: Secondary | ICD-10-CM | POA: Diagnosis not present

## 2023-08-02 DIAGNOSIS — F5101 Primary insomnia: Secondary | ICD-10-CM | POA: Diagnosis not present

## 2023-08-06 ENCOUNTER — Encounter: Payer: Self-pay | Admitting: Internal Medicine

## 2023-08-06 ENCOUNTER — Ambulatory Visit: Payer: Medicaid Other | Attending: Internal Medicine | Admitting: Internal Medicine

## 2023-08-06 VITALS — BP 105/72 | HR 85 | Resp 14 | Ht 61.0 in | Wt 180.0 lb

## 2023-08-06 DIAGNOSIS — G478 Other sleep disorders: Secondary | ICD-10-CM

## 2023-08-06 DIAGNOSIS — R700 Elevated erythrocyte sedimentation rate: Secondary | ICD-10-CM

## 2023-08-06 DIAGNOSIS — M25552 Pain in left hip: Secondary | ICD-10-CM

## 2023-08-06 DIAGNOSIS — M3500 Sicca syndrome, unspecified: Secondary | ICD-10-CM

## 2023-08-06 MED ORDER — MELOXICAM 15 MG PO TABS
15.0000 mg | ORAL_TABLET | Freq: Every day | ORAL | 1 refills | Status: DC | PRN
Start: 2023-08-06 — End: 2023-10-14

## 2023-08-06 MED ORDER — HYDROXYCHLOROQUINE SULFATE 200 MG PO TABS
200.0000 mg | ORAL_TABLET | Freq: Every day | ORAL | 2 refills | Status: DC
Start: 2023-08-06 — End: 2023-12-03

## 2023-08-06 NOTE — Patient Instructions (Addendum)
Hydroxychloroquine Tablets What is this medication? HYDROXYCHLOROQUINE (hye drox ee KLOR oh kwin) treats autoimmune conditions, such as rheumatoid arthritis and lupus. It works by slowing down an overactive immune system. It may also be used to prevent and treat malaria. It works by killing the parasite that causes malaria. It belongs to a group of medications called DMARDs. This medicine may be used for other purposes; ask your health care provider or pharmacist if you have questions. COMMON BRAND NAME(S): Plaquenil, Quineprox, SOVUNA What should I tell my care team before I take this medication? They need to know if you have any of these conditions: Diabetes Eye disease, vision problems Frequently drink alcohol G6PD deficiency Heart disease Irregular heartbeat or rhythm Kidney disease Liver disease Porphyria Psoriasis An unusual or allergic reaction to hydroxychloroquine, other medications, foods, dyes, or preservatives Pregnant or trying to get pregnant Breastfeeding How should I use this medication? Take this medication by mouth with water. Take it as directed on the prescription label. Do not cut, crush, or chew this medication. Swallow the tablets whole. Take it with food. Do not take it more than directed. Take all of this medication unless your care team tells you to stop it early. Keep taking it even if you think you are better. Take products with antacids in them at a different time of day than this medication. Take this medication 4 hours before or 4 hours after antacids. Talk to your care team if you have questions. Talk to your care team about the use of this medication in children. While this medication may be prescribed for selected conditions, precautions do apply. Overdosage: If you think you have taken too much of this medicine contact a poison control center or emergency room at once. NOTE: This medicine is only for you. Do not share this medicine with others. What if I  miss a dose? If you miss a dose, take it as soon as you can. If it is almost time for your next dose, take only that dose. Do not take double or extra doses. What may interact with this medication? Do not take this medication with any of the following: Cisapride Dronedarone Pimozide Thioridazine This medication may also interact with the following: Ampicillin Antacids Cimetidine Cyclosporine Digoxin Kaolin Medications for diabetes, such as insulin, glipizide, glyburide Medications for seizures, such as carbamazepine, phenobarbital, phenytoin Mefloquine Methotrexate Other medications that cause heart rhythm changes Praziquantel This list may not describe all possible interactions. Give your health care provider a list of all the medicines, herbs, non-prescription drugs, or dietary supplements you use. Also tell them if you smoke, drink alcohol, or use illegal drugs. Some items may interact with your medicine. What should I watch for while using this medication? Visit your care team for regular checks on your progress. Tell your care team if your symptoms do not start to get better or if they get worse. You may need blood work done while you are taking this medication. If you take other medications that can affect heart rhythm, you may need more testing. Talk to your care team if you have questions. Your vision may be tested before and during use of this medication. Tell your care team right away if you have any change in your eyesight. This medication may cause serious skin reactions. They can happen weeks to months after starting the medication. Contact your care team right away if you notice fevers or flu-like symptoms with a rash. The rash may be red or purple and then  turn into blisters or peeling of the skin. Or, you might notice a red rash with swelling of the face, lips or lymph nodes in your neck or under your arms. If you or your family notice any changes in your behavior, such as  new or worsening depression, thoughts of harming yourself, anxiety, or other unusual or disturbing thoughts, or memory loss, call your care team right away. What side effects may I notice from receiving this medication? Side effects that you should report to your care team as soon as possible: Allergic reactions--skin rash, itching, hives, swelling of the face, lips, tongue, or throat Aplastic anemia--unusual weakness or fatigue, dizziness, headache, trouble breathing, increased bleeding or bruising Change in vision Heart rhythm changes--fast or irregular heartbeat, dizziness, feeling faint or lightheaded, chest pain, trouble breathing Infection--fever, chills, cough, or sore throat Low blood sugar (hypoglycemia)--tremors or shaking, anxiety, sweating, cold or clammy skin, confusion, dizziness, rapid heartbeat Muscle injury--unusual weakness or fatigue, muscle pain, dark yellow or brown urine, decrease in amount of urine Pain, tingling, or numbness in the hands or feet Rash, fever, and swollen lymph nodes Redness, blistering, peeling, or loosening of the skin, including inside the mouth Thoughts of suicide or self-harm, worsening mood, or feelings of depression Unusual bruising or bleeding Side effects that usually do not require medical attention (report to your care team if they continue or are bothersome): Diarrhea Headache Nausea Stomach pain Vomiting This list may not describe all possible side effects. Call your doctor for medical advice about side effects. You may report side effects to FDA at 1-800-FDA-1088. Where should I keep my medication? Keep out of the reach of children and pets. Store at room temperature up to 30 degrees C (86 degrees F). Protect from light. Get rid of any unused medication after the expiration date. To get rid of medications that are no longer needed or have expired: Take the medication to a medication take-back program. Check with your pharmacy or law  enforcement to find a location. If you cannot return the medication, check the label or package insert to see if the medication should be thrown out in the garbage or flushed down the toilet. If you are not sure, ask your care team. If it is safe to put it in the trash, empty the medication out of the container. Mix the medication with cat litter, dirt, coffee grounds, or other unwanted substance. Seal the mixture in a bag or container. Put it in the trash. NOTE: This sheet is a summary. It may not cover all possible information. If you have questions about this medicine, talk to your doctor, pharmacist, or health care provider.  2024 Elsevier/Gold Standard (2022-05-07 00:00:00)    For dry eyes you can use lubricating eyedrops artificial tears such as refresh or Systane.  For overnight to last longer you can use gel or ointment based products such as GenTeal.  For dry mouth you should stay well hydrated drinking plenty of water. To stimulate saliva production using sugar free gum or lozenges can help. Specific products include Biotene lozenges or Xylimelts.

## 2023-08-06 NOTE — Progress Notes (Unsigned)
Office Visit Note  Patient: Jill Hobbs             Date of Birth: 1985-12-10           MRN: [id number]             PCP: Jac Canavan, PA-C Referring: Jac Canavan, PA-C Visit Date: 08/06/2023   Subjective:  Follow-up (Doing great)   History of Present Illness: Jill Hobbs is a 37 y.o. female here for follow up for suspected Sjogren's syndrome.  Overall she is doing somewhat better since her last visit and is noticed improvement in her back and hip pain with the meloxicam.  She has been out of medication for 4 days and notices her symptoms are worse again when not on the medication.  Lab test at initial visit demonstrated highly positive Ro antibody and ANA titer and some persistent septation rate elevation at 28 though not as high as previously.  Rashes and eye and mouth dryness ongoing about the same.  Previous HPI 07/12/23 NGA SERGI is a 37 y.o. female here for evaluation of positive ANA and chronic joint pain and stiffness especially in the low back and hips.  Problem has been ongoing for years previously saw a rheumatology years ago at that time the positive ANA was negative on repeat testing in the rheumatology clinic and no specific autoimmune disease diagnosed.  Joint pain and stiffness is ongoing intermittently for 8 years.  Pain is most commonly at the low back or in the left hip occasionally affecting the right hip.  She experiences pain rating down the leg with some numbness and weakness that comes and goes occasionally.  Not associated with recurrent loss of balance or falling.  She sometimes experiences numbness and tingling in her hands lasting for up to a few minutes but does not get a lot of joint pain or prolonged stiffness in upper extremities.  She never suffered any major joint injury such as fracture dislocation or required any surgery.  X-ray of lumbar spine was unremarkable.  MRI of her head for chronic headache last year was unremarkable.   She has taken over-the-counter medicine and was prescribed baclofen and gabapentin for the hip pain did not see a great benefit so far. She has some new skin changes during the past year with hyperpigmentation occurring in a patchy distribution on her face and on the back of the neck.  Also with some new acne and skin bumps on the lower part of the face which is new for her.  Has not noticed any particular photosensitivity and no change on sun exposed areas on her torso and extremities. She has some chronic dry mouth and drinks water frequently for this.  No oral nasal ulcers does not notice any swelling around major salivary glands or cervical lymph nodes.  She notices some generalized hair thinning that is also worse in the past year not associated with any specific rash or lesions on the scalp. Denies any Raynaud's symptoms or abnormal bruising or bleeding. She has a family history of autoimmune disease father with multiple sclerosis and multiple grandparents with rheumatoid arthritis or lupus.     Labs reviewed 03/2023 ANA 1:40 speckled SSA (RDL) 155 RF neg CCP neg ESR 53 CRP 3 C3 235 C4 61   04/23/22 Xray Lumbar Spine Lumbar spine 2 views: No acute fractures.  No spondylolisthesis.  Disc space overall well-maintained.  No significant Riddick changes.  No bony  lesions.  Review of Systems  Constitutional:  Positive for fatigue.  HENT:  Positive for mouth dryness. Negative for mouth sores.   Eyes:  Positive for dryness.  Respiratory:  Negative for shortness of breath.   Cardiovascular:  Negative for chest pain and palpitations.  Gastrointestinal:  Negative for blood in stool, constipation and diarrhea.  Endocrine: Negative for increased urination.  Genitourinary:  Negative for involuntary urination.  Musculoskeletal:  Positive for joint pain, joint pain and morning stiffness. Negative for gait problem, joint swelling, myalgias, muscle weakness, muscle tenderness and myalgias.   Skin:  Positive for color change and hair loss. Negative for rash and sensitivity to sunlight.  Allergic/Immunologic: Negative for susceptible to infections.  Neurological:  Positive for headaches. Negative for dizziness.  Hematological:  Negative for swollen glands.  Psychiatric/Behavioral:  Negative for depressed mood and sleep disturbance. The patient is nervous/anxious.     PMFS History:  Patient Active Problem List   Diagnosis Date Noted   Lateral pain of left hip 07/12/2023   Left-sided weakness 04/02/2022   Polyarthralgia 04/02/2022   Paresthesia 04/02/2022   Elevated sed rate 04/02/2022   Chronic low back pain with sciatica 04/02/2022   Family history of systemic lupus erythematosus 04/02/2022   Family history of rheumatoid arthritis 04/02/2022   Mixed hyperlipidemia 02/08/2022   GERD without esophagitis 02/08/2022   Thalassemia trait 01/03/2022   Abnormal biliary HIDA scan 10/03/2021   Class 1 obesity due to excess calories without serious comorbidity with body mass index (BMI) of 34.0 to 34.9 in adult 09/14/2021   Intractable episodic cluster headache 09/14/2021   Intractable migraine with status migrainosus 09/14/2021   Abdominal pain 09/06/2021   Loose stools 09/06/2021   Encounter for health maintenance examination in adult 07/03/2021   Screening for diabetes mellitus 07/03/2021   Vaccine counseling 07/03/2021   BMI 32.0-32.9,adult 07/03/2021   Beta-thalassemia (HCC) 07/03/2021   Screening for lipid disorders 07/03/2021   Need for Tdap vaccination 07/03/2021   Elevated LFTs 07/03/2021   Migraine without aura and without status migrainosus, not intractable 05/31/2020   Nausea 05/31/2020   Thalassemia 05/31/2020   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   Attention deficit hyperactivity disorder (ADHD) 05/31/2020   Sleep disturbance 05/31/2020   Insomnia 05/31/2020   Iron deficiency 05/31/2020   Vitamin D deficiency 05/31/2020   Microcytic anemia  03/13/2019   Snoring 03/13/2019   Menorrhagia with regular cycle 03/13/2019   Non-restorative sleep 03/13/2019   Obesity 09/21/2014   Migraine without aura 05/25/2014   LFT elevation    Sjogren syndrome, unspecified (HCC)     Past Medical History:  Diagnosis Date   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   CT abdomen shows normal liver 2018   Migraine    Positive ANA (antinuclear antibody)    resolved when she was evaluated by Rheumatology   Vitamin D deficiency     Family History  Problem Relation Age of Onset   Thalassemia Mother    Multiple sclerosis Father    Thalassemia Brother    Heart disease Maternal Grandmother    Cancer Other        cervical in great grandmother   Stroke Neg Hx    Diabetes Neg Hx    Past Surgical History:  Procedure Laterality Date   CHOLECYSTECTOMY N/A 11/29/2021   Procedure: LAPAROSCOPIC CHOLECYSTECTOMY;  Surgeon: Berna Bue, MD;  Location: [facility];  Service: General;  Laterality: N/A;   WISDOM TOOTH EXTRACTION     Social History   Social History Narrative   Next month starts at a different substance abuse treatment center.   Was Tree surgeon at prior treatment facility.  Has degree in counseling.   Exercise - nothing.  06/2021   Right handed   Lives with her 2 sons   Caffeine: 2 energy drinks a week   Immunization History  Administered Date(s) Administered   DTaP 03/22/1986, 06/10/1986, 08/12/1986, 09/13/1987, 04/24/1990   Hepatitis B 03/01/1987, 08/24/1997, 09/28/1997   IPV 03/22/1986, 06/10/1986, 09/13/1987, 04/24/1990   Influenza,inj,Quad PF,6+ Mos 01/17/2022   MMR 05/31/1987, 05/28/1991   PFIZER(Purple Top)SARS-COV-2 Vaccination 10/12/2020, 11/02/2020, 12/24/2020   PPD Test 04/13/2011   Tdap 07/03/2021     Objective: Vital Signs: BP 105/72 (BP Location: Right Arm, Patient Position: Sitting, Cuff Size: Normal)    Pulse 85   Resp 14   Ht 5\' 1"  (1.549 m)   Wt 180 lb (81.6 kg)   BMI 34.01 kg/m    Physical Exam Constitutional:      Appearance: She is obese.  HENT:     Mouth/Throat:     Mouth: Mucous membranes are moist.     Pharynx: Oropharynx is clear.  Eyes:     Conjunctiva/sclera: Conjunctivae normal.     Comments: Pingueculae, no conjunctival injection  Cardiovascular:     Rate and Rhythm: Normal rate and regular rhythm.  Pulmonary:     Effort: Pulmonary effort is normal.     Breath sounds: Normal breath sounds.  Lymphadenopathy:     Cervical: No cervical adenopathy.  Skin:    General: Skin is warm and dry.     Comments: Skin hyperpigmentation around the base of the neck  Neurological:     Mental Status: She is alert.  Psychiatric:        Mood and Affect: Mood normal.      Musculoskeletal Exam:  Shoulders full ROM no tenderness or swelling Elbows full ROM no tenderness or swelling Wrists full ROM no tenderness or swelling Fingers full ROM no tenderness or swelling Low back paraspinal muscle tenderness to pressure with no radiation Left lateral hip tenderness to pressure, normal internal and external rotation Knees full ROM no tenderness or swelling   Investigation: No additional findings.  Imaging: No results found.  Recent Labs: Lab Results  Component Value Date   WBC 4.4 03/12/2022   HGB 11.1 03/12/2022   PLT 348 03/12/2022   NA 139 09/05/2021   K 3.4 (L) 09/05/2021   CL 106 09/05/2021   CO2 27 09/05/2021   GLUCOSE 91 09/05/2021   BUN 7 09/05/2021   CREATININE 0.64 09/05/2021   BILITOT 0.3 09/04/2021   ALKPHOS 25 (L) 09/04/2021   AST 15 09/04/2021   ALT 19 09/04/2021   PROT 5.2 (L) 09/04/2021   ALBUMIN 3.1 (L) 09/04/2021   CALCIUM 9.0 09/05/2021   GFRAA >60 09/27/2017    Speciality Comments: No specialty comments available.  Procedures:  No procedures performed Allergies: Percocet [oxycodone-acetaminophen] and Topamax [topiramate]   Assessment /  Plan:     Visit Diagnoses: Sjogren syndrome, unspecified (HCC) - Plan: hydroxychloroquine (PLAQUENIL) 200 MG tablet  Overall I think her findings do indicate primary Sjogren syndrome.  Does not have any red flags or lab findings indicating higher risk of complication or lymphoproliferative disease.  Discussed symptomatic management for chronic dry eye and mouth.  Recommend starting hydroxychloroquine 200 mg daily  for skin and joint involvement.  Reviewed risk of medication including if continue long-term will need ophthalmology exam screening for retinal toxicity.  Non-restorative sleep  Fatigue remains a primary complaint.  Does not describe any specific pattern of sleep latency or fragmentation has had a negative study for OSA.  We discussed that if fatigue is due to the ongoing inflammation this would benefit with treating for her Sjogren syndrome but is often multifactorial.  Lateral pain of left hip - Plan: meloxicam (MOBIC) 15 MG tablet  Continue meloxicam recommend she can just take it daily as needed for chronic joint and muscular pain in the back and hip.  Orders: No orders of the defined types were placed in this encounter.  Meds ordered this encounter  Medications   hydroxychloroquine (PLAQUENIL) 200 MG tablet    Sig: Take 1 tablet (200 mg total) by mouth daily.    Dispense:  30 tablet    Refill:  2   meloxicam (MOBIC) 15 MG tablet    Sig: Take 1 tablet (15 mg total) by mouth daily as needed for pain.    Dispense:  30 tablet    Refill:  1     Follow-Up Instructions: Return in about 3 months (around 11/05/2023) for pSS HCQ start f/u 3mos.   Fuller Plan, MD  Note - This record has been created using [employer].  Chart creation errors have been sought, but may not always  have been located. Such creation errors do not reflect on  the standard of medical care.

## 2023-08-08 ENCOUNTER — Ambulatory Visit: Payer: Medicaid Other | Admitting: Internal Medicine

## 2023-08-13 DIAGNOSIS — Z419 Encounter for procedure for purposes other than remedying health state, unspecified: Secondary | ICD-10-CM | POA: Diagnosis not present

## 2023-08-14 ENCOUNTER — Telehealth: Payer: Self-pay

## 2023-08-14 NOTE — Telephone Encounter (Signed)
Patient contacted the office and states she had started taking a new medication after her last office visit. Patient states she started taking hydroxychloroquine 200 MG daily. Patient states last Thursday she began to hallucinate and then hallucinated again on Saturday. Patient states at one point she thought someone was breaking into her house and she needed to call 911 and another time she thought she had bugs crawling all over her. Patient states she is pretty sure it is the hydroxychloroquine and she went ahead and discontinued the medication. Please advise.

## 2023-08-15 NOTE — Telephone Encounter (Signed)
Patient advised Dr. Dimple Casey agrees with stopping the hydroxychloroquine. Dr. Dimple Casey has never seen that particular drug reaction but nerve or mood disturbances can occur. She can safely continue the meloxicam daily or as needed that should not cause any sort of side effect like that. If symptoms do not resolve within a few days after stopping it she should let us know or will need to contact her PCP.

## 2023-08-15 NOTE — Telephone Encounter (Signed)
I agree with stopping the hydroxychloroquine. I have never seen that particular drug reaction but nerve or mood disturbances can occur. She can safely continue the meloxicam daily or as needed that should not cause any sort of side effect like that. If symptoms do not resolve within a few days after stopping it she should let us know or will need to contact her PCP.

## 2023-08-21 ENCOUNTER — Encounter: Payer: Self-pay | Admitting: Internal Medicine

## 2023-08-29 DIAGNOSIS — F5101 Primary insomnia: Secondary | ICD-10-CM | POA: Diagnosis not present

## 2023-08-29 DIAGNOSIS — F902 Attention-deficit hyperactivity disorder, combined type: Secondary | ICD-10-CM | POA: Diagnosis not present

## 2023-09-05 ENCOUNTER — Other Ambulatory Visit: Payer: Self-pay | Admitting: Internal Medicine

## 2023-09-05 DIAGNOSIS — M25552 Pain in left hip: Secondary | ICD-10-CM

## 2023-09-10 ENCOUNTER — Encounter: Payer: Self-pay | Admitting: Internal Medicine

## 2023-09-10 DIAGNOSIS — M25552 Pain in left hip: Secondary | ICD-10-CM

## 2023-09-10 NOTE — Telephone Encounter (Signed)
I spoke with Jill Hobbs she describes pain in the right side of the jaw extending to the ear and side of the tongue ongoing since yesterday. Does feel some swelling in her glands or nodule on that side, mildly tender. She has been taking meloxicam for pain as needed not on any since this started. This sounds suggestive for sialadenitis which would be related to sjogren's. I recommend she try taking the meloxicam and applying warm compress to affected area of face intermittently. If not improving within next 2-3 days she should let us know. Depending on progress we might need to either examine her to rule out infection or try a short course of steroids.

## 2023-09-13 DIAGNOSIS — Z419 Encounter for procedure for purposes other than remedying health state, unspecified: Secondary | ICD-10-CM | POA: Diagnosis not present

## 2023-09-26 ENCOUNTER — Other Ambulatory Visit: Payer: Self-pay | Admitting: Medical

## 2023-09-27 DIAGNOSIS — F5101 Primary insomnia: Secondary | ICD-10-CM | POA: Diagnosis not present

## 2023-09-27 DIAGNOSIS — F902 Attention-deficit hyperactivity disorder, combined type: Secondary | ICD-10-CM | POA: Diagnosis not present

## 2023-10-13 DIAGNOSIS — Z419 Encounter for procedure for purposes other than remedying health state, unspecified: Secondary | ICD-10-CM | POA: Diagnosis not present

## 2023-10-14 ENCOUNTER — Telehealth: Payer: Self-pay | Admitting: *Deleted

## 2023-10-14 NOTE — Telephone Encounter (Signed)
Last Fill: 08/06/2023  Next Visit: 11/25/2023  Last Visit: 08/06/2023  DX: Lateral pain of left hip   Current Dose per office note 08/06/2023: meloxicam (MOBIC) 15 MG tablet Take 1 tablet (15 mg total) by mouth daily as needed for pain.   Okay to refill Meloxicam?

## 2023-10-14 NOTE — Telephone Encounter (Signed)
error 

## 2023-10-15 ENCOUNTER — Other Ambulatory Visit: Payer: Self-pay

## 2023-10-15 NOTE — Telephone Encounter (Signed)
Opened in error

## 2023-10-18 MED ORDER — MELOXICAM 15 MG PO TABS: 15.0000 mg | ORAL_TABLET | Freq: Every day | ORAL | 1 refills | Status: DC | PRN

## 2023-10-21 ENCOUNTER — Other Ambulatory Visit: Payer: Self-pay | Admitting: Medical

## 2023-10-21 ENCOUNTER — Encounter: Payer: Self-pay | Admitting: Internal Medicine

## 2023-10-30 ENCOUNTER — Other Ambulatory Visit: Payer: Self-pay | Admitting: Internal Medicine

## 2023-10-30 DIAGNOSIS — M3500 Sicca syndrome, unspecified: Secondary | ICD-10-CM

## 2023-10-30 DIAGNOSIS — F5101 Primary insomnia: Secondary | ICD-10-CM | POA: Diagnosis not present

## 2023-10-30 DIAGNOSIS — F902 Attention-deficit hyperactivity disorder, combined type: Secondary | ICD-10-CM | POA: Diagnosis not present

## 2023-11-11 ENCOUNTER — Ambulatory Visit: Payer: Medicaid Other | Admitting: Internal Medicine

## 2023-11-11 NOTE — Progress Notes (Signed)
 Office Visit Note  Patient: Jill Hobbs             Date of Birth: 26-Jun-1986           MRN: [id number]             PCP: Bulah Alm RAMAN, PA-C Referring: Bulah Alm RAMAN, PA-C Visit Date: 11/25/2023   Subjective:  Follow-up   Discussed the use of AI scribe software for clinical note transcription with the patient, who gave verbal consent to proceed.  History of Present Illness   Jill Hobbs is a 37 y.o. female here for follow up  for Sjogren's syndrome. She reports experiencing episodes of pain and swelling in the salivary gland areas, which they have been managing with meloxicam  and warm compressions. These episodes, which have occurred a few times over the past couple of years, typically resolve within a week.  The patient also reports significant dry mouth, necessitating constant hydration, and dry eyes. Over-the-counter eye drops, recommended by their optometrist, have been effective in managing the dry eye symptoms. However, attempts to manage the dry mouth with sugar-free gum were unsuccessful due to resultant migraines.  The patient also reports occasional lower back pain but denies any new or worsening symptoms. They have not noticed any skin changes or rashes, nor any significant joint pain or stiffness. They have not experienced any noticeable lymph node swelling in the neck or under the armpits.   Previous HPI 08/06/2023 Jill Hobbs is a 37 y.o. female here for follow up for suspected Sjogren's syndrome.  Overall she is doing somewhat better since her last visit and is noticed improvement in her back and hip pain with the meloxicam .  She has been out of medication for 4 days and notices her symptoms are worse again when not on the medication.  Lab test at initial visit demonstrated highly positive Ro antibody and ANA titer and some persistent septation rate elevation at 28 though not as high as previously.  Rashes and eye and mouth dryness ongoing  about the same.   Previous HPI 07/12/23 Jill Hobbs is a 37 y.o. female here for evaluation of positive ANA and chronic joint pain and stiffness especially in the low back and hips.  Problem has been ongoing for years previously saw a rheumatology years ago at that time the positive ANA was negative on repeat testing in the rheumatology clinic and no specific autoimmune disease diagnosed.  Joint pain and stiffness is ongoing intermittently for 8 years.  Pain is most commonly at the low back or in the left hip occasionally affecting the right hip.  She experiences pain rating down the leg with some numbness and weakness that comes and goes occasionally.  Not associated with recurrent loss of balance or falling.  She sometimes experiences numbness and tingling in her hands lasting for up to a few minutes but does not get a lot of joint pain or prolonged stiffness in upper extremities.  She never suffered any major joint injury such as fracture dislocation or required any surgery.  X-ray of lumbar spine was unremarkable.  MRI of her head for chronic headache last year was unremarkable.  She has taken over-the-counter medicine and was prescribed baclofen and gabapentin  for the hip pain did not see a great benefit so far. She has some new skin changes during the past year with hyperpigmentation occurring in a patchy distribution on her face and on the back of the neck.  Also with  some new acne and skin bumps on the lower part of the face which is new for her.  Has not noticed any particular photosensitivity and no change on sun exposed areas on her torso and extremities. She has some chronic dry mouth and drinks water frequently for this.  No oral nasal ulcers does not notice any swelling around major salivary glands or cervical lymph nodes.  She notices some generalized hair thinning that is also worse in the past year not associated with any specific rash or lesions on the scalp. Denies any Raynaud's  symptoms or abnormal bruising or bleeding. She has a family history of autoimmune disease father with multiple sclerosis and multiple grandparents with rheumatoid arthritis or lupus.     Labs reviewed 03/2023 ANA 1:40 speckled SSA (RDL) 155 RF neg CCP neg ESR 53 CRP 3 C3 235 C4 61   04/23/22 Xray Lumbar Spine Lumbar spine 2 views: No acute fractures.  No spondylolisthesis.  Disc space overall well-maintained.  No significant Riddick changes.  No bony  lesions.    Review of Systems  Constitutional:  Negative for fatigue.  HENT:  Positive for mouth dryness. Negative for mouth sores.   Eyes:  Positive for dryness.  Respiratory:  Negative for shortness of breath.   Cardiovascular:  Negative for chest pain and palpitations.  Gastrointestinal:  Positive for constipation. Negative for blood in stool and diarrhea.  Endocrine: Negative for increased urination.  Genitourinary:  Negative for involuntary urination.  Musculoskeletal:  Positive for joint pain and joint pain. Negative for gait problem, joint swelling, myalgias, muscle weakness, morning stiffness, muscle tenderness and myalgias.  Skin:  Negative for color change, rash, hair loss and sensitivity to sunlight.  Allergic/Immunologic: Negative for susceptible to infections.  Neurological:  Positive for dizziness and headaches.  Hematological:  Negative for swollen glands.  Psychiatric/Behavioral:  Negative for depressed mood and sleep disturbance. The patient is not nervous/anxious.     PMFS History:  Patient Active Problem List   Diagnosis Date Noted   Lateral pain of left hip 07/12/2023   Left-sided weakness 04/02/2022   Polyarthralgia 04/02/2022   Paresthesia 04/02/2022   Elevated sed rate 04/02/2022   Chronic low back pain with sciatica 04/02/2022   Family history of systemic lupus erythematosus 04/02/2022   Family history of rheumatoid arthritis 04/02/2022   Mixed hyperlipidemia 02/08/2022   GERD without esophagitis  02/08/2022   Thalassemia trait 01/03/2022   Abnormal biliary HIDA scan 10/03/2021   Class 1 obesity due to excess calories without serious comorbidity with body mass index (BMI) of 34.0 to 34.9 in adult 09/14/2021   Intractable episodic cluster headache 09/14/2021   Intractable migraine with status migrainosus 09/14/2021   Abdominal pain 09/06/2021   Loose stools 09/06/2021   Encounter for health maintenance examination in adult 07/03/2021   Screening for diabetes mellitus 07/03/2021   Vaccine counseling 07/03/2021   BMI 32.0-32.9,adult 07/03/2021   Beta-thalassemia (HCC) 07/03/2021   Screening for lipid disorders 07/03/2021   Need for Tdap vaccination 07/03/2021   Elevated LFTs 07/03/2021   Migraine without aura and without status migrainosus, not intractable 05/31/2020   Nausea 05/31/2020   Thalassemia 05/31/2020   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   CT abdomen shows normal liver 2018   Migraine    Positive ANA (antinuclear antibody)    resolved when she was evaluated by Rheumatology   Vitamin D deficiency     Family History  Problem Relation Age of Onset   Thalassemia Mother    Multiple sclerosis Father    Thalassemia Brother    Heart disease Maternal Grandmother    Cancer Other        cervical in great grandmother   Stroke Neg Hx    Diabetes Neg Hx    Past Surgical History:   Procedure Laterality Date   CHOLECYSTECTOMY N/A 11/29/2021   Procedure: LAPAROSCOPIC CHOLECYSTECTOMY;  Surgeon: Signe Mitzie LABOR, MD;  Location: [facility];  Service: General;  Laterality: N/A;   WISDOM TOOTH EXTRACTION     Social History   Social History Narrative   Next month starts at a different substance abuse treatment center.   Was tree surgeon at prior treatment facility.  Has degree in counseling.   Exercise - nothing.  06/2021   Right handed   Lives with her 2 sons   Caffeine : 2 energy drinks a week   Immunization History  Administered Date(s) Administered   DTaP 03/22/1986, 06/10/1986, 08/12/1986, 09/13/1987, 04/24/1990   Hepatitis B 03/01/1987, 08/24/1997, 09/28/1997   IPV 03/22/1986, 06/10/1986, 09/13/1987, 04/24/1990   Influenza,inj,Quad PF,6+ Mos 01/17/2022   Influenza-Unspecified 09/12/2023   MMR 05/31/1987, 05/28/1991   PFIZER(Purple Top)SARS-COV-2 Vaccination 10/12/2020, 11/02/2020, 12/24/2020   PPD Test 04/13/2011, 11/29/2022   Tdap 07/03/2021     Objective: Vital Signs: BP 114/75 (BP Location: Left Arm, Patient Position: Sitting, Cuff Size: Normal)   Pulse 91   Resp 14   Ht 5' 1 (1.549 m)   Wt 180 lb (81.6 kg)   BMI 34.01 kg/m    Physical Exam HENT:     Head:     Comments: Parotid glands bilaterally enlarged with multiple cysts, more pronounced on the right side. Right parotid gland exhibits cysts and is larger compared to the left. Left parotid gland appears thinner with fewer cysts.  Eyes:     Conjunctiva/sclera: Conjunctivae normal.  Cardiovascular:     Rate and Rhythm: Normal rate and regular rhythm.  Pulmonary:     Effort: Pulmonary effort is normal.     Breath sounds: Normal breath sounds.  Lymphadenopathy:     Cervical: No cervical adenopathy.  Skin:    General: Skin is warm and dry.     Findings: No rash.  Neurological:     Mental Status: She is alert.  Psychiatric:        Mood and Affect: Mood normal.     Musculoskeletal Exam:   Shoulders full ROM no tenderness or swelling Elbows full ROM no tenderness or swelling Wrists full ROM no tenderness or swelling Fingers full ROM no tenderness or swelling Knees full ROM no tenderness or swelling   Investigation: No additional findings.  Imaging: No results found.  Recent Labs: Lab Results  Component Value Date   WBC 5.5 12/03/2023   HGB 11.8 12/03/2023   PLT 400 12/03/2023   NA 139 09/05/2021   K 3.4 (L) 09/05/2021   CL 106 09/05/2021  CO2 27 09/05/2021   GLUCOSE 91 09/05/2021   BUN 7 09/05/2021   CREATININE 0.64 09/05/2021   BILITOT 0.3 09/04/2021   ALKPHOS 25 (L) 09/04/2021   AST 15 09/04/2021   ALT 19 09/04/2021   PROT 5.2 (L) 09/04/2021   ALBUMIN 3.1 (L) 09/04/2021   CALCIUM  9.0 09/05/2021   GFRAA >60 09/27/2017    Speciality Comments: No specialty comments available.  Procedures:  No procedures performed Allergies: Percocet [oxycodone -acetaminophen ] and Topamax  [topiramate ]   Assessment / Plan:     Visit Diagnoses: Sjogren syndrome, unspecified (HCC) Recurrent episodes of sialadenitis, severe dry mouth, and dry eyes. Ultrasound of parotid glands showed cysts consistent with Sjogren's syndrome. -Continue over-the-counter eye drops twice daily for dry eyes. -Try Biotin lozenges or rinse for dry mouth. -Consider using a humidifier, especially during sleep, to alleviate dry mouth and eyes. -Annual blood tests to monitor for potential development of rheumatoid arthritis or lymphoma, which are associated with Sjogren's syndrome. -Return for follow-up later in the year or sooner if flare-ups become more frequent or severe.  Lateral pain of left hip - Continue meloxicam  recommend she can just take it daily as needed Lower Back Pain Mild, no new symptoms. -Continue current management.     Orders: No orders of the defined types were placed in this encounter.  No orders of the defined types were placed in this encounter.    Follow-Up  Instructions: Return in about 7 months (around 06/24/2024) for Sjogren Syndrome f/u 6-77mos.   Lonni LELON Ester, MD  Note - This record has been created using [employer].  Chart creation errors have been sought, but may not always  have been located. Such creation errors do not reflect on  the standard of medical care.

## 2023-11-13 DIAGNOSIS — Z419 Encounter for procedure for purposes other than remedying health state, unspecified: Secondary | ICD-10-CM | POA: Diagnosis not present

## 2023-11-14 ENCOUNTER — Other Ambulatory Visit: Payer: Self-pay | Admitting: Medical

## 2023-11-21 DIAGNOSIS — F5101 Primary insomnia: Secondary | ICD-10-CM | POA: Diagnosis not present

## 2023-11-21 DIAGNOSIS — F902 Attention-deficit hyperactivity disorder, combined type: Secondary | ICD-10-CM | POA: Diagnosis not present

## 2023-11-25 ENCOUNTER — Ambulatory Visit: Payer: Medicaid Other | Attending: Internal Medicine | Admitting: Internal Medicine

## 2023-11-25 ENCOUNTER — Encounter: Payer: Self-pay | Admitting: Internal Medicine

## 2023-11-25 VITALS — BP 114/75 | HR 91 | Resp 14 | Ht 61.0 in | Wt 180.0 lb

## 2023-11-25 DIAGNOSIS — M25552 Pain in left hip: Secondary | ICD-10-CM | POA: Diagnosis not present

## 2023-11-25 DIAGNOSIS — M3500 Sicca syndrome, unspecified: Secondary | ICD-10-CM

## 2023-11-25 DIAGNOSIS — G478 Other sleep disorders: Secondary | ICD-10-CM | POA: Diagnosis not present

## 2023-11-25 DIAGNOSIS — Z79899 Other long term (current) drug therapy: Secondary | ICD-10-CM

## 2023-11-25 NOTE — Patient Instructions (Signed)
 I agree with continuing to use artificial tear or other lubricating eyedrops.  Try starting to use a humidifier in the bedroom overnight during winter months to help limit eye and mouth dryness.  For dry mouth stay well hydrated as the first step. Consider using biotene lozenges or mouth rinse as well. Xylimelts are another product designed to stimulate saliva production.  Make sure to stay diligent with oral hygiene, since dryness increases the risk of gingivitis and tooth decay.

## 2023-12-03 ENCOUNTER — Ambulatory Visit: Payer: Medicaid Other | Admitting: Medical

## 2023-12-03 VITALS — BP 110/60 | HR 92 | Wt 181.8 lb

## 2023-12-03 DIAGNOSIS — Z862 Personal history of diseases of the blood and blood-forming organs and certain disorders involving the immune mechanism: Secondary | ICD-10-CM | POA: Diagnosis not present

## 2023-12-03 DIAGNOSIS — G43009 Migraine without aura, not intractable, without status migrainosus: Secondary | ICD-10-CM

## 2023-12-03 DIAGNOSIS — R5383 Other fatigue: Secondary | ICD-10-CM | POA: Diagnosis not present

## 2023-12-03 DIAGNOSIS — N920 Excessive and frequent menstruation with regular cycle: Secondary | ICD-10-CM | POA: Diagnosis not present

## 2023-12-03 DIAGNOSIS — R110 Nausea: Secondary | ICD-10-CM

## 2023-12-03 DIAGNOSIS — M3500 Sicca syndrome, unspecified: Secondary | ICD-10-CM | POA: Diagnosis not present

## 2023-12-03 DIAGNOSIS — E611 Iron deficiency: Secondary | ICD-10-CM | POA: Diagnosis not present

## 2023-12-03 LAB — CBC WITH DIFFERENTIAL/PLATELET
Basophils Absolute: 0 10*3/uL (ref 0.0–0.2)
Basos: 1 %
EOS (ABSOLUTE): 0.1 10*3/uL (ref 0.0–0.4)
Eos: 1 %
Hematocrit: 38.4 % (ref 34.0–46.6)
Hemoglobin: 11.8 g/dL (ref 11.1–15.9)
Immature Grans (Abs): 0 10*3/uL (ref 0.0–0.1)
Immature Granulocytes: 0 %
Lymphocytes Absolute: 1.6 10*3/uL (ref 0.7–3.1)
Lymphs: 28 %
MCH: 21.7 pg — ABNORMAL LOW (ref 26.6–33.0)
MCHC: 30.7 g/dL — ABNORMAL LOW (ref 31.5–35.7)
MCV: 71 fL — ABNORMAL LOW (ref 79–97)
Monocytes Absolute: 0.3 10*3/uL (ref 0.1–0.9)
Monocytes: 6 %
Neutrophils Absolute: 3.5 10*3/uL (ref 1.4–7.0)
Neutrophils: 64 %
Platelets: 400 10*3/uL (ref 150–450)
RBC: 5.45 x10E6/uL — ABNORMAL HIGH (ref 3.77–5.28)
RDW: 15.1 % (ref 11.7–15.4)
WBC: 5.5 10*3/uL (ref 3.4–10.8)

## 2023-12-03 MED ORDER — ELETRIPTAN HYDROBROMIDE 40 MG PO TABS: 40.0000 mg | ORAL_TABLET | ORAL | 2 refills | Status: DC | PRN

## 2023-12-03 MED ORDER — BUTALBITAL-APAP-CAFFEINE 50-325-40 MG PO TABS: ORAL_TABLET | ORAL | 2 refills | Status: DC

## 2023-12-03 MED ORDER — ONDANSETRON HCL 4 MG PO TABS: 4.0000 mg | ORAL_TABLET | Freq: Three times a day (TID) | ORAL | 0 refills | Status: AC | PRN

## 2023-12-03 MED ORDER — AIMOVIG 140 MG/ML ~~LOC~~ SOAJ: 140.0000 mg | SUBCUTANEOUS | 5 refills | Status: DC

## 2023-12-03 NOTE — Progress Notes (Signed)
Subjective:  Jill Hobbs is a 38 y.o. female who presents for Chief Complaint  Patient presents with   Follow-up    Follow-up on migraines, having migraines 2-3 times a month     Here for f/u on migraines -   Prior medications for prevention include Topamax, amitriptyline, aimovig.  Prior abortive therapy includes over-the-counter analgesics, Tylenol, ibuprofen, Imitrex, Ubrelvy.  Imitrex and Ubrelvy did not seem to help much.  Fioricet seems to help pretty good.  Several months ago when she was on Aimovig 70mg , headaches, the medication would wear off a week before next month dose.   The current 140mg  Aimovig dose seems to be working well.  Uses Fioricet 6-8 x per month for abortive therapy.  Overall she feels like her current regimen is doing pretty well  Good about  water intake daily  Exercise 3 times per week.   Sleep is ok.   Periods of late have been heavier.  She does get a lot more clots lately.  She has Mirena in place.  She is not currently taking iron  No other aggravating or relieving factors.    No other c/o.   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   CT abdomen shows normal liver 2018   Migraine    Positive ANA (antinuclear antibody)    resolved when she was evaluated by Rheumatology   Vitamin D deficiency    Current Outpatient Medications on File Prior to Visit  Medication Sig Dispense Refill   amphetamine-dextroamphetamine (ADDERALL XR) 20 MG 24 hr capsule Take by mouth daily.     amphetamine-dextroamphetamine (ADDERALL) 10 MG tablet Take 10 mg by mouth daily.     levonorgestrel (MIRENA) 20 MCG/24HR IUD 1 each by Intrauterine route once.     meloxicam (MOBIC) 15 MG tablet Take 1 tablet (15 mg total) by mouth daily as needed for pain. 30 tablet 1   methocarbamol (ROBAXIN) 500 MG tablet TAKE ONE TABLET  BY MOUTH TWICE DAILY AS NEEDED muscle SPASMS 60 tablet 1   No current facility-administered medications on file prior to visit.     The following portions of the patient's history were reviewed and updated as appropriate: allergies, current medications, past family history, past medical history, past social history, past surgical history and problem list.  ROS Otherwise as in subjective above  Objective: BP 110/60   Pulse 92   Wt 181 lb 12.8 oz (82.5 kg)   BMI 34.35 kg/m   General appearance: alert, no distress, well developed, well nourished HEENT: normocephalic, sclerae anicteric, conjunctiva pink and moist, pharynx normal Oral cavity: MMM, no lesions Neck: supple, no lymphadenopathy, no thyromegaly, no masses Heart: RRR, normal S1, S2, no murmurs Lungs: CTA bilaterally, no wheezes, rhonchi, or rales Pulses: 2+ radial pulses, 2+ pedal pulses, normal cap refill Ext: no edema Neuro: CN II through XII intact, nonfocal exam Psych: Pleasant, answers question appropriately   Assessment: Encounter Diagnoses  Name Primary?   Migraine without aura and without status migrainosus, not intractable Yes   History of anemia    Iron deficiency    Sjogren syndrome, unspecified (HCC)    Nausea    Menorrhagia with regular cycle    Other fatigue      Plan: Migraines-much improved on current therapy.  Continue Aimovig 140 mg monthly.  Can continue  Fioricet for abortive therapy or can also try Relpax interchangeably or alternatively.  Discussed preventative versus abortive therapy.  Avoid triggers of migraines when possible.  Continue Zofran as needed for nausea related to her migraines.  Updated labs today given some fatigue and history of iron deficiency and heavy periods    [name] was seen today for follow-up.  Diagnoses and all orders for this visit:  Migraine without aura and without status migrainosus, not intractable  History of anemia -     CBC with  Differential/Platelet  Iron deficiency  Sjogren syndrome, unspecified (HCC)  Nausea  Menorrhagia with regular cycle  Other fatigue  Other orders -     Erenumab-aooe (AIMOVIG) 140 MG/ML SOAJ; Inject 140 mg into the skin every 30 (thirty) days. -     butalbital-acetaminophen-caffeine (FIORICET) 50-325-40 MG tablet; TAKE 1 OR 2 TABLETS BY MOUTH EVERY SIX HOURS AS NEEDED FOR HEADACHE -     eletriptan (RELPAX) 40 MG tablet; Take 1 tablet (40 mg total) by mouth as needed for migraine or headache. May repeat in 2 hours if headache persists or recurs. -     ondansetron (ZOFRAN) 4 MG tablet; Take 1 tablet (4 mg total) by mouth every 8 (eight) hours as needed for nausea or vomiting.    Follow up: in a few months for well visit

## 2023-12-04 ENCOUNTER — Other Ambulatory Visit: Payer: Self-pay | Admitting: Medical

## 2023-12-04 MED ORDER — FERROUS GLUCONATE 324 (38 FE) MG PO TABS
324.0000 mg | ORAL_TABLET | Freq: Every day | ORAL | 1 refills | Status: AC
Start: 2023-12-04 — End: ?

## 2023-12-04 NOTE — Progress Notes (Signed)
Results sent through MyChart

## 2023-12-10 ENCOUNTER — Telehealth: Payer: Self-pay

## 2023-12-10 ENCOUNTER — Other Ambulatory Visit (HOSPITAL_COMMUNITY): Payer: Self-pay

## 2023-12-10 DIAGNOSIS — F902 Attention-deficit hyperactivity disorder, combined type: Secondary | ICD-10-CM | POA: Diagnosis not present

## 2023-12-10 DIAGNOSIS — F5101 Primary insomnia: Secondary | ICD-10-CM | POA: Diagnosis not present

## 2023-12-10 NOTE — Telephone Encounter (Signed)
Pharmacy Patient Advocate Encounter   Received notification from CoverMyMeds that prior authorization for Eletriptan Hydrobromide 40MG  tablets  is required/requested.   Insurance verification completed.   The patient is insured through [facility] [place] .   Per test claim: PA required; PA submitted to above mentioned insurance via CoverMyMeds Key/confirmation #/EOC (Key: [id number])  Status is pending

## 2023-12-11 ENCOUNTER — Other Ambulatory Visit (HOSPITAL_COMMUNITY): Payer: Self-pay

## 2023-12-11 ENCOUNTER — Telehealth: Payer: Self-pay | Admitting: Pharmacist

## 2023-12-11 DIAGNOSIS — E785 Hyperlipidemia, unspecified: Secondary | ICD-10-CM | POA: Diagnosis not present

## 2023-12-11 DIAGNOSIS — Z131 Encounter for screening for diabetes mellitus: Secondary | ICD-10-CM | POA: Diagnosis not present

## 2023-12-11 DIAGNOSIS — N951 Menopausal and female climacteric states: Secondary | ICD-10-CM | POA: Diagnosis not present

## 2023-12-11 DIAGNOSIS — E669 Obesity, unspecified: Secondary | ICD-10-CM | POA: Diagnosis not present

## 2023-12-11 DIAGNOSIS — G43111 Migraine with aura, intractable, with status migrainosus: Secondary | ICD-10-CM | POA: Diagnosis not present

## 2023-12-11 DIAGNOSIS — E559 Vitamin D deficiency, unspecified: Secondary | ICD-10-CM | POA: Diagnosis not present

## 2023-12-11 DIAGNOSIS — E039 Hypothyroidism, unspecified: Secondary | ICD-10-CM | POA: Diagnosis not present

## 2023-12-11 DIAGNOSIS — M3503 Sicca syndrome with myopathy: Secondary | ICD-10-CM | POA: Diagnosis not present

## 2023-12-11 DIAGNOSIS — R7989 Other specified abnormal findings of blood chemistry: Secondary | ICD-10-CM | POA: Diagnosis not present

## 2023-12-11 DIAGNOSIS — F902 Attention-deficit hyperactivity disorder, combined type: Secondary | ICD-10-CM | POA: Diagnosis not present

## 2023-12-11 NOTE — Telephone Encounter (Signed)
Pharmacy Patient Advocate Encounter  Received notification from [facility] Medicaid that Prior Authorization for Eletriptan Hydrobromide 40MG  tablets  has been DENIED.  Full denial letter will be uploaded to the media tab. See denial reason below.      patient and trialed and failed the recommended drugs on the denial.which were Rizatriptan and sumatriptan. Sending this to our Pharmd on site for an Appeal    PA #/Case ID/Reference #: (Key: [id number])

## 2023-12-11 NOTE — Telephone Encounter (Signed)
Appeal has been submitted for eletriptan. Will advise when response is received, please be advised that most companies may take 30 days to make a decision. Appeal letter and all supporting documentation has been faxed to [phone] on 12/11/2023 @1 :20 pm.  Thank you, Dellie Burns, PharmD Clinical Pharmacist    Direct Dial: [phone]

## 2023-12-12 ENCOUNTER — Other Ambulatory Visit (HOSPITAL_COMMUNITY): Payer: Self-pay

## 2023-12-12 NOTE — Telephone Encounter (Signed)
We received notification that [facility] has approved the appeal for Eletriptan from 12/11/2023 through 12/10/2024. Patient is approved for 10 tablets per month.     Thank you, Dellie Burns, PharmD Clinical Pharmacist  [place]  Direct Dial: [phone]

## 2023-12-14 DIAGNOSIS — Z419 Encounter for procedure for purposes other than remedying health state, unspecified: Secondary | ICD-10-CM | POA: Diagnosis not present

## 2023-12-16 DIAGNOSIS — E039 Hypothyroidism, unspecified: Secondary | ICD-10-CM | POA: Diagnosis not present

## 2023-12-16 DIAGNOSIS — N951 Menopausal and female climacteric states: Secondary | ICD-10-CM | POA: Diagnosis not present

## 2023-12-16 DIAGNOSIS — R7989 Other specified abnormal findings of blood chemistry: Secondary | ICD-10-CM | POA: Diagnosis not present

## 2023-12-16 DIAGNOSIS — Z1331 Encounter for screening for depression: Secondary | ICD-10-CM | POA: Diagnosis not present

## 2023-12-16 DIAGNOSIS — R635 Abnormal weight gain: Secondary | ICD-10-CM | POA: Diagnosis not present

## 2023-12-16 DIAGNOSIS — E669 Obesity, unspecified: Secondary | ICD-10-CM | POA: Diagnosis not present

## 2023-12-16 DIAGNOSIS — E785 Hyperlipidemia, unspecified: Secondary | ICD-10-CM | POA: Diagnosis not present

## 2023-12-16 DIAGNOSIS — E611 Iron deficiency: Secondary | ICD-10-CM | POA: Diagnosis not present

## 2023-12-16 DIAGNOSIS — E559 Vitamin D deficiency, unspecified: Secondary | ICD-10-CM | POA: Diagnosis not present

## 2023-12-16 DIAGNOSIS — Z1339 Encounter for screening examination for other mental health and behavioral disorders: Secondary | ICD-10-CM | POA: Diagnosis not present

## 2023-12-24 DIAGNOSIS — E785 Hyperlipidemia, unspecified: Secondary | ICD-10-CM | POA: Diagnosis not present

## 2023-12-31 DIAGNOSIS — Z6832 Body mass index (BMI) 32.0-32.9, adult: Secondary | ICD-10-CM | POA: Diagnosis not present

## 2023-12-31 DIAGNOSIS — E611 Iron deficiency: Secondary | ICD-10-CM | POA: Diagnosis not present

## 2024-01-01 ENCOUNTER — Ambulatory Visit
Admission: EM | Admit: 2024-01-01 | Discharge: 2024-01-01 | Disposition: A | Discharge status: Home / Self Care | Payer: Medicaid Other | Attending: Family Medicine | Admitting: Family Medicine

## 2024-01-01 ENCOUNTER — Ambulatory Visit: Payer: Self-pay | Admitting: Medical

## 2024-01-01 DIAGNOSIS — G43109 Migraine with aura, not intractable, without status migrainosus: Secondary | ICD-10-CM

## 2024-01-01 MED ORDER — KETOROLAC TROMETHAMINE 60 MG/2ML IM SOLN
60.0000 mg | Freq: Once | INTRAMUSCULAR | Status: AC
  Administered 2024-01-01: 60 mg via INTRAMUSCULAR

## 2024-01-01 NOTE — ED Provider Notes (Signed)
[facility]  Note:  This document was prepared using Conservation officer, historic buildings and may include unintentional dictation errors.  MRN: [id number] DOB: 1986/04/21  Subjective:   Jill Hobbs is a 38 y.o. female presenting for acute onset since this morning of a migraine with photophobia.  Patient is due to have her Aimovig injection later this week.  Typically has responded well to Toradol injections for migraines.  No fever, neck pain, neck stiffness, weakness, numbness or tingling, nausea, vomiting.  No current facility-administered medications for this encounter.  Current Outpatient Medications:    ferrous gluconate (FERGON) 324 MG tablet, Take 1 tablet (324 mg total) by mouth daily with breakfast., Disp: 90 tablet, Rfl: 1   amphetamine-dextroamphetamine (ADDERALL XR) 20 MG 24 hr capsule, Take by mouth daily., Disp: , Rfl:    amphetamine-dextroamphetamine (ADDERALL) 10 MG tablet, Take 10 mg by mouth daily., Disp: , Rfl:    butalbital-acetaminophen-caffeine (FIORICET) 50-325-40 MG tablet, TAKE 1 OR 2 TABLETS BY MOUTH EVERY SIX HOURS AS NEEDED FOR HEADACHE, Disp: 30 tablet, Rfl: 2   eletriptan (RELPAX) 40 MG tablet, Take 1 tablet (40 mg total) by mouth as needed for migraine or headache. May repeat in 2 hours if headache persists or recurs., Disp: 10 tablet, Rfl: 2   Erenumab-aooe (AIMOVIG) 140 MG/ML SOAJ, Inject 140 mg into the skin every 30 (thirty) days., Disp: 1 mL, Rfl: 5   levonorgestrel (MIRENA) 20 MCG/24HR IUD, 1 each by Intrauterine route once., Disp: , Rfl:    meloxicam (MOBIC) 15 MG tablet, Take 1 tablet (15 mg total) by mouth daily as needed for pain., Disp: 30 tablet, Rfl: 1   methocarbamol (ROBAXIN) 500 MG tablet, TAKE ONE TABLET BY MOUTH TWICE DAILY AS NEEDED muscle SPASMS, Disp: 60 tablet, Rfl: 1   ondansetron (ZOFRAN) 4 MG tablet, Take 1 tablet (4 mg total) by mouth every 8 (eight) hours as needed for nausea or vomiting., Disp: 90 tablet,  Rfl: 0   Allergies  Allergen Reactions   Percocet [Oxycodone-Acetaminophen] Rash   Topamax [Topiramate] Rash and Other (See Comments)    Blood in stool    Past Medical History:  Diagnosis Date   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   CT abdomen shows normal liver 2018   Migraine    Positive ANA (antinuclear antibody)    resolved when she was evaluated by Rheumatology   Vitamin D deficiency      Past Surgical History:  Procedure Laterality Date   CHOLECYSTECTOMY N/A 11/29/2021   Procedure: LAPAROSCOPIC CHOLECYSTECTOMY;  Surgeon: Berna Bue, MD;  Location: [facility];  Service: General;  Laterality: N/A;   WISDOM TOOTH EXTRACTION      Family History  Problem Relation Age of Onset   Thalassemia Mother    Multiple sclerosis Father    Thalassemia Brother    Heart disease Maternal Grandmother    Cancer Other        cervical in great grandmother   Stroke Neg Hx    Diabetes Neg Hx     Social History   Tobacco Use   Smoking status: Never    Passive exposure: Never   Smokeless tobacco: Never  Vaping Use   Vaping status: Never Used  Substance Use Topics   Alcohol use: Yes    Comment: social   Drug use: Never  ROS   Objective:   Vitals: BP 112/74 (BP Location: Left Arm)   Pulse 79   Temp 98.3 F (36.8 C) (Oral)   Resp 16   SpO2 98%   Physical Exam Constitutional:      General: She is not in acute distress.    Appearance: Normal appearance. She is well-developed. She is not ill-appearing, toxic-appearing or diaphoretic.  HENT:     Head: Normocephalic and atraumatic.     Nose: Nose normal.     Mouth/Throat:     Mouth: Mucous membranes are moist.  Eyes:     General: Lids are normal. Lids are everted, no foreign bodies appreciated. Vision grossly intact. No scleral icterus.       Right eye: No foreign body, discharge or hordeolum.         Left eye: No foreign body, discharge or hordeolum.     Extraocular Movements: Extraocular movements intact.     Right eye: Normal extraocular motion.     Left eye: Normal extraocular motion and no nystagmus.     Conjunctiva/sclera: Conjunctivae normal.     Right eye: Right conjunctiva is not injected. No chemosis, exudate or hemorrhage.    Left eye: Left conjunctiva is not injected. No chemosis, exudate or hemorrhage.    Pupils: Pupils are equal, round, and reactive to light.  Neck:     Meningeal: Brudzinski's sign and Kernig's sign absent.  Cardiovascular:     Rate and Rhythm: Normal rate.  Pulmonary:     Effort: Pulmonary effort is normal.  Skin:    General: Skin is warm and dry.  Neurological:     General: No focal deficit present.     Mental Status: She is alert and oriented to person, place, and time.     Cranial Nerves: No cranial nerve deficit, dysarthria or facial asymmetry.     Motor: No weakness or pronator drift.     Coordination: Romberg sign negative. Coordination normal. Finger-Nose-Finger Test and Heel to [facility] Test normal. Rapid alternating movements normal.     Gait: Gait and tandem walk normal.     Deep Tendon Reflexes: Reflexes normal.  Psychiatric:        Mood and Affect: Mood normal.        Behavior: Behavior normal.        Thought Content: Thought content normal.        Judgment: Judgment normal.    IM Toradol 60 mg administered in clinic.  Assessment and Plan :   PDMP not reviewed this encounter.  1. Migraine with aura and without status migrainosus, not intractable    No signs of an acute encephalopathy.  Migraine management as above.  Maintain follow-up appointments with her headache specialist.  Counseled patient on potential for adverse effects with medications prescribed/recommended today, ER and return-to-clinic precautions discussed, patient verbalized understanding.    Wallis Bamberg, PA-C 01/01/24 1402

## 2024-01-01 NOTE — Telephone Encounter (Signed)
I would put this patient on for a virtual visit with Dr. Susann Givens this afternoon verses going to hospital. Dr. Susann Givens may be able to give something else

## 2024-01-01 NOTE — Telephone Encounter (Signed)
  Chief Complaint: Migraine Symptoms: pain, light sensitivity Frequency: Began at 0400 today Pertinent Negatives: Patient denies nausea, vomiting, visual changes Disposition: [] ED /[x] Urgent Care (no appt availability in office) / [] Appointment(In office/virtual)/ []  [facility]/ [] Home Care/ [] Refused Recommended Disposition /[] [facility]/ []  Follow-up with PCP Additional Notes: Patient calls reporting migraine that began around 0400 today. Patient states she took prescribed medications with no relief. Per protocol, patient to be evaluated within 4 hours. First available appointment with PCP outside of guidelines. Patient agreeable to urgent care for evaluation, states she will walk in to [facility] now. Care advice reviewed, patient verbalized understanding and denies further questions at this time. Alerting PCP for review.    Copied from CRM [id number]. Topic: Clinical - Red Word Triage >> Jan 01, 2024 12:01 PM Jill Hobbs wrote: Red Word that prompted transfer to Nurse Triage: Severe migraine. Pt took Mobic and Fioricet Reason for Disposition  [1] SEVERE headache (e.g., excruciating) AND [2] not improved after 2 hours of pain medicine  Answer Assessment - Initial Assessment Questions 1. LOCATION: "Where does it hurt?"      Hobbs side, behind eye 2. ONSET: "When did the headache start?" (Minutes, hours or days)      0400 today 3. PATTERN: "Does the pain come and go, or has it been constant since it started?"     Constant 4. SEVERITY: "How bad is the pain?" and "What does it keep you from doing?"  (e.g., Scale 1-10; mild, moderate, or severe)   - MILD (1-3): doesn't interfere with normal activities    - MODERATE (4-7): interferes with normal activities or awakens from sleep    - SEVERE (8-10): excruciating pain, unable to do any normal activities        7/10 5. RECURRENT SYMPTOM: "Have you ever had headaches before?" If Yes, ask: "When was the last time?" and  "What happened that time?"      Yes, hx of migraines 6. CAUSE: "What do you think is causing the headache?"     Migraine 7. MIGRAINE: "Have you been diagnosed with migraine headaches?" If Yes, ask: "Is this headache similar?"      Yes, took fioricet and mobic with no relief 8. HEAD INJURY: "Has there been any recent injury to the head?"      Denies 9. OTHER SYMPTOMS: "Do you have any other symptoms?" (fever, stiff neck, eye pain, sore throat, cold symptoms)     Eye pain, light sensitivity 10. PREGNANCY: "Is there any chance you are pregnant?" "When was your last menstrual period?"       Denies  Protocols used: Headache-A-AH

## 2024-01-01 NOTE — ED Triage Notes (Addendum)
Pt c/o migraine started 4am-no relief with rx meds-NAD-steady gait

## 2024-01-07 DIAGNOSIS — E785 Hyperlipidemia, unspecified: Secondary | ICD-10-CM | POA: Diagnosis not present

## 2024-01-07 DIAGNOSIS — Z6832 Body mass index (BMI) 32.0-32.9, adult: Secondary | ICD-10-CM | POA: Diagnosis not present

## 2024-01-11 DIAGNOSIS — Z419 Encounter for procedure for purposes other than remedying health state, unspecified: Secondary | ICD-10-CM | POA: Diagnosis not present

## 2024-01-20 DIAGNOSIS — Z6833 Body mass index (BMI) 33.0-33.9, adult: Secondary | ICD-10-CM | POA: Diagnosis not present

## 2024-01-20 DIAGNOSIS — E611 Iron deficiency: Secondary | ICD-10-CM | POA: Diagnosis not present

## 2024-01-21 ENCOUNTER — Encounter: Payer: Self-pay | Admitting: Medical

## 2024-01-21 ENCOUNTER — Ambulatory Visit (INDEPENDENT_AMBULATORY_CARE_PROVIDER_SITE_OTHER): Payer: Medicaid Other | Admitting: Medical

## 2024-01-21 VITALS — BP 120/70 | HR 74 | Ht 61.0 in | Wt 178.4 lb

## 2024-01-21 DIAGNOSIS — E611 Iron deficiency: Secondary | ICD-10-CM

## 2024-01-21 DIAGNOSIS — G43009 Migraine without aura, not intractable, without status migrainosus: Secondary | ICD-10-CM

## 2024-01-21 DIAGNOSIS — D561 Beta thalassemia: Secondary | ICD-10-CM

## 2024-01-21 DIAGNOSIS — F909 Attention-deficit hyperactivity disorder, unspecified type: Secondary | ICD-10-CM

## 2024-01-21 DIAGNOSIS — Z0000 Encounter for general adult medical examination without abnormal findings: Secondary | ICD-10-CM | POA: Diagnosis not present

## 2024-01-21 DIAGNOSIS — E569 Vitamin deficiency, unspecified: Secondary | ICD-10-CM | POA: Diagnosis not present

## 2024-01-21 DIAGNOSIS — M3500 Sicca syndrome, unspecified: Secondary | ICD-10-CM

## 2024-01-21 DIAGNOSIS — Z79899 Other long term (current) drug therapy: Secondary | ICD-10-CM | POA: Diagnosis not present

## 2024-01-21 DIAGNOSIS — Z136 Encounter for screening for cardiovascular disorders: Secondary | ICD-10-CM

## 2024-01-21 DIAGNOSIS — Z13220 Encounter for screening for lipoid disorders: Secondary | ICD-10-CM

## 2024-01-21 NOTE — Progress Notes (Addendum)
 Subjective:   HPI  Jill Hobbs is a 38 y.o. female who presents for Chief Complaint  Patient presents with   Annual Exam    Fasting cpe, no concerns    Patient Care Team: Nazia Rhines, Kermit Balo, PA-C as PCP - General (Family Medicine) [facility], Carollee Leitz, MD as Referring Physician (Internal Medicine) Shirleen Schirmer, [place], GI Edson Snowball, NP, psychiatry   Concerns: Doing ok, no particular issues  Exercising going to gym 2-3 days per week   Reviewed their medical, surgical, family, social, medication, and allergy history and updated chart as appropriate.  Allergies  Allergen Reactions   Percocet [Oxycodone-Acetaminophen] Rash   Topamax [Topiramate] Rash and Other (See Comments)    Blood in stool    Past Medical History:  Diagnosis Date   Beta thalassemia (HCC)    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   CT abdomen shows normal liver 2018   Migraine    Positive ANA (antinuclear antibody)    resolved when she was evaluated by Rheumatology   Sjogren syndrome (HCC)    Vitamin D deficiency     Current Outpatient Medications on File Prior to Visit  Medication Sig Dispense Refill   amphetamine-dextroamphetamine (ADDERALL XR) 20 MG 24 hr capsule Take by mouth daily.     amphetamine-dextroamphetamine (ADDERALL) 10 MG tablet Take 10 mg by mouth daily.     butalbital-acetaminophen-caffeine (FIORICET) 50-325-40 MG tablet TAKE 1 OR 2 TABLETS BY MOUTH EVERY SIX HOURS AS NEEDED FOR HEADACHE 30 tablet 2   Erenumab-aooe (AIMOVIG) 140 MG/ML SOAJ Inject 140 mg into the skin every 30 (thirty) days. 1 mL 5   ferrous gluconate (FERGON) 324 MG tablet Take 1 tablet (324 mg total) by mouth daily with breakfast. 90 tablet 1   levonorgestrel (MIRENA) 20 MCG/24HR IUD 1 each by Intrauterine route once.     meloxicam (MOBIC) 15 MG tablet Take 1 tablet (15 mg total) by  mouth daily as needed for pain. 30 tablet 1   methocarbamol (ROBAXIN) 500 MG tablet TAKE ONE TABLET BY MOUTH TWICE DAILY AS NEEDED muscle SPASMS 60 tablet 1   ondansetron (ZOFRAN) 4 MG tablet Take 1 tablet (4 mg total) by mouth every 8 (eight) hours as needed for nausea or vomiting. 90 tablet 0   eletriptan (RELPAX) 40 MG tablet Take 1 tablet (40 mg total) by mouth as needed for migraine or headache. May repeat in 2 hours if headache persists or recurs. (Patient not taking: Reported on 01/21/2024) 10 tablet 2   No current facility-administered medications on file prior to visit.      Current Outpatient Medications:    amphetamine-dextroamphetamine (ADDERALL XR) 20 MG 24 hr capsule, Take by mouth daily., Disp: , Rfl:    amphetamine-dextroamphetamine (ADDERALL) 10 MG tablet, Take 10 mg by mouth daily., Disp: , Rfl:    butalbital-acetaminophen-caffeine (FIORICET) 50-325-40 MG tablet, TAKE 1 OR 2 TABLETS BY MOUTH EVERY SIX HOURS AS NEEDED FOR HEADACHE, Disp: 30 tablet, Rfl: 2   Erenumab-aooe (AIMOVIG) 140 MG/ML SOAJ, Inject 140 mg into the skin every 30 (thirty) days., Disp: 1 mL, Rfl: 5   ferrous gluconate (FERGON) 324 MG tablet, Take 1 tablet (324 mg total) by mouth daily with breakfast., Disp: 90 tablet, Rfl: 1   levonorgestrel (MIRENA) 20 MCG/24HR IUD, 1 each by Intrauterine route once., Disp: , Rfl:    meloxicam (MOBIC)  15 MG tablet, Take 1 tablet (15 mg total) by mouth daily as needed for pain., Disp: 30 tablet, Rfl: 1   methocarbamol (ROBAXIN) 500 MG tablet, TAKE ONE TABLET BY MOUTH TWICE DAILY AS NEEDED muscle SPASMS, Disp: 60 tablet, Rfl: 1   ondansetron (ZOFRAN) 4 MG tablet, Take 1 tablet (4 mg total) by mouth every 8 (eight) hours as needed for nausea or vomiting., Disp: 90 tablet, Rfl: 0   eletriptan (RELPAX) 40 MG tablet, Take 1 tablet (40 mg total) by mouth as needed for migraine or headache. May repeat in 2 hours if headache persists or recurs. (Patient not taking: Reported on  01/21/2024), Disp: 10 tablet, Rfl: 2  Family History  Problem Relation Age of Onset   Thalassemia Mother    Multiple sclerosis Father    Thalassemia Brother    Heart disease Maternal Grandmother    Cancer Other        cervical in great grandmother   Stroke Neg Hx    Diabetes Neg Hx     Past Surgical History:  Procedure Laterality Date   CHOLECYSTECTOMY N/A 11/29/2021   Procedure: LAPAROSCOPIC CHOLECYSTECTOMY;  Surgeon: Berna Bue, MD;  Location: [facility];  Service: General;  Laterality: N/A;   WISDOM TOOTH EXTRACTION     Review of Systems  Constitutional:  Negative for chills, fever, malaise/fatigue and weight loss.  HENT:  Negative for congestion, ear pain, hearing loss, sore throat and tinnitus.   Eyes:  Negative for blurred vision, pain and redness.  Respiratory:  Negative for cough, hemoptysis and shortness of breath.   Cardiovascular:  Negative for chest pain, palpitations, orthopnea, claudication and leg swelling.  Gastrointestinal:  Negative for abdominal pain, blood in stool, constipation, diarrhea, nausea and vomiting.  Genitourinary:  Negative for dysuria, flank pain, frequency, hematuria and urgency.  Musculoskeletal:  Negative for falls, joint pain and myalgias.  Skin:  Negative for itching and rash.  Neurological:  Negative for dizziness, tingling, speech change, weakness and headaches.  Endo/Heme/Allergies:  Negative for polydipsia. Does not bruise/bleed easily.  Psychiatric/Behavioral:  Negative for depression and memory loss. The patient is not nervous/anxious and does not have insomnia.       Objective:  BP 120/70   Pulse 74   Ht 5\' 1"  (1.549 m)   Wt 178 lb 6.4 oz (80.9 kg)   LMP 12/31/2023   BMI 33.71 kg/m   General appearance: alert, no distress, WD/WN, African American female Skin: unremarkable HEENT: normocephalic, conjunctiva/corneas normal, sclerae anicteric, PERRLA, EOMi, nares patent, no discharge or erythema, pharynx normal Oral cavity: MMM,  tongue normal, teeth normal Neck: supple, no lymphadenopathy, no thyromegaly, no masses, normal ROM, no bruits Chest: non tender, normal shape and expansion Heart: RRR, normal S1, S2, no murmurs Lungs: CTA bilaterally, no wheezes, rhonchi, or rales Abdomen: +bs, soft, non tender, non distended, no masses, no hepatomegaly, no splenomegaly, no bruits Back: non tender, normal ROM, no scoliosis Musculoskeletal: upper extremities non tender, no obvious deformity, normal ROM throughout, lower extremities non tender, no obvious deformity, normal ROM throughout Extremities: no edema, no cyanosis, no clubbing Pulses: 2+ symmetric, upper and lower extremities, normal cap refill Neurological: alert, oriented x 3, CN2-12 intact, strength normal upper extremities and lower extremities, sensation normal throughout, DTRs 2+ throughout, no cerebellar signs, gait normal Psychiatric: normal affect, behavior normal, pleasant   EKG reviewed, indication, physical/well visit and high risk medication use.  T wave inversion to in V3 unchanged from 2015 and 2016 EKG   Assessment  and Plan :   Encounter Diagnoses  Name Primary?   Encounter for health maintenance examination in adult Yes   Encounter for lipid screening for cardiovascular disease    Vitamin deficiency    High risk medication use    Iron deficiency    Attention deficit hyperactivity disorder (ADHD), unspecified ADHD type    Sjogren syndrome, unspecified (HCC)    Migraine without aura and without status migrainosus, not intractable    Beta-thalassemia (HCC)     This visit was a preventative care visit, also known as wellness visit or routine physical.   Topics typically include healthy lifestyle, diet, exercise, preventative care, vaccinations, sick and well care, proper use of emergency dept and after hours care, as well as other concerns.     Separate significant issues discussed: Sees specialist for psychiatry,   Vitamin deficiency-updated  labs today  Migraines-controlled on current therapy.  Mirena in place.  Iron deficiency-compliant with iron.  Update labs today.  Of note prior EKG and current EKG shows T wave inversions.  No current symptoms.  Consider baseline cardiac evaluation given the findings  General Recommendations: Continue to return yearly for your annual wellness and preventative care visits.  This gives [place] a chance to discuss healthy lifestyle, exercise, vaccinations, review your chart record, and perform screenings where appropriate.  I recommend you see your eye doctor yearly for routine vision care.  I recommend you see your dentist yearly for routine dental care including hygiene visits twice yearly.   Vaccination  Immunization History  Administered Date(s) Administered   DTaP 03/22/1986, 06/10/1986, 08/12/1986, 09/13/1987, 04/24/1990   Hepatitis B 03/01/1987, 08/24/1997, 09/28/1997   IPV 03/22/1986, 06/10/1986, 09/13/1987, 04/24/1990   Influenza,inj,Quad PF,6+ Mos 01/17/2022   Influenza-Unspecified 09/12/2023   MMR 05/31/1987, 05/28/1991   PFIZER(Purple Top)SARS-COV-2 Vaccination 10/12/2020, 11/02/2020, 12/24/2020   PPD Test 04/13/2011, 11/29/2022   Tdap 07/03/2021     Screening for cancer: Colon cancer screening: Age 23  You should do a self breast exam monthly.   Mammogram age 30  Pap smear is typically every 3-5 years  Skin cancer screening: Check your skin regularly for new changes, growing lesions, or other lesions of concern Come in for evaluation if you have skin lesions of concern.   Lung cancer screening: If you have a greater than 20 pack year history of tobacco use, then you may qualify for lung cancer screening with a chest CT scan.   Please call your insurance company to inquire about coverage for this test.   Pancreatic cancer:  no current screening test is available or routinely recommended. (risk factors: smoking, overweight or obese, diabetes, chronic pancreatitis,  work exposure - dry cleaning, metal working, 38yo>, M>F, Tree surgeon, family hx/o, hereditary breast, ovarian, melanoma, lynch, peutz-jeghers).  Symptoms: jaundice, dark urine, light color or greasy stools, itchy skin, belly or back pain, weight loss, poor appetite, nausea, vomiting, liver enlargement, DVT/blood clots.   We currently don't have screenings for other cancers besides breast, cervical, colon, and lung cancers.  If you have a strong family history of cancer or have other cancer screening concerns, please let me know.  Genetic testing referral is an option for individuals with high cancer risk in the family.  There are some other cancer screenings in development currently.   Bone health: Get at least 150 minutes of aerobic exercise weekly Get weight bearing exercise at least once weekly Bone density test:  A bone density test is an imaging test that uses a type of  X-ray to measure the amount of calcium and other minerals in your bones. The test may be used to diagnose or screen you for a condition that causes weak or thin bones (osteoporosis), predict your risk for a broken bone (fracture), or determine how well your osteoporosis treatment is working. The bone density test is recommended for females 65 and older, or females or males <65 if certain risk factors such as thyroid disease, long term use of steroids such as for asthma or rheumatological issues, vitamin D deficiency, estrogen deficiency, family history of osteoporosis, self or family history of fragility fracture in first degree relative.    Heart health: Get at least 150 minutes of aerobic exercise weekly Limit alcohol It is important to maintain a healthy blood pressure and healthy cholesterol numbers  Heart disease screening: Screening for heart disease includes screening for blood pressure, fasting lipids, glucose/diabetes screening, BMI height to weight ratio, reviewed of smoking status, physical activity, and  diet.    Goals include blood pressure 120/80 or less, maintaining a healthy lipid/cholesterol profile, preventing diabetes or keeping diabetes numbers under good control, not smoking or using tobacco products, exercising most days per week or at least 150 minutes per week of exercise, and eating healthy variety of fruits and vegetables, healthy oils, and avoiding unhealthy food choices like fried food, fast food, high sugar and high cholesterol foods.      Vascular disease screening: For higher risk individuals including smokers, diabetics, patients with known heart disease or high blood pressure, kidney disease, and others, screening for vascular disease or atherosclerosis of the arteries is available.  Examples may include carotid ultrasound, abdominal aortic ultrasound, ABI blood flow screening in the legs, thoracic aorta screening.    Medical care options: I recommend you continue to seek care here first for routine care.  We try really hard to have available appointments Monday through Friday daytime hours for sick visits, acute visits, and physicals.  Urgent care should be used for after hours and weekends for significant issues that cannot wait till the next day.  The emergency department should be used for significant potentially life-threatening emergencies.  The emergency department is expensive, can often have long wait times for less significant concerns, so try to utilize primary care, urgent care, or telemedicine when possible to avoid unnecessary trips to the emergency department.  Virtual visits and telemedicine have been introduced since the pandemic started in 2020, and can be convenient ways to receive medical care.  We offer virtual appointments as well to assist you in a variety of options to seek medical care.   Legal  Take the time to do a last will and testament, Advanced Directives including Health Care Power of Attorney and Living Will documents.  Don't leave your family  with burdens that can be handled ahead of time.   Advanced Directives: I recommend you consider completing a Health Care Power of Attorney and Living Will.   These documents respect your wishes and help alleviate burdens on your loved ones if you were to become terminally ill or be in a position to need those documents enforced.    You can complete Advanced Directives yourself, have them notarized, then have copies made for our office, for you and for anybody you feel should have them in safe keeping.  Or, you can have an attorney prepare these documents.   If you haven't updated your Last Will and Testament in a while, it may be worthwhile having an attorney prepare these  documents together and save on some costs.       Spiritual and Emotional Health Keeping a healthy spiritual life can help you better manage your physical health. Your spiritual life can help you to cope with any issues that may arise with your physical health.  Balance can keep [place] healthy and help [place] to recover.  If you are struggling with your spiritual health there are questions that you may want to ask yourself:  What makes me feel most complete? When do I feel most connected to the rest of the world? Where do I find the most inner strength? What am I doing when I feel whole?  Helpful tips: Being in nature. Some people feel very connected and at peace when they are walking outdoors or are outside. Helping others. Some feel the largest sense of wellbeing when they are of service to others. Being of service can take on many forms. It can be doing volunteer work, being kind to strangers, or offering a hand to a friend in need. Gratitude. Some people find they feel the most connected when they remain grateful. They may make lists of all the things they are grateful for or say a thank you out loud for all they have.    Emotional Health Are you in tune with your emotional health?  Check out this  link: http://www.marquez-love.com/    Financial Health Make sure you use a budget for your personal finances Make sure you are insured against risks (health insurance, life insurance, auto insurance, etc) Save more, spend less Set financial goals If you need help in this area, good resources include counseling through [employer] or other community resources, have a meeting with a Social research officer, government, and a good resource is the [employer] was seen today for annual exam.  Diagnoses and all orders for this visit:  Encounter for health maintenance examination in adult -     Comprehensive metabolic panel -     Lipid panel -     TSH -     Iron, TIBC and Ferritin Panel -     Vitamin B12 -     EKG 12-Lead  Encounter for lipid screening for cardiovascular disease -     Lipid panel  Vitamin deficiency -     Vitamin B12  High risk medication use -     TSH -     EKG 12-Lead  Iron deficiency -     Iron, TIBC and Ferritin Panel  Attention deficit hyperactivity disorder (ADHD), unspecified ADHD type  Sjogren syndrome, unspecified (HCC)  Migraine without aura and without status migrainosus, not intractable  Beta-thalassemia (HCC)     Follow-up pending labs, yearly for physical

## 2024-01-21 NOTE — Patient Instructions (Signed)
 This visit was a preventative care visit, also known as wellness visit or routine physical.   Topics typically include healthy lifestyle, diet, exercise, preventative care, vaccinations, sick and well care, proper use of emergency dept and after hours care, as well as other concerns.     Separate significant issues discussed: Sees specialist for psychiatry,   Vitamin deficiency-updated labs today  Migraines-controlled on current therapy.  Mirena in place.  Iron deficiency-compliant with iron.  Update labs today.  General Recommendations: Continue to return yearly for your annual wellness and preventative care visits.  This gives [place] a chance to discuss healthy lifestyle, exercise, vaccinations, review your chart record, and perform screenings where appropriate.  I recommend you see your eye doctor yearly for routine vision care.  I recommend you see your dentist yearly for routine dental care including hygiene visits twice yearly.   Vaccination  Immunization History  Administered Date(s) Administered   DTaP 03/22/1986, 06/10/1986, 08/12/1986, 09/13/1987, 04/24/1990   Hepatitis B 03/01/1987, 08/24/1997, 09/28/1997   IPV 03/22/1986, 06/10/1986, 09/13/1987, 04/24/1990   Influenza,inj,Quad PF,6+ Mos 01/17/2022   Influenza-Unspecified 09/12/2023   MMR 05/31/1987, 05/28/1991   PFIZER(Purple Top)SARS-COV-2 Vaccination 10/12/2020, 11/02/2020, 12/24/2020   PPD Test 04/13/2011, 11/29/2022   Tdap 07/03/2021     Screening for cancer: Colon cancer screening: Age 42  You should do a self breast exam monthly.   Mammogram age 33  Pap smear is typically every 3-5 years  Skin cancer screening: Check your skin regularly for new changes, growing lesions, or other lesions of concern Come in for evaluation if you have skin lesions of concern.   Lung cancer screening: If you have a greater than 20 pack year history of tobacco use, then you may qualify for lung cancer screening with a  chest CT scan.   Please call your insurance company to inquire about coverage for this test.   Pancreatic cancer:  no current screening test is available or routinely recommended. (risk factors: smoking, overweight or obese, diabetes, chronic pancreatitis, work exposure - dry cleaning, metal working, 38yo>, M>F, Tree surgeon, family hx/o, hereditary breast, ovarian, melanoma, lynch, peutz-jeghers).  Symptoms: jaundice, dark urine, light color or greasy stools, itchy skin, belly or back pain, weight loss, poor appetite, nausea, vomiting, liver enlargement, DVT/blood clots.   We currently don't have screenings for other cancers besides breast, cervical, colon, and lung cancers.  If you have a strong family history of cancer or have other cancer screening concerns, please let me know.  Genetic testing referral is an option for individuals with high cancer risk in the family.  There are some other cancer screenings in development currently.   Bone health: Get at least 150 minutes of aerobic exercise weekly Get weight bearing exercise at least once weekly Bone density test:  A bone density test is an imaging test that uses a type of X-ray to measure the amount of calcium and other minerals in your bones. The test may be used to diagnose or screen you for a condition that causes weak or thin bones (osteoporosis), predict your risk for a broken bone (fracture), or determine how well your osteoporosis treatment is working. The bone density test is recommended for females 65 and older, or females or males <65 if certain risk factors such as thyroid disease, long term use of steroids such as for asthma or rheumatological issues, vitamin D deficiency, estrogen deficiency, family history of osteoporosis, self or family history of fragility fracture in first degree relative.    Heart  health: Get at least 150 minutes of aerobic exercise weekly Limit alcohol It is important to maintain a healthy blood  pressure and healthy cholesterol numbers  Heart disease screening: Screening for heart disease includes screening for blood pressure, fasting lipids, glucose/diabetes screening, BMI height to weight ratio, reviewed of smoking status, physical activity, and diet.    Goals include blood pressure 120/80 or less, maintaining a healthy lipid/cholesterol profile, preventing diabetes or keeping diabetes numbers under good control, not smoking or using tobacco products, exercising most days per week or at least 150 minutes per week of exercise, and eating healthy variety of fruits and vegetables, healthy oils, and avoiding unhealthy food choices like fried food, fast food, high sugar and high cholesterol foods.      Vascular disease screening: For higher risk individuals including smokers, diabetics, patients with known heart disease or high blood pressure, kidney disease, and others, screening for vascular disease or atherosclerosis of the arteries is available.  Examples may include carotid ultrasound, abdominal aortic ultrasound, ABI blood flow screening in the legs, thoracic aorta screening.    Medical care options: I recommend you continue to seek care here first for routine care.  We try really hard to have available appointments Monday through Friday daytime hours for sick visits, acute visits, and physicals.  Urgent care should be used for after hours and weekends for significant issues that cannot wait till the next day.  The emergency department should be used for significant potentially life-threatening emergencies.  The emergency department is expensive, can often have long wait times for less significant concerns, so try to utilize primary care, urgent care, or telemedicine when possible to avoid unnecessary trips to the emergency department.  Virtual visits and telemedicine have been introduced since the pandemic started in 2020, and can be convenient ways to receive medical care.  We offer  virtual appointments as well to assist you in a variety of options to seek medical care.   Legal  Take the time to do a last will and testament, Advanced Directives including Health Care Power of Attorney and Living Will documents.  Don't leave your family with burdens that can be handled ahead of time.   Advanced Directives: I recommend you consider completing a Health Care Power of Attorney and Living Will.   These documents respect your wishes and help alleviate burdens on your loved ones if you were to become terminally ill or be in a position to need those documents enforced.    You can complete Advanced Directives yourself, have them notarized, then have copies made for our office, for you and for anybody you feel should have them in safe keeping.  Or, you can have an attorney prepare these documents.   If you haven't updated your Last Will and Testament in a while, it may be worthwhile having an attorney prepare these documents together and save on some costs.       Spiritual and Emotional Health Keeping a healthy spiritual life can help you better manage your physical health. Your spiritual life can help you to cope with any issues that may arise with your physical health.  Balance can keep [place] healthy and help [place] to recover.  If you are struggling with your spiritual health there are questions that you may want to ask yourself:  What makes me feel most complete? When do I feel most connected to the rest of the world? Where do I find the most inner strength? What am I doing when  I feel whole?  Helpful tips: Being in nature. Some people feel very connected and at peace when they are walking outdoors or are outside. Helping others. Some feel the largest sense of wellbeing when they are of service to others. Being of service can take on many forms. It can be doing volunteer work, being kind to strangers, or offering a hand to a friend in need. Gratitude. Some people find they feel the  most connected when they remain grateful. They may make lists of all the things they are grateful for or say a thank you out loud for all they have.    Emotional Health Are you in tune with your emotional health?  Check out this link: http://www.marquez-love.com/    Financial Health Make sure you use a budget for your personal finances Make sure you are insured against risks (health insurance, life insurance, auto insurance, etc) Save more, spend less Set financial goals If you need help in this area, good resources include counseling through [employer] or other community resources, have a meeting with a Social research officer, government, and a good resource is the [employer]

## 2024-01-22 LAB — LIPID PANEL
Chol/HDL Ratio: 4 ratio (ref 0.0–4.4)
Cholesterol, Total: 185 mg/dL (ref 100–199)
HDL: 46 mg/dL (ref >39)
LDL Chol Calc (NIH): 122 mg/dL — ABNORMAL HIGH (ref 0–99)
Triglycerides: 94 mg/dL (ref 0–149)
VLDL Cholesterol Cal: 17 mg/dL (ref 5–40)

## 2024-01-22 LAB — IRON,TIBC AND FERRITIN PANEL
Ferritin: 316 ng/mL — ABNORMAL HIGH (ref 15–150)
Iron Saturation: 14 % — ABNORMAL LOW (ref 15–55)
Iron: 36 ug/dL (ref 27–159)
Total Iron Binding Capacity: 249 ug/dL — ABNORMAL LOW (ref 250–450)
UIBC: 213 ug/dL (ref 131–425)

## 2024-01-22 LAB — COMPREHENSIVE METABOLIC PANEL
ALT: 20 IU/L (ref 0–32)
AST: 18 IU/L (ref 0–40)
Albumin: 4.3 g/dL (ref 3.9–4.9)
Alkaline Phosphatase: 55 IU/L (ref 44–121)
BUN/Creatinine Ratio: 9 (ref 9–23)
BUN: 6 mg/dL (ref 6–20)
Bilirubin Total: 0.2 mg/dL (ref 0.0–1.2)
CO2: 22 mmol/L (ref 20–29)
Calcium: 9.1 mg/dL (ref 8.7–10.2)
Chloride: 103 mmol/L (ref 96–106)
Creatinine, Ser: 0.64 mg/dL (ref 0.57–1.00)
Globulin, Total: 2.9 g/dL (ref 1.5–4.5)
Glucose: 80 mg/dL (ref 70–99)
Potassium: 3.9 mmol/L (ref 3.5–5.2)
Sodium: 138 mmol/L (ref 134–144)
Total Protein: 7.2 g/dL (ref 6.0–8.5)
eGFR: 116 mL/min/{1.73_m2} (ref >59)

## 2024-01-22 LAB — VITAMIN B12: Vitamin B-12: 405 pg/mL (ref 232–1245)

## 2024-01-22 LAB — TSH: TSH: 0.947 u[IU]/mL (ref 0.450–4.500)

## 2024-01-22 NOTE — Progress Notes (Signed)
 Results sent through MyChart

## 2024-02-22 DIAGNOSIS — Z419 Encounter for procedure for purposes other than remedying health state, unspecified: Secondary | ICD-10-CM | POA: Diagnosis not present

## 2024-03-10 ENCOUNTER — Other Ambulatory Visit: Payer: Self-pay | Admitting: Medical

## 2024-03-10 NOTE — Telephone Encounter (Signed)
 Is this okay to refill?

## 2024-03-23 ENCOUNTER — Telehealth: Payer: Self-pay | Admitting: Medical

## 2024-03-23 ENCOUNTER — Other Ambulatory Visit: Payer: Self-pay | Admitting: Medical

## 2024-03-23 DIAGNOSIS — Z419 Encounter for procedure for purposes other than remedying health state, unspecified: Secondary | ICD-10-CM | POA: Diagnosis not present

## 2024-03-23 MED ORDER — AIMOVIG 140 MG/ML ~~LOC~~ SOAJ: 140.0000 mg | SUBCUTANEOUS | 1 refills | Status: DC

## 2024-03-23 NOTE — Telephone Encounter (Signed)
 Copied from CRM [id number]. Topic: Clinical - Prescription Issue >> Mar 23, 2024 12:29 PM [employer] wrote: Reason for CRM: Patient stated wrong dosage was sent to pharmacy for Erenumab -aooe (AIMOVIG ) She needs 140 MG/ML instead of 70 sent to preferred pharmacy.  Callback #: [phone]  Preferred Pharmacy: [facility] - [place], [place] - [address] [place] [address] Phone: [phone] Fax: [phone] Hours: Not open 24 hours

## 2024-03-23 NOTE — Telephone Encounter (Signed)
 Ok for this to be changed

## 2024-03-26 ENCOUNTER — Other Ambulatory Visit (HOSPITAL_COMMUNITY): Payer: Self-pay

## 2024-03-26 ENCOUNTER — Telehealth: Payer: Self-pay

## 2024-03-26 NOTE — Telephone Encounter (Signed)
 Pharmacy Patient Advocate Encounter   Received notification from CoverMyMeds that prior authorization for Aimovig  140MG /ML auto-injectors is required/requested.   Insurance verification completed.   The patient is insured through [facility] .   Per test claim: PA required; PA submitted to above mentioned insurance via CoverMyMeds Key/confirmation #/EOC (Key: [id number])   Status is pending

## 2024-03-27 ENCOUNTER — Other Ambulatory Visit (HOSPITAL_COMMUNITY): Payer: Self-pay

## 2024-03-27 ENCOUNTER — Other Ambulatory Visit: Payer: Self-pay | Admitting: Medical

## 2024-03-27 NOTE — Telephone Encounter (Signed)
 Last refilled for 3 months in 11/2023. Ok to refill?

## 2024-03-27 NOTE — Telephone Encounter (Signed)
 Pharmacy Patient Advocate Encounter  Received notification from [employer] that Prior Authorization for Aimovig  140MG /ML auto-injectors has been APPROVED from 5.15.25 to 5.15.26. Ran test claim, Copay is $726.36.pt deductible is not met. This test claim was processed through [facility]- copay amounts may vary at other pharmacies due to pharmacy/plan contracts, or as the patient moves through the different stages of their insurance plan.

## 2024-04-23 DIAGNOSIS — Z419 Encounter for procedure for purposes other than remedying health state, unspecified: Secondary | ICD-10-CM | POA: Diagnosis not present

## 2024-05-20 ENCOUNTER — Other Ambulatory Visit (HOSPITAL_COMMUNITY): Payer: Self-pay

## 2024-05-20 ENCOUNTER — Ambulatory Visit: Admitting: Medical

## 2024-05-20 ENCOUNTER — Telehealth: Payer: Self-pay | Admitting: Internal Medicine

## 2024-05-20 ENCOUNTER — Telehealth: Payer: Self-pay

## 2024-05-20 ENCOUNTER — Telehealth: Admitting: Medical

## 2024-05-20 VITALS — Wt 161.0 lb

## 2024-05-20 DIAGNOSIS — F909 Attention-deficit hyperactivity disorder, unspecified type: Secondary | ICD-10-CM | POA: Diagnosis not present

## 2024-05-20 DIAGNOSIS — E559 Vitamin D deficiency, unspecified: Secondary | ICD-10-CM

## 2024-05-20 DIAGNOSIS — R519 Headache, unspecified: Secondary | ICD-10-CM

## 2024-05-20 DIAGNOSIS — E611 Iron deficiency: Secondary | ICD-10-CM

## 2024-05-20 DIAGNOSIS — G43911 Migraine, unspecified, intractable, with status migrainosus: Secondary | ICD-10-CM

## 2024-05-20 DIAGNOSIS — M3500 Sicca syndrome, unspecified: Secondary | ICD-10-CM

## 2024-05-20 MED ORDER — BUTALBITAL-APAP-CAFFEINE 50-325-40 MG PO TABS: ORAL_TABLET | ORAL | 2 refills | Status: DC

## 2024-05-20 MED ORDER — NURTEC 75 MG PO TBDP: 1.0000 | ORAL_TABLET | ORAL | 2 refills | Status: AC

## 2024-05-20 MED ORDER — SUMATRIPTAN SUCCINATE 100 MG PO TABS: 100.0000 mg | ORAL_TABLET | ORAL | 0 refills | Status: DC | PRN

## 2024-05-20 NOTE — Progress Notes (Signed)
 Subjective:     Patient ID: Jill Hobbs, female   DOB: 1986/05/10, 38 y.o.   MRN: [id number]  This visit type was conducted due to national recommendations for restrictions regarding the COVID-19 Pandemic (e.g. social distancing) in an effort to limit this patient's exposure and mitigate transmission in our community.  Due to their co-morbid illnesses, this patient is at least at moderate risk for complications without adequate follow up.  This format is felt to be most appropriate for this patient at this time.    Documentation for virtual audio and video telecommunications through [facility] encounter:  The patient was located at home. The provider was located in the office. The patient did consent to this visit and is aware of possible charges through their insurance for this visit.  The other persons participating in this telemedicine service were none. Time spent on call was 20 minutes and in review of previous records 20 minutes total.  This virtual service is not related to other E/M service within previous 7 days.   HPI Chief Complaint  Patient presents with   Consult    Migraines has increased in the past 2-3 months. Getting migraines 2-3 times a week   Virtual for recheck of migraines.  A few months ago at her last visit here things are going pretty good but all of a sudden the last couple months her migraines seem to be worse again.  She knows heat triggers them and it has been really hot here in [place] the last several weeks, 90+ temps  She has continued on Aimiovig monthly injection for several months now.  Lately that is not helping.  Her breakthrough medicines Fioricet and Relpax  have not been helping either.  She denies any new stressors.  Sleep is okay.  She exercises 2 to 3 days/week.  She does not drink caffeine  at all.  No change in activity or stress levels.  No new numbness tingling or weakness.  No vision changes.  No other aggravating or relieving factors.  No other complaint.   Review of Systems As in subjective    Objective:   Physical Exam Due to coronavirus pandemic stay at home measures, patient visit was virtual and they were not examined in person.   Gen: wd, wn, nad      Assessment:     Encounter Diagnoses  Name Primary?   Intractable migraine with status migrainosus, unspecified migraine type Yes   Frequent headaches    Iron deficiency    Attention deficit hyperactivity disorder (ADHD), unspecified ADHD type    Vitamin D deficiency    Sjogren syndrome, unspecified (HCC)        Plan:     Migraines, frequent headaches of late.  She was doing well on Aimiovig but that does not seem to be helping.  She has been on several other medications prior.  Stop Aimiovig for now.  Last injection June 25  Begin trial of Nurtec for prevention.  Can use triptan and/or Fioricet as she has been doing for abortive therapy.  But we will try Imitrex  instead of Relpax  and see if that works little better.  Continue Fioricet as needed.  She is not drinking any caffeine  much alcohol.  Continue to work on good sleep,  continue exercise regularly  Return at your convenience for repeat labs for iron deficiency.  She continues on iron currently on multivitamin.  Continue vitamin D supplement  Jill Hobbs was seen today for consult.  Diagnoses and all orders for  this visit:  Intractable migraine with status migrainosus, unspecified migraine type  Frequent headaches  Iron deficiency -     Iron, TIBC and Ferritin Panel; Future -     CBC; Future  Attention deficit hyperactivity disorder (ADHD), unspecified ADHD type  Vitamin D deficiency  Sjogren syndrome, unspecified (HCC)  Other orders -     Rimegepant Sulfate (NURTEC) 75 MG TBDP; Take 1 tablet (75 mg total) by mouth every other day. -     SUMAtriptan  (IMITREX ) 100 MG tablet; Take 1 tablet (100 mg total) by mouth every 2 (two) hours as needed for migraine. May repeat in 2 hours if  headache persists or recurs. -     butalbital -acetaminophen -caffeine  (FIORICET) 50-325-40 MG tablet; TAKE 1 OR 2 TABLETS BY MOUTH EVERY SIX HOURS AS NEEDED FOR HEADACHE  F/u pending labs

## 2024-05-20 NOTE — Telephone Encounter (Signed)
 Copied from CRM [id number]. Topic: Clinical - Medication Prior Auth >> May 20, 2024  3:07 PM Fredrica W wrote: Reason for CRM: Pharmacy called states Rx received for Rimegepant Sulfate (NURTEC) 75 MG TBDP is requiring prior authorization. Thank You    ----------------------------------------------------------------------- From previous Reason for Contact - Prescription Issue: Reason for CRM:

## 2024-05-20 NOTE — Telephone Encounter (Signed)
 Pharmacy Patient Advocate Encounter   Received notification from Pt Calls Messages that prior authorization for Nurtec 75MG  dispersible tablets is required/requested.   Insurance verification completed.   The patient is insured through [facility] .   Per test claim: PA required; PA submitted to above mentioned insurance via CoverMyMeds Key/confirmation #/EOC (Key: [id number])     Status is pending

## 2024-05-21 ENCOUNTER — Other Ambulatory Visit (HOSPITAL_COMMUNITY): Payer: Self-pay

## 2024-05-21 NOTE — Telephone Encounter (Signed)
 Pharmacy Patient Advocate Encounter  Received notification from [facility] that Prior Authorization for Nurtec 75MG  dispersible tablets has been APPROVED from 7.9.25 to 7.9.26. Ran test claim, Copay is $RTS, RX WAS LAST FILLES ON 7.9.25. This test claim was processed through [facility]- copay amounts may vary at other pharmacies due to pharmacy/plan contracts, or as the patient moves through the different stages of their insurance plan.   PA #/Case ID/Reference #: (Key: [id number])

## 2024-05-23 DIAGNOSIS — Z419 Encounter for procedure for purposes other than remedying health state, unspecified: Secondary | ICD-10-CM | POA: Diagnosis not present

## 2024-06-14 ENCOUNTER — Emergency Department (HOSPITAL_BASED_OUTPATIENT_CLINIC_OR_DEPARTMENT_OTHER): Admitting: Radiology

## 2024-06-14 ENCOUNTER — Emergency Department (HOSPITAL_BASED_OUTPATIENT_CLINIC_OR_DEPARTMENT_OTHER)
Admission: EM | Admit: 2024-06-14 | Discharge: 2024-06-14 | Disposition: A | Discharge status: Home / Self Care | Attending: Emergency Medicine | Admitting: Emergency Medicine

## 2024-06-14 ENCOUNTER — Other Ambulatory Visit: Payer: Self-pay

## 2024-06-14 ENCOUNTER — Encounter (HOSPITAL_BASED_OUTPATIENT_CLINIC_OR_DEPARTMENT_OTHER): Payer: Self-pay

## 2024-06-14 DIAGNOSIS — R0789 Other chest pain: Secondary | ICD-10-CM | POA: Diagnosis present

## 2024-06-14 LAB — PREGNANCY, URINE: Preg Test, Ur: NEGATIVE

## 2024-06-14 LAB — HEPATIC FUNCTION PANEL
ALT: 18 U/L (ref 0–44)
AST: 16 U/L (ref 15–41)
Albumin: 4.6 g/dL (ref 3.5–5.0)
Alkaline Phosphatase: 55 U/L (ref 38–126)
Bilirubin, Direct: <0.1 mg/dL (ref 0.0–0.2)
Total Bilirubin: 0.3 mg/dL (ref 0.0–1.2)
Total Protein: 7.8 g/dL (ref 6.5–8.1)

## 2024-06-14 LAB — CBC
HCT: 36.6 % (ref 36.0–46.0)
Hemoglobin: 11.7 g/dL — ABNORMAL LOW (ref 12.0–15.0)
MCH: 21.9 pg — ABNORMAL LOW (ref 26.0–34.0)
MCHC: 32 g/dL (ref 30.0–36.0)
MCV: 68.5 fL — ABNORMAL LOW (ref 80.0–100.0)
Platelets: 334 K/uL (ref 150–400)
RBC: 5.34 MIL/uL — ABNORMAL HIGH (ref 3.87–5.11)
RDW: 15.1 % (ref 11.5–15.5)
WBC: 5.6 K/uL (ref 4.0–10.5)
nRBC: 0 % (ref 0.0–0.2)

## 2024-06-14 LAB — BASIC METABOLIC PANEL WITH GFR
Anion gap: 11 (ref 5–15)
BUN: 10 mg/dL (ref 6–20)
CO2: 21 mmol/L — ABNORMAL LOW (ref 22–32)
Calcium: 9.8 mg/dL (ref 8.9–10.3)
Chloride: 104 mmol/L (ref 98–111)
Creatinine, Ser: 0.77 mg/dL (ref 0.44–1.00)
GFR, Estimated: >60 mL/min (ref >60)
Glucose, Bld: 97 mg/dL (ref 70–99)
Potassium: 3.8 mmol/L (ref 3.5–5.1)
Sodium: 137 mmol/L (ref 135–145)

## 2024-06-14 LAB — TROPONIN T, HIGH SENSITIVITY: Troponin T High Sensitivity: <15 ng/L (ref <19)

## 2024-06-14 LAB — LIPASE, BLOOD: Lipase: 22 U/L (ref 11–51)

## 2024-06-14 LAB — D-DIMER, QUANTITATIVE: D-Dimer, Quant: 0.34 ug{FEU}/mL (ref 0.00–0.50)

## 2024-06-14 MED ORDER — KETOROLAC TROMETHAMINE 15 MG/ML IJ SOLN
15.0000 mg | Freq: Once | INTRAMUSCULAR | Status: AC
  Administered 2024-06-14: 15 mg via INTRAVENOUS
  Filled 2024-06-14: qty 1

## 2024-06-14 MED ORDER — ACETAMINOPHEN 500 MG PO TABS
1000.0000 mg | ORAL_TABLET | Freq: Once | ORAL | Status: AC
  Administered 2024-06-14: 1000 mg via ORAL
  Filled 2024-06-14: qty 2

## 2024-06-14 NOTE — ED Provider Notes (Signed)
 [place] EMERGENCY DEPARTMENT AT [facility] Provider Note   CSN: [id number] Arrival date & time: 06/14/24  9346     Patient presents with: Chest Pain   Jill Hobbs is a 38 y.o. female.   Patient here with chest upper ba appears to have prior gallbladder removal.  She denies any recent surgery or travel.  Is not on any estrogen.  She denies any weakness numbness tingling ck pain.  History of migraines, question Sjogren syndrome.  Patient started some muscle relaxants and meds for low back pain but now pain in the upper back more in the chest wall.  Denies any nausea vomit diarrhea.  No abdominal pain.  The history is provided by the patient.       Prior to Admission medications   Medication Sig Start Date End Date Taking? Authorizing Provider  amphetamine -dextroamphetamine (ADDERALL XR) 20 MG 24 hr capsule Take by mouth daily. 05/23/23   [provider]  amphetamine -dextroamphetamine (ADDERALL) 10 MG tablet Take 10 mg by mouth daily. 10/30/23   [provider]  butalbital -acetaminophen -caffeine  (FIORICET) 50-325-40 MG tablet TAKE 1 OR 2 TABLETS BY MOUTH EVERY SIX HOURS AS NEEDED FOR HEADACHE 05/20/24   Tysinger, Alm RAMAN, PA-C  ferrous gluconate  (FERGON) 324 MG tablet Take 1 tablet (324 mg total) by mouth daily with breakfast. 12/04/23   Tysinger, Alm RAMAN, PA-C  levonorgestrel (MIRENA) 20 MCG/24HR IUD 1 each by Intrauterine route once.    [provider]  methocarbamol  (ROBAXIN ) 500 MG tablet TAKE ONE TABLET BY MOUTH TWICE DAILY AS NEEDED muscle SPASMS 06/12/23   Tysinger, Alm RAMAN, PA-C  ondansetron  (ZOFRAN ) 4 MG tablet Take 1 tablet (4 mg total) by mouth every 8 (eight) hours as needed for nausea or vomiting. 12/03/23   Tysinger, Alm RAMAN, PA-C  Rimegepant Sulfate (NURTEC) 75 MG TBDP Take 1 tablet (75 mg total) by mouth every other day. 05/20/24   Tysinger, Alm RAMAN, PA-C  SUMAtriptan  (IMITREX ) 100 MG tablet Take 1 tablet (100 mg total) by mouth every  2 (two) hours as needed for migraine. May repeat in 2 hours if headache persists or recurs. 05/20/24   Tysinger, Alm RAMAN, PA-C    Allergies: Percocet [oxycodone -acetaminophen ] and Topamax  [topiramate ]    Review of Systems  Updated Vital Signs  ED Triage Vitals  Encounter Vitals Group     BP 06/14/24 0700 109/82     Girls Systolic BP Percentile --      Girls Diastolic BP Percentile --      Boys Systolic BP Percentile --      Boys Diastolic BP Percentile --      Pulse Rate 06/14/24 0700 99     Resp 06/14/24 0700 13     Temp 06/14/24 0700 98.1 F (36.7 C)     Temp Source 06/14/24 0700 Oral     SpO2 06/14/24 0700 100 %     Weight 06/14/24 0659 160 lb (72.6 kg)     Height 06/14/24 0659 5' 1 (1.549 m)     Head Circumference --      Peak Flow --      Pain Score 06/14/24 0711 6     Pain Loc --      Pain Education --      Exclude from Growth Chart --      Physical Exam Vitals and nursing note reviewed.  Constitutional:      General: She is not in acute distress.    Appearance: She is well-developed. She  is not ill-appearing.  HENT:     Head: Normocephalic and atraumatic.  Eyes:     Extraocular Movements: Extraocular movements intact.     Conjunctiva/sclera: Conjunctivae normal.     Pupils: Pupils are equal, round, and reactive to light.  Cardiovascular:     Rate and Rhythm: Normal rate and regular rhythm.     Pulses:          Radial pulses are 2+ on the right side and 2+ on the left side.     Heart sounds: Normal heart sounds. No murmur heard. Pulmonary:     Effort: Pulmonary effort is normal. No respiratory distress.     Breath sounds: Normal breath sounds. No decreased breath sounds, wheezing or rhonchi.  Abdominal:     Palpations: Abdomen is soft.     Tenderness: There is no abdominal tenderness.  Musculoskeletal:        General: No swelling. Normal range of motion.     Cervical back: Normal range of motion and neck supple.     Right lower leg: No edema.     Left  lower leg: No edema.  Skin:    General: Skin is warm and dry.     Capillary Refill: Capillary refill takes less than 2 seconds.  Neurological:     General: No focal deficit present.     Mental Status: She is alert.  Psychiatric:        Mood and Affect: Mood normal.     (all labs ordered are listed, but only abnormal results are displayed) Labs Reviewed  BASIC METABOLIC PANEL WITH GFR - Abnormal; Notable for the following components:      Result Value   CO2 21 (*)    All other components within normal limits  CBC - Abnormal; Notable for the following components:   RBC 5.34 (*)    Hemoglobin 11.7 (*)    MCV 68.5 (*)    MCH 21.9 (*)    All other components within normal limits  PREGNANCY, URINE  D-DIMER, QUANTITATIVE  HEPATIC FUNCTION PANEL  LIPASE, BLOOD  TROPONIN T, HIGH SENSITIVITY    EKG: EKG Interpretation Date/Time:  Sunday June 14 2024 07:02:15 EDT Ventricular Rate:  87 PR Interval:  135 QRS Duration:  83 QT Interval:  363 QTC Calculation: 437 R Axis:   17  Text Interpretation: Sinus rhythm Confirmed by Ruthe Cornet [phone]) on 06/14/2024 7:09:04 AM  Radiology: DG Chest 2 View Result Date: 06/14/2024 EXAM: 2 VIEW(S) XRAY OF THE CHEST 06/14/2024 07:32:00 AM COMPARISON: 03/26/2015 CLINICAL HISTORY: Chest pain. Low back pain that has gravitated to upper back and radiates into chest, onset yesterday morning. FINDINGS: LUNGS AND PLEURA: No focal pulmonary opacity. No pulmonary edema. No pleural effusion. No pneumothorax. HEART AND MEDIASTINUM: No acute abnormality of the cardiac and mediastinal silhouettes. BONES AND SOFT TISSUES: No acute osseous abnormality. Surgical clips noted in the right upper quadrant of the abdomen. IMPRESSION: 1. No acute cardiopulmonary pathology. Electronically signed by: Waddell Calk MD 06/14/2024 07:35 AM EDT RP Workstation: [id number]     Procedures   Medications Ordered in the ED  ketorolac  (TORADOL ) 15 MG/ML injection 15 mg (has no  administration in time range)  acetaminophen  (TYLENOL ) tablet 1,000 mg (1,000 mg Oral Given 06/14/24 0720)                                    Medical Decision Making Amount  and/or Complexity of Data Reviewed Labs: ordered. Radiology: ordered.  Risk OTC drugs. Prescription drug management.   Jill Hobbs is here with chest pain.  Unremarkable vitals.  History of migraines gallbladder removal.  Differential diagnosis likely ongoing MSK related process but will evaluate for ACS PE pneumonia electrolyte abnormality anemia.  Will get CBC BMP troponin D-dimer hepatic function panel lipase.  EKG shows sinus rhythm.  No ischemic changes.  I reviewed interpreted EKG.  Per my review interpretation labs no significant leukocytosis anemia or electrolyte abnormality.  D-dimer normal.  Troponin normal.  Chest x-ray with no evidence of pneumonia pneumothorax.  Overall I do suspect that this is likely MSK related pain.  I do not think that this is a kidney stone or other acute process.  She is overall well-appearing.  She was told to return to the ED if symptoms worsen or if she developed a fever or other concerning symptoms.  Recommend continue supportive care at home.  Follow-up with primary care.  Discharge.  This chart was dictated using voice recognition software.  Despite best efforts to proofread,  errors can occur which can change the documentation meaning.      Final diagnoses:  Atypical chest pain    ED Discharge Orders     None          Ruthe Cornet, DO 06/14/24 9252

## 2024-06-14 NOTE — ED Triage Notes (Signed)
 Patient comes in with chest pain starting last night which she states does not radiate anywhere. She reports it is 6/10 pain. She reports back pain yesterday as well and this is 10/10. She reports no shortness of breath, diaphoresis, edema, or cardiac history.

## 2024-06-15 ENCOUNTER — Encounter: Payer: Self-pay | Admitting: Internal Medicine

## 2024-06-15 DIAGNOSIS — G8929 Other chronic pain: Secondary | ICD-10-CM

## 2024-06-15 DIAGNOSIS — M3500 Sicca syndrome, unspecified: Secondary | ICD-10-CM

## 2024-06-15 DIAGNOSIS — M2550 Pain in unspecified joint: Secondary | ICD-10-CM

## 2024-06-15 MED ORDER — PREDNISONE 10 MG PO TABS: ORAL_TABLET | ORAL | 0 refills | Status: DC

## 2024-06-15 MED ORDER — MELOXICAM 15 MG PO TABS: 15.0000 mg | ORAL_TABLET | Freq: Every day | ORAL | 0 refills | Status: DC | PRN

## 2024-06-15 NOTE — Telephone Encounter (Signed)
 I spoke with Jill Hobbs about increased pain particularly throughout her back that started since last week.  She was seen at the emergency department and treated with oral muscle relaxer and Toradol  injection.  So far symptoms have not gotten much better.  She is also not seeing relief with her usual 15 mg meloxicam .  She wonders if this could be inflammation related to her Sjogren syndrome. I discussed back pain would be unusual presentation for a flareup but cannot rule it out entirely.  Will try a short course of oral prednisone  tapering starting at 30 mg daily down over 9 days total.  We have a follow-up appointment later this month and can reassess symptoms at that time.

## 2024-06-15 NOTE — Telephone Encounter (Signed)
 Patient called the office referencing her message from this morning, patient would like a call back.

## 2024-06-17 NOTE — Progress Notes (Signed)
 Office Visit Note  Patient: Jill Hobbs             Date of Birth: 1986-06-14           MRN: [id number]             PCP: Bulah Alm RAMAN, PA-C Referring: Bulah Alm RAMAN, PA-C Visit Date: 06/29/2024   Subjective:  Follow-up    Discussed the use of AI scribe software for clinical note transcription with the patient, who gave verbal consent to proceed.  History of Present Illness   Jill Hobbs is a 38 year old female with Sjogren's syndrome who presents with worsening symptoms.  Over the past two weeks, she has experienced worsening symptoms related to her Sjogren's syndrome.  She was experiencing pain and swelling in the jaw and neck area.  Also developed symptoms of a sharp type of chest pain frequently worsened with deep inspiration or when trying to lie flat on her back.  Initially, prednisone  provided relief after three days of use, but she continues to experience significant dry eyes, dry mouth, and fatigue.  She describes new jaw discomfort, particularly when opening her mouth. No recent illness or episodes of her fingers turning blue or pale.  Her medical history includes positive blood tests for Sjogren's antibodies and a slightly elevated sedimentation rate. She previously used meloxicam  on a PRN basis but stopped a couple of months ago. A trial of hydroxychloroquine  was discontinued due to side effects, including nightmares or negative thoughts.  She recently removed her Mirena IUD and is considering having more children in the future.       Previous HPI 11/25/2023 Jill Hobbs is a 38 y.o. female here for follow up  for Sjogren's syndrome. She reports experiencing episodes of pain and swelling in the salivary gland areas, which they have been managing with meloxicam  and warm compressions. These episodes, which have occurred a few times over the past couple of years, typically resolve within a week.   The patient also reports significant dry  mouth, necessitating constant hydration, and dry eyes. Over-the-counter eye drops, recommended by their optometrist, have been effective in managing the dry eye symptoms. However, attempts to manage the dry mouth with sugar-free gum were unsuccessful due to resultant migraines.   The patient also reports occasional lower back pain but denies any new or worsening symptoms. They have not noticed any skin changes or rashes, nor any significant joint pain or stiffness. They have not experienced any noticeable lymph node swelling in the neck or under the armpits.     Previous HPI 08/06/2023 Jill Hobbs is a 38 y.o. female here for follow up for suspected Sjogren's syndrome.  Overall she is doing somewhat better since her last visit and is noticed improvement in her back and hip pain with the meloxicam .  She has been out of medication for 4 days and notices her symptoms are worse again when not on the medication.  Lab test at initial visit demonstrated highly positive Ro antibody and ANA titer and some persistent septation rate elevation at 28 though not as high as previously.  Rashes and eye and mouth dryness ongoing about the same.   Previous HPI 07/12/23 Jill Hobbs is a 38 y.o. female here for evaluation of positive ANA and chronic joint pain and stiffness especially in the low back and hips.  Problem has been ongoing for years previously saw a rheumatology years ago at that time the positive ANA was  negative on repeat testing in the rheumatology clinic and no specific autoimmune disease diagnosed.  Joint pain and stiffness is ongoing intermittently for 8 years.  Pain is most commonly at the low back or in the left hip occasionally affecting the right hip.  She experiences pain rating down the leg with some numbness and weakness that comes and goes occasionally.  Not associated with recurrent loss of balance or falling.  She sometimes experiences numbness and tingling in her hands lasting for  up to a few minutes but does not get a lot of joint pain or prolonged stiffness in upper extremities.  She never suffered any major joint injury such as fracture dislocation or required any surgery.  X-ray of lumbar spine was unremarkable.  MRI of her head for chronic headache last year was unremarkable.  She has taken over-the-counter medicine and was prescribed baclofen and gabapentin  for the hip pain did not see a great benefit so far. She has some new skin changes during the past year with hyperpigmentation occurring in a patchy distribution on her face and on the back of the neck.  Also with some new acne and skin bumps on the lower part of the face which is new for her.  Has not noticed any particular photosensitivity and no change on sun exposed areas on her torso and extremities. She has some chronic dry mouth and drinks water frequently for this.  No oral nasal ulcers does not notice any swelling around major salivary glands or cervical lymph nodes.  She notices some generalized hair thinning that is also worse in the past year not associated with any specific rash or lesions on the scalp. Denies any Raynaud's symptoms or abnormal bruising or bleeding. She has a family history of autoimmune disease father with multiple sclerosis and multiple grandparents with rheumatoid arthritis or lupus.     Labs reviewed 03/2023 ANA 1:40 speckled SSA (RDL) 155 RF neg CCP neg ESR 53 CRP 3 C3 235 C4 61   04/23/22 Xray Lumbar Spine Lumbar spine 2 views: No acute fractures.  No spondylolisthesis.  Disc space overall well-maintained.  No significant Riddick changes.  No bony  lesions.    Review of Systems  Constitutional:  Positive for fatigue.  HENT:  Positive for mouth dryness. Negative for mouth sores.   Eyes:  Positive for dryness.  Respiratory:  Negative for shortness of breath.   Cardiovascular:  Negative for chest pain and palpitations.  Gastrointestinal:  Negative for blood in stool,  constipation and diarrhea.  Endocrine: Negative for increased urination.  Genitourinary:  Negative for involuntary urination.  Musculoskeletal:  Positive for joint pain and joint pain. Negative for gait problem, joint swelling, myalgias, muscle weakness, morning stiffness, muscle tenderness and myalgias.  Skin:  Negative for color change, rash, hair loss and sensitivity to sunlight.  Allergic/Immunologic: Negative for susceptible to infections.  Neurological:  Positive for dizziness and headaches.  Hematological:  Negative for swollen glands.  Psychiatric/Behavioral:  Negative for depressed mood and sleep disturbance. The patient is not nervous/anxious.     PMFS History:  Patient Active Problem List   Diagnosis Date Noted   Low back pain 06/29/2024   Lateral pain of left hip 07/12/2023   Left-sided weakness 04/02/2022   Polyarthralgia 04/02/2022   Paresthesia 04/02/2022   Elevated sed rate 04/02/2022   Chronic low back pain with sciatica 04/02/2022   Family history of systemic lupus erythematosus 04/02/2022   Family history of rheumatoid arthritis 04/02/2022   Mixed hyperlipidemia  02/08/2022   GERD without esophagitis 02/08/2022   Thalassemia trait 01/03/2022   Abnormal biliary HIDA scan 10/03/2021   Class 1 obesity due to excess calories without serious comorbidity with body mass index (BMI) of 34.0 to 34.9 in adult 09/14/2021   Intractable episodic cluster headache 09/14/2021   Intractable migraine with status migrainosus 09/14/2021   Abdominal pain 09/06/2021   Loose stools 09/06/2021   Encounter for health maintenance examination in adult 07/03/2021   Screening for diabetes mellitus 07/03/2021   Vaccine counseling 07/03/2021   BMI 32.0-32.9,adult 07/03/2021   Beta-thalassemia (HCC) 07/03/2021   Screening for lipid disorders 07/03/2021   Need for Tdap vaccination 07/03/2021   Elevated LFTs 07/03/2021   Migraine without aura and without status migrainosus, not intractable  05/31/2020   Nausea 05/31/2020   Thalassemia 05/31/2020   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   CT abdomen shows normal liver 2018   Migraine    Positive ANA (antinuclear antibody)    resolved when she was evaluated by Rheumatology   Sjogren syndrome (HCC)    Vitamin D deficiency     Family History  Problem Relation Age of Onset   Thalassemia Mother    Multiple sclerosis Father    Thalassemia Brother    Heart disease Maternal Grandmother    Cancer Other        cervical in great grandmother   Stroke Neg Hx    Diabetes Neg Hx    Past Surgical History:  Procedure Laterality Date   CHOLECYSTECTOMY N/A 11/29/2021   Procedure: LAPAROSCOPIC CHOLECYSTECTOMY;  Surgeon: Signe Mitzie LABOR, MD;  Location: [facility];  Service: General;  Laterality: N/A;   WISDOM TOOTH EXTRACTION     Social History   Social History Narrative   Next month starts at a different substance abuse treatment center.   Was Tree surgeon at prior treatment facility.  Has degree in counseling.   Exercise - nothing.  06/2021   Right handed   Lives with her 2 sons   Caffeine : 2 energy drinks a week   Immunization History  Administered Date(s) Administered   DTaP 03/22/1986, 06/10/1986, 08/12/1986, 09/13/1987, 04/24/1990   Hepatitis B 03/01/1987, 08/24/1997, 09/28/1997   IPV 03/22/1986, 06/10/1986,  09/13/1987, 04/24/1990   Influenza,inj,Quad PF,6+ Mos 01/17/2022   Influenza-Unspecified 09/12/2023   MMR 05/31/1987, 05/28/1991   PFIZER(Purple Top)SARS-COV-2 Vaccination 10/12/2020, 11/02/2020, 12/24/2020   PPD Test 04/13/2011, 11/29/2022   Tdap 07/03/2021     Objective: Vital Signs: BP 108/73 (BP Location: Left Arm, Patient Position: Sitting, Cuff Size: Normal)   Pulse 91   Resp 16   Ht 5' 1 (1.549 m)   Wt 158 lb (71.7 kg)   LMP 06/24/2024   BMI 29.85 kg/m    Physical Exam Eyes:     Conjunctiva/sclera: Conjunctivae normal.  Neck:     Comments: Tenderness to pressure on right  side of the neck, some scattered small palpable lymph nodes bilaterally Cardiovascular:     Rate and Rhythm: Normal rate and regular rhythm.  Pulmonary:     Effort: Pulmonary effort is normal.     Breath sounds: Normal breath sounds.  Lymphadenopathy:     Cervical: Cervical adenopathy present.  Skin:    General: Skin is warm and dry.  Neurological:     Mental Status: She is alert.  Psychiatric:        Mood and Affect: Mood normal.      Musculoskeletal Exam:  Shoulders full ROM no tenderness or swelling Elbows full ROM no tenderness or swelling Wrists full ROM no tenderness or swelling Fingers full ROM no tenderness or swelling Knees full ROM no tenderness or swelling Ankles full ROM no tenderness or swelling    Investigation: No additional findings.  Imaging: DG Chest 2 View Result Date: 06/14/2024 EXAM: 2 VIEW(S) XRAY OF THE CHEST 06/14/2024 07:32:00 AM COMPARISON: 03/26/2015 CLINICAL HISTORY: Chest pain. Low back pain that has gravitated to upper back and radiates into chest, onset yesterday morning. FINDINGS: LUNGS AND PLEURA: No focal pulmonary opacity. No pulmonary edema. No pleural effusion. No pneumothorax. HEART AND MEDIASTINUM: No acute abnormality of the cardiac and mediastinal silhouettes. BONES AND SOFT TISSUES: No acute osseous abnormality. Surgical clips noted in the right  upper quadrant of the abdomen. IMPRESSION: 1. No acute cardiopulmonary pathology. Electronically signed by: Waddell Calk MD 06/14/2024 07:35 AM EDT RP Workstation: [id number]    Recent Labs: Lab Results  Component Value Date   WBC 5.6 06/14/2024   HGB 11.7 (L) 06/14/2024   PLT 334 06/14/2024   NA 137 06/14/2024   K 3.8 06/14/2024   CL 104 06/14/2024   CO2 21 (L) 06/14/2024   GLUCOSE 97 06/14/2024   BUN 10 06/14/2024   CREATININE 0.77 06/14/2024   BILITOT 0.3 06/14/2024   ALKPHOS 55 06/14/2024   AST 16 06/14/2024   ALT 18 06/14/2024   PROT 7.8 06/14/2024   ALBUMIN 4.6 06/14/2024   CALCIUM  9.8 06/14/2024   GFRAA >60 09/27/2017    Speciality Comments: No specialty comments available.  Procedures:  No procedures performed Allergies: Percocet [oxycodone -acetaminophen ] and Topamax  [topiramate ]   Assessment / Plan:     Visit Diagnoses: Sjogren syndrome, unspecified (HCC) Sjogren's syndrome with persistent sicca symptoms, fatigue, and recent jaw pain exacerbation. Not bad on exam today but was after treatment. Positive antibodies and elevated inflammatory markers. Previous prednisone  treatment effective. Hydroxychloroquine  discontinued due to nightmares. - Recheck blood tests for inflammatory markers also Ab markers for SLE given increased symptoms such as pleurisy. - Consider azathioprine if markers remain high, preference over methotrexate in this case due to possible pregnancy planning in future - Prescribe prednisone  to have on hand for a repeat flare-up, especially for upcoming wedding.  Lateral pain of left hip - Continue meloxicam  recommend she can just take it daily as needed   Orders: Orders Placed This Encounter  Procedures   Sedimentation rate   C-reactive protein   C3 and C4   Anti-DNA antibody, double-stranded   Meds ordered this encounter  Medications   predniSONE  (DELTASONE ) 10 MG tablet    Sig: Take 3 tablets (30 mg total) by mouth daily with breakfast  for 3 days, THEN 2 tablets (20 mg total) daily with breakfast for 3 days, THEN 1 tablet (10 mg total) daily with breakfast for 3 days. As needed for flare up.    Dispense:  18 tablet  Refill:  0   meloxicam  (MOBIC ) 15 MG tablet    Sig: Take 1 tablet (15 mg total) by mouth daily as needed for pain.    Dispense:  30 tablet    Refill:  2     Follow-Up Instructions: Return in about 6 months (around 12/30/2024) for pSS on NSAID f/u 6mos.   Lonni LELON Ester, MD  Note - This record has been created using [employer].  Chart creation errors have been sought, but may not always  have been located. Such creation errors do not reflect on  the standard of medical care.

## 2024-06-23 DIAGNOSIS — Z419 Encounter for procedure for purposes other than remedying health state, unspecified: Secondary | ICD-10-CM | POA: Diagnosis not present

## 2024-06-29 ENCOUNTER — Ambulatory Visit: Payer: Medicaid Other | Attending: Internal Medicine | Admitting: Internal Medicine

## 2024-06-29 ENCOUNTER — Encounter: Payer: Self-pay | Admitting: Internal Medicine

## 2024-06-29 VITALS — BP 108/73 | HR 91 | Resp 16 | Ht 61.0 in | Wt 158.0 lb

## 2024-06-29 DIAGNOSIS — M3500 Sicca syndrome, unspecified: Secondary | ICD-10-CM | POA: Diagnosis not present

## 2024-06-29 DIAGNOSIS — M2550 Pain in unspecified joint: Secondary | ICD-10-CM | POA: Diagnosis not present

## 2024-06-29 DIAGNOSIS — M545 Low back pain, unspecified: Secondary | ICD-10-CM | POA: Insufficient documentation

## 2024-06-29 DIAGNOSIS — M25552 Pain in left hip: Secondary | ICD-10-CM | POA: Diagnosis not present

## 2024-06-29 DIAGNOSIS — G8929 Other chronic pain: Secondary | ICD-10-CM

## 2024-06-29 DIAGNOSIS — Z79899 Other long term (current) drug therapy: Secondary | ICD-10-CM

## 2024-06-29 DIAGNOSIS — M5440 Lumbago with sciatica, unspecified side: Secondary | ICD-10-CM

## 2024-06-29 MED ORDER — MELOXICAM 15 MG PO TABS: 15.0000 mg | ORAL_TABLET | Freq: Every day | ORAL | 2 refills | Status: AC | PRN

## 2024-06-29 MED ORDER — PREDNISONE 10 MG PO TABS: ORAL_TABLET | ORAL | 0 refills | Status: AC

## 2024-06-29 NOTE — Patient Instructions (Signed)
 I recommend symptom treatments for eye dryness including lubricating eye drops and can use gel or ointment based products for overnight.  Also consider use of humidifier at night during dry weather.  Use follow-up regularly with your eye doctor.  If symptoms worsen there are several types of medicated eyedrop that can help with the dryness or inflammation.  For chronic dry mouth is important to stay well-hydrated.  You can also use sugar-free gum or lozenges to stimulate the saliva production.  Biotene mouthwash or lozenges may also be helpful.  Products into [employer] also work to stimulate saliva production.  Continue following up with your dentist regularly because dryness problems can increase the risk of tooth and gum decay.  If symptoms get worse there are medications for stimulating tear and saliva production but will try all the other options first.

## 2024-06-30 LAB — C3 AND C4
C3 Complement: 145 mg/dL (ref 83–193)
C4 Complement: 47 mg/dL (ref 15–57)

## 2024-06-30 LAB — ANTI-DNA ANTIBODY, DOUBLE-STRANDED: ds DNA Ab: <1 [IU]/mL

## 2024-06-30 LAB — SEDIMENTATION RATE: Sed Rate: 22 mm/h — ABNORMAL HIGH (ref 0–20)

## 2024-06-30 LAB — C-REACTIVE PROTEIN: CRP: <3 mg/L (ref <8.0)

## 2024-07-24 DIAGNOSIS — Z419 Encounter for procedure for purposes other than remedying health state, unspecified: Secondary | ICD-10-CM | POA: Diagnosis not present

## 2024-07-31 ENCOUNTER — Ambulatory Visit
Admission: RE | Admit: 2024-07-31 | Discharge: 2024-07-31 | Disposition: A | Discharge status: Home / Self Care | Source: Ambulatory Visit | Attending: Family Medicine | Admitting: Family Medicine

## 2024-07-31 VITALS — BP 102/71 | HR 83 | Temp 98.5°F | Resp 19

## 2024-07-31 DIAGNOSIS — G43001 Migraine without aura, not intractable, with status migrainosus: Secondary | ICD-10-CM | POA: Diagnosis not present

## 2024-07-31 MED ORDER — METOCLOPRAMIDE HCL 5 MG/ML IJ SOLN: 10.0000 mg | INTRAMUSCULAR | Status: DC

## 2024-07-31 MED ORDER — KETOROLAC TROMETHAMINE 60 MG/2ML IM SOLN: 60.0000 mg | Freq: Once | INTRAMUSCULAR | Status: DC

## 2024-07-31 NOTE — ED Triage Notes (Signed)
 [facility] to [facility] with [facility] migraine since yesterday with blurred vision, nausea, headache. Pt reports she has taken her migraine medication last dose 10;30

## 2024-07-31 NOTE — Discharge Instructions (Addendum)
 Encouraged to increase daily water intake to 64 ounces per day 7 days/week.  Advised if symptoms worsen and/or unresolved please follow-up with your PCP or here for further evaluation.

## 2024-07-31 NOTE — ED Provider Notes (Signed)
 Jill Hobbs    CSN: [id number] Arrival date & time: 07/31/24  1420      History   Chief Complaint Chief Complaint  Patient presents with   Migraine    Entered by patient    HPI Jill Hobbs is a 38 y.o. female.   HPI 38 year old female presents with migraine since yesterday.  Reports blurry vision, nausea, headache.  Reports taking last dose of migraine medication at 10:30 AM.  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   CT abdomen shows normal liver 2018   Migraine    Positive ANA (antinuclear antibody)    resolved when she was evaluated by Rheumatology   Sjogren syndrome (HCC)    Vitamin D deficiency     Patient Active Problem List   Diagnosis Date Noted   Low back pain 06/29/2024   Lateral pain of left hip 07/12/2023   Left-sided weakness 04/02/2022   Polyarthralgia 04/02/2022   Paresthesia 04/02/2022   Elevated sed rate 04/02/2022   Chronic low back pain with sciatica 04/02/2022   Family history of systemic lupus erythematosus 04/02/2022   Family history of rheumatoid arthritis 04/02/2022   Mixed hyperlipidemia 02/08/2022   GERD without esophagitis 02/08/2022   Thalassemia trait 01/03/2022   Abnormal biliary HIDA scan 10/03/2021   Class 1 obesity due to excess calories without serious comorbidity with body mass index (BMI) of 34.0 to 34.9 in adult 09/14/2021   Intractable episodic cluster headache 09/14/2021   Intractable migraine with status migrainosus 09/14/2021   Abdominal pain 09/06/2021   Loose stools 09/06/2021   Encounter for health maintenance examination in adult 07/03/2021   Screening for diabetes mellitus 07/03/2021   Vaccine counseling 07/03/2021   BMI 32.0-32.9,adult 07/03/2021   Beta-thalassemia (HCC) 07/03/2021   Screening for lipid disorders 07/03/2021    Need for Tdap vaccination 07/03/2021   Elevated LFTs 07/03/2021   Migraine without aura and without status migrainosus, not intractable 05/31/2020   Nausea 05/31/2020   Thalassemia 05/31/2020   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   Attention deficit hyperactivity disorder (ADHD) 05/31/2020   Sleep disturbance 05/31/2020   Insomnia 05/31/2020   Iron deficiency 05/31/2020   Vitamin D deficiency 05/31/2020   Microcytic anemia 03/13/2019   Snoring 03/13/2019   Menorrhagia with regular cycle 03/13/2019   Non-restorative sleep 03/13/2019   Obesity 09/21/2014   LFT elevation    Sjogren syndrome, unspecified (HCC)     Past Surgical History:  Procedure Laterality Date   CHOLECYSTECTOMY N/A 11/29/2021   Procedure: LAPAROSCOPIC CHOLECYSTECTOMY;  Surgeon: Signe Mitzie LABOR, MD;  Location: [facility];  Service: General;  Laterality: N/A;   WISDOM TOOTH EXTRACTION      OB History     Gravida  2   Para  2   Term  2   Preterm      AB      Living  2      SAB      IAB      Ectopic      Multiple      Live Births               Home Medications    Prior to Admission medications   Medication Sig Start Date End Date Taking? Authorizing Provider  amphetamine -dextroamphetamine (ADDERALL XR)  20 MG 24 hr capsule Take by mouth daily. 05/23/23   [provider]  butalbital -acetaminophen -caffeine  (FIORICET) 50-325-40 MG tablet TAKE 1 OR 2 TABLETS BY MOUTH EVERY SIX HOURS AS NEEDED FOR HEADACHE 05/20/24   Tysinger, Alm RAMAN, PA-C  Cholecalciferol 125 MCG (5000 UT) TABS 1 tablet Orally Once a day; Duration: 30 day(s) 12/16/23   [provider]  ferrous gluconate  (FERGON) 324 MG tablet Take 1 tablet (324 mg total) by mouth daily with breakfast. 12/04/23   Tysinger, Alm RAMAN, PA-C  levonorgestrel (MIRENA) 20 MCG/24HR IUD 1 each by Intrauterine route once. Patient not taking: Reported on 06/29/2024    [provider]  meloxicam  (MOBIC ) 15 MG tablet Take 1  tablet (15 mg total) by mouth daily as needed for pain. 06/29/24   Jeannetta Lonni ORN, MD  methocarbamol  (ROBAXIN ) 500 MG tablet TAKE ONE TABLET BY MOUTH TWICE DAILY AS NEEDED muscle SPASMS 06/12/23   Tysinger, Alm RAMAN, PA-C  ondansetron  (ZOFRAN ) 4 MG tablet Take 1 tablet (4 mg total) by mouth every 8 (eight) hours as needed for nausea or vomiting. 12/03/23   Tysinger, Alm RAMAN, PA-C  Rimegepant Sulfate (NURTEC) 75 MG TBDP Take 1 tablet (75 mg total) by mouth every other day. 05/20/24   Tysinger, Alm RAMAN, PA-C  SEMAGLUTIDE-WEIGHT MANAGEMENT [place] Inject 10 mg into the skin once a week.    [provider]  SUMAtriptan  (IMITREX ) 100 MG tablet Take 1 tablet (100 mg total) by mouth every 2 (two) hours as needed for migraine. May repeat in 2 hours if headache persists or recurs. 05/20/24   Tysinger, Alm RAMAN, PA-C    Family History Family History  Problem Relation Age of Onset   Thalassemia Mother    Multiple sclerosis Father    Thalassemia Brother    Heart disease Maternal Grandmother    Cancer Other        cervical in great grandmother   Stroke Neg Hx    Diabetes Neg Hx     Social History Social History   Tobacco Use   Smoking status: Never    Passive exposure: Never   Smokeless tobacco: Never  Vaping Use   Vaping status: Never Used  Substance Use Topics   Alcohol use: Yes    Comment: social   Drug use: Never     Allergies   Percocet [oxycodone -acetaminophen ] and Topamax  [topiramate ]   Review of Systems Review of Systems  Neurological:  Positive for headaches.  All other systems reviewed and are negative.    Physical Exam Triage Vital Signs ED Triage Vitals  Encounter Vitals Group     BP 07/31/24 1435 102/71     Girls Systolic BP Percentile --      Girls Diastolic BP Percentile --      Boys Systolic BP Percentile --      Boys Diastolic BP Percentile --      Pulse Rate 07/31/24 1435 83     Resp 07/31/24 1435 19     Temp 07/31/24 1435 98.5 F (36.9 C)     Temp  src --      SpO2 07/31/24 1435 98 %     Weight --      Height --      Head Circumference --      Peak Flow --      Pain Score 07/31/24 1434 7     Pain Loc --      Pain Education --      Exclude from  Growth Chart --    No data found.  Updated Vital Signs BP 102/71   Pulse 83   Temp 98.5 F (36.9 C)   Resp 19   LMP 07/23/2024   SpO2 98%    Physical Exam Vitals and nursing note reviewed.  Constitutional:      Appearance: Normal appearance. She is obese.  HENT:     Head: Normocephalic and atraumatic.     Right Ear: Tympanic membrane, ear canal and external ear normal.     Left Ear: Tympanic membrane, ear canal and external ear normal.     Mouth/Throat:     Mouth: Mucous membranes are moist.     Pharynx: Oropharynx is clear.  Eyes:     Extraocular Movements: Extraocular movements intact.     Conjunctiva/sclera: Conjunctivae normal.     Pupils: Pupils are equal, round, and reactive to light.  Cardiovascular:     Rate and Rhythm: Normal rate and regular rhythm.     Pulses: Normal pulses.     Heart sounds: Normal heart sounds.  Pulmonary:     Effort: Pulmonary effort is normal.     Breath sounds: Normal breath sounds. No wheezing, rhonchi or rales.  Musculoskeletal:        General: Normal range of motion.  Skin:    General: Skin is warm and dry.  Neurological:     General: No focal deficit present.     Mental Status: She is alert and oriented to person, place, and time. Mental status is at baseline.  Psychiatric:        Mood and Affect: Mood normal.        Behavior: Behavior normal.      UC Treatments / Results  Labs (all labs ordered are listed, but only abnormal results are displayed) Labs Reviewed - No data to display  EKG   Radiology No results found.  Procedures Procedures (including critical Hobbs time)  Medications Ordered in UC Medications  ketorolac  (TORADOL ) injection 60 mg (has no administration in time range)  metoCLOPramide  (REGLAN )  injection 10 mg (has no administration in time range)    Initial Impression / Assessment and Plan / UC Course  I have reviewed the triage vital signs and the nursing notes.  Pertinent labs & imaging results that were available during my Hobbs of the patient were reviewed by me and considered in my medical decision making (see chart for details).     MDM: 1.  Migraine without aura and without status migrainosus, not intractable-I am Toradol  60 mg given once in clinic, IM Reglan  10 mg given once in clinic. Encouraged to increase daily water intake to 64 ounces per day 7 days/week.  Advised if symptoms worsen and/or unresolved please follow-up with your PCP or here for further evaluation. Final Clinical Impressions(s) / UC Diagnoses   Final diagnoses:  Migraine without aura and with status migrainosus, not intractable     Discharge Instructions      Encouraged to increase daily water intake to 64 ounces per day 7 days/week.  Advised if symptoms worsen and/or unresolved please follow-up with your PCP or here for further evaluation.     ED Prescriptions   None    PDMP not reviewed this encounter.   Teddy Sharper, FNP 07/31/24 1515

## 2024-08-05 ENCOUNTER — Other Ambulatory Visit: Payer: Self-pay | Admitting: Medical

## 2024-08-05 MED ORDER — AIMOVIG 140 MG/ML ~~LOC~~ SOAJ: 140.0000 mg | SUBCUTANEOUS | 1 refills | Status: DC

## 2024-08-05 MED ORDER — AIMOVIG 140 MG/ML ~~LOC~~ SOAJ: 140.0000 mg | SUBCUTANEOUS | 1 refills | Status: AC

## 2024-08-05 NOTE — Addendum Note (Signed)
 Addended by: VICCI HUSBAND A on: 08/05/2024 04:43 PM   Modules accepted: Orders

## 2024-09-02 ENCOUNTER — Other Ambulatory Visit: Payer: Self-pay | Admitting: Medical

## 2024-09-02 ENCOUNTER — Telehealth: Payer: Self-pay | Admitting: Medical

## 2024-09-02 MED ORDER — BUTALBITAL-APAP-CAFFEINE 50-325-40 MG PO TABS: ORAL_TABLET | ORAL | 2 refills | Status: AC

## 2024-09-02 NOTE — Telephone Encounter (Signed)
 Pt requesting a refill on butalb-acetamin sent to  [facility] - [place], [place] - [id number]  [address]

## 2024-09-10 ENCOUNTER — Encounter: Payer: Self-pay | Admitting: Internal Medicine

## 2024-10-23 DIAGNOSIS — Z419 Encounter for procedure for purposes other than remedying health state, unspecified: Secondary | ICD-10-CM | POA: Diagnosis not present

## 2024-10-28 ENCOUNTER — Ambulatory Visit: Admitting: Medical

## 2024-11-30 ENCOUNTER — Telehealth: Payer: Self-pay | Admitting: Medical

## 2024-11-30 NOTE — Telephone Encounter (Signed)
 Fax from [facility]  Sumatriptan  succ 100 mg # 10  Last filled 05/20/24

## 2024-12-01 ENCOUNTER — Other Ambulatory Visit: Payer: Self-pay | Admitting: Medical

## 2024-12-01 MED ORDER — SUMATRIPTAN SUCCINATE 100 MG PO TABS: 100.0000 mg | ORAL_TABLET | ORAL | 2 refills | Status: AC | PRN

## 2024-12-17 NOTE — Assessment & Plan Note (Signed)
 SABRA

## 2024-12-17 NOTE — Progress Notes (Unsigned)
 "  Office Visit Note  Patient: Jill Hobbs             Date of Birth: Dec 15, 1985           MRN: [id number]             PCP: Bulah Alm RAMAN, PA-C Referring: Bulah Alm RAMAN, PA-C Visit Date: 12/30/2024   Subjective:  No chief complaint on file.   History of Present Illness: Jill Hobbs is a 39 y.o. female here for follow up with Sjogren's syndrome who presents with worsening symptoms.   Previous HPI 06/29/2024 Jill Hobbs is a 39 year old female with Sjogren's syndrome who presents with worsening symptoms.   Over the past two weeks, she has experienced worsening symptoms related to her Sjogren's syndrome.  She was experiencing pain and swelling in the jaw and neck area.  Also developed symptoms of a sharp type of chest pain frequently worsened with deep inspiration or when trying to lie flat on her back.  Initially, prednisone  provided relief after three days of use, but she continues to experience significant dry eyes, dry mouth, and fatigue.   She describes new jaw discomfort, particularly when opening her mouth. No recent illness or episodes of her fingers turning blue or pale.   Her medical history includes positive blood tests for Sjogren's antibodies and a slightly elevated sedimentation rate. She previously used meloxicam  on a PRN basis but stopped a couple of months ago. A trial of hydroxychloroquine  was discontinued due to side effects, including nightmares or negative thoughts.   She recently removed her Mirena IUD and is considering having more children in the future.         Previous HPI 11/25/2023 Jill Hobbs is a 39 y.o. female here for follow up  for Sjogren's syndrome. She reports experiencing episodes of pain and swelling in the salivary gland areas, which they have been managing with meloxicam  and warm compressions. These episodes, which have occurred a few times over the past couple of years, typically resolve within a week.   The  patient also reports significant dry mouth, necessitating constant hydration, and dry eyes. Over-the-counter eye drops, recommended by their optometrist, have been effective in managing the dry eye symptoms. However, attempts to manage the dry mouth with sugar-free gum were unsuccessful due to resultant migraines.   The patient also reports occasional lower back pain but denies any new or worsening symptoms. They have not noticed any skin changes or rashes, nor any significant joint pain or stiffness. They have not experienced any noticeable lymph node swelling in the neck or under the armpits.     Previous HPI 08/06/2023 Jill Hobbs is a 39 y.o. female here for follow up for suspected Sjogren's syndrome.  Overall she is doing somewhat better since her last visit and is noticed improvement in her back and hip pain with the meloxicam .  She has been out of medication for 4 days and notices her symptoms are worse again when not on the medication.  Lab test at initial visit demonstrated highly positive Ro antibody and ANA titer and some persistent septation rate elevation at 28 though not as high as previously.  Rashes and eye and mouth dryness ongoing about the same.   Previous HPI 07/12/23 Jill Hobbs is a 39 y.o. female here for evaluation of positive ANA and chronic joint pain and stiffness especially in the low back and hips.  Problem has been ongoing for years previously  saw a rheumatology years ago at that time the positive ANA was negative on repeat testing in the rheumatology clinic and no specific autoimmune disease diagnosed.  Joint pain and stiffness is ongoing intermittently for 8 years.  Pain is most commonly at the low back or in the left hip occasionally affecting the right hip.  She experiences pain rating down the leg with some numbness and weakness that comes and goes occasionally.  Not associated with recurrent loss of balance or falling.  She sometimes experiences numbness  and tingling in her hands lasting for up to a few minutes but does not get a lot of joint pain or prolonged stiffness in upper extremities.  She never suffered any major joint injury such as fracture dislocation or required any surgery.  X-ray of lumbar spine was unremarkable.  MRI of her head for chronic headache last year was unremarkable.  She has taken over-the-counter medicine and was prescribed baclofen and gabapentin  for the hip pain did not see a great benefit so far. She has some new skin changes during the past year with hyperpigmentation occurring in a patchy distribution on her face and on the back of the neck.  Also with some new acne and skin bumps on the lower part of the face which is new for her.  Has not noticed any particular photosensitivity and no change on sun exposed areas on her torso and extremities. She has some chronic dry mouth and drinks water frequently for this.  No oral nasal ulcers does not notice any swelling around major salivary glands or cervical lymph nodes.  She notices some generalized hair thinning that is also worse in the past year not associated with any specific rash or lesions on the scalp. Denies any Raynaud's symptoms or abnormal bruising or bleeding. She has a family history of autoimmune disease father with multiple sclerosis and multiple grandparents with rheumatoid arthritis or lupus.     Labs reviewed 03/2023 ANA 1:40 speckled SSA (RDL) 155 RF neg CCP neg ESR 53 CRP 3 C3 235 C4 61   04/23/22 Xray Lumbar Spine Lumbar spine 2 views: No acute fractures.  No spondylolisthesis.  Disc space overall well-maintained.  No significant Riddick changes.  No bony  lesions.    No Rheumatology ROS completed.   PMFS History:  Patient Active Problem List   Diagnosis Date Noted   Low back pain 06/29/2024   Lateral pain of left hip 07/12/2023   Left-sided weakness 04/02/2022   Polyarthralgia 04/02/2022   Paresthesia 04/02/2022   Elevated sed rate  04/02/2022   Chronic low back pain with sciatica 04/02/2022   Family history of systemic lupus erythematosus 04/02/2022   Family history of rheumatoid arthritis 04/02/2022   Mixed hyperlipidemia 02/08/2022   GERD without esophagitis 02/08/2022   Thalassemia trait 01/03/2022   Abnormal biliary HIDA scan 10/03/2021   Class 1 obesity due to excess calories without serious comorbidity with body mass index (BMI) of 34.0 to 34.9 in adult 09/14/2021   Intractable episodic cluster headache 09/14/2021   Intractable migraine with status migrainosus 09/14/2021   Abdominal pain 09/06/2021   Loose stools 09/06/2021   Encounter for health maintenance examination in adult 07/03/2021   Screening for diabetes mellitus 07/03/2021   Vaccine counseling 07/03/2021   BMI 32.0-32.9,adult 07/03/2021   Beta-thalassemia (HCC) 07/03/2021   Screening for lipid disorders 07/03/2021   Need for Tdap vaccination 07/03/2021   Elevated LFTs 07/03/2021   Migraine without aura and without status migrainosus, not intractable 05/31/2020  Nausea 05/31/2020   Thalassemia 05/31/2020   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   Attention deficit hyperactivity disorder (ADHD) 05/31/2020   Sleep disturbance 05/31/2020   Insomnia 05/31/2020   Iron deficiency 05/31/2020   Vitamin D deficiency 05/31/2020   Microcytic anemia 03/13/2019   Snoring 03/13/2019   Menorrhagia with regular cycle 03/13/2019   Non-restorative sleep 03/13/2019   Obesity 09/21/2014   LFT elevation    Sjogren syndrome, unspecified     Past Medical History:  Diagnosis Date   Beta thalassemia (HCC)    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   CT abdomen shows normal liver 2018   Migraine    Positive ANA (antinuclear antibody)    resolved when she was evaluated by Rheumatology   Sjogren syndrome    Vitamin D deficiency     Family  History  Problem Relation Age of Onset   Thalassemia Mother    Multiple sclerosis Father    Thalassemia Brother    Heart disease Maternal Grandmother    Cancer Other        cervical in great grandmother   Stroke Neg Hx    Diabetes Neg Hx    Past Surgical History:  Procedure Laterality Date   CHOLECYSTECTOMY N/A 11/29/2021   Procedure: LAPAROSCOPIC CHOLECYSTECTOMY;  Surgeon: Signe Mitzie LABOR, MD;  Location: [facility];  Service: General;  Laterality: N/A;   WISDOM TOOTH EXTRACTION     Social History   Social History Narrative   Next month starts at a different substance abuse treatment center.   Was tree surgeon at prior treatment facility.  Has degree in counseling.   Exercise - nothing.  06/2021   Right handed   Lives with her 2 sons   Caffeine : 2 energy drinks a week   Immunization History  Administered Date(s) Administered   DTaP 03/22/1986, 06/10/1986, 08/12/1986, 09/13/1987, 04/24/1990   Hepatitis B 03/01/1987, 08/24/1997, 09/28/1997   IPV 03/22/1986, 06/10/1986, 09/13/1987, 04/24/1990   Influenza,inj,Quad PF,6+ Mos 01/17/2022   Influenza-Unspecified 09/12/2023   MMR 05/31/1987, 05/28/1991   PFIZER(Purple Top)SARS-COV-2 Vaccination 10/12/2020, 11/02/2020, 12/24/2020   PPD Test 04/13/2011, 11/29/2022   Tdap 07/03/2021     Objective: Vital Signs: There were no vitals taken for this visit.   Physical Exam   Musculoskeletal Exam: ***   Investigation: No additional findings.  Imaging: No results found.  Recent Labs: Lab Results  Component Value Date   WBC 5.6 06/14/2024   HGB 11.7 (L) 06/14/2024   PLT 334 06/14/2024   NA 137 06/14/2024   K 3.8 06/14/2024   CL 104 06/14/2024   CO2 21 (L) 06/14/2024   GLUCOSE 97 06/14/2024   BUN 10 06/14/2024   CREATININE 0.77 06/14/2024   BILITOT 0.3 06/14/2024   ALKPHOS 55 06/14/2024   AST 16 06/14/2024   ALT 18 06/14/2024   PROT 7.8 06/14/2024   ALBUMIN 4.6 06/14/2024   CALCIUM  9.8 06/14/2024   GFRAA >60  09/27/2017    Speciality Comments: No specialty comments available.  Procedures:  No procedures performed Allergies: Percocet [oxycodone -acetaminophen ] and Topamax  [topiramate ]   Assessment / Plan:     Visit Diagnoses:  Assessment & Plan Sjogren syndrome, unspecified     Lateral pain of left hip      ***  Follow-Up Instructions: No follow-ups on file.   Machelle Raybon M Lyndall Windt, CMA  Note - This record has been  created using [employer].  Chart creation errors have been sought, but may not always  have been located. Such creation errors do not reflect on  the standard of medical care. "

## 2024-12-30 ENCOUNTER — Ambulatory Visit: Admitting: Internal Medicine

## 2024-12-30 DIAGNOSIS — M25552 Pain in left hip: Secondary | ICD-10-CM

## 2024-12-30 DIAGNOSIS — M3500 Sicca syndrome, unspecified: Secondary | ICD-10-CM
